# Patient Record
Sex: Male | Born: 1980 | Race: White | Hispanic: No | Marital: Married | Smoking: Never smoker
Health system: Southern US, Community
[De-identification: ages and names within clinical notes are randomized; demographics above are authoritative.]

## PROBLEM LIST (undated history)

## (undated) DIAGNOSIS — H539 Unspecified visual disturbance: Secondary | ICD-10-CM

## (undated) DIAGNOSIS — I498 Other specified cardiac arrhythmias: Secondary | ICD-10-CM

## (undated) DIAGNOSIS — R55 Syncope and collapse: Secondary | ICD-10-CM

## (undated) DIAGNOSIS — C6290 Malignant neoplasm of unspecified testis, unspecified whether descended or undescended: Secondary | ICD-10-CM

## (undated) DIAGNOSIS — S060XAA Concussion with loss of consciousness status unknown, initial encounter: Secondary | ICD-10-CM

## (undated) DIAGNOSIS — R109 Unspecified abdominal pain: Secondary | ICD-10-CM

## (undated) DIAGNOSIS — G90A Postural orthostatic tachycardia syndrome (POTS): Secondary | ICD-10-CM

## (undated) DIAGNOSIS — R569 Unspecified convulsions: Secondary | ICD-10-CM

## (undated) DIAGNOSIS — R110 Nausea: Secondary | ICD-10-CM

## (undated) DIAGNOSIS — G971 Other reaction to spinal and lumbar puncture: Secondary | ICD-10-CM

## (undated) DIAGNOSIS — R200 Anesthesia of skin: Secondary | ICD-10-CM

## (undated) DIAGNOSIS — I959 Hypotension, unspecified: Secondary | ICD-10-CM

## (undated) DIAGNOSIS — A879 Viral meningitis, unspecified: Secondary | ICD-10-CM

## (undated) DIAGNOSIS — S060X9A Concussion with loss of consciousness of unspecified duration, initial encounter: Secondary | ICD-10-CM

## (undated) DIAGNOSIS — G43909 Migraine, unspecified, not intractable, without status migrainosus: Secondary | ICD-10-CM

## (undated) HISTORY — DX: Unspecified abdominal pain: R10.9

## (undated) HISTORY — PX: ORCHIECTOMY: SHX2116

## (undated) HISTORY — DX: Postural orthostatic tachycardia syndrome (POTS): G90.A

## (undated) HISTORY — PX: ROTATOR CUFF REPAIR: SHX139

## (undated) HISTORY — DX: Other specified cardiac arrhythmias: I49.8

---
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
## 2000-12-23 ENCOUNTER — Emergency Department (HOSPITAL_COMMUNITY): Admission: EM | Admit: 2000-12-23 | Discharge: 2000-12-23 | Payer: Self-pay | Admitting: Emergency Medicine

## 2004-01-21 ENCOUNTER — Emergency Department (HOSPITAL_COMMUNITY): Admission: EM | Admit: 2004-01-21 | Discharge: 2004-01-21 | Payer: Self-pay | Admitting: Family Medicine

## 2004-04-05 ENCOUNTER — Encounter: Admission: RE | Admit: 2004-04-05 | Discharge: 2004-04-05 | Payer: Self-pay | Admitting: Endocrinology

## 2004-09-02 DIAGNOSIS — C6290 Malignant neoplasm of unspecified testis, unspecified whether descended or undescended: Secondary | ICD-10-CM

## 2004-09-02 HISTORY — PX: SURGERY SCROTAL / TESTICULAR: SUR1316

## 2004-09-02 HISTORY — DX: Malignant neoplasm of unspecified testis, unspecified whether descended or undescended: C62.90

## 2004-11-12 ENCOUNTER — Ambulatory Visit (HOSPITAL_BASED_OUTPATIENT_CLINIC_OR_DEPARTMENT_OTHER): Admission: RE | Admit: 2004-11-12 | Discharge: 2004-11-12 | Payer: Self-pay | Admitting: Urology

## 2004-11-12 ENCOUNTER — Encounter (INDEPENDENT_AMBULATORY_CARE_PROVIDER_SITE_OTHER): Payer: Self-pay | Admitting: *Deleted

## 2004-11-16 ENCOUNTER — Ambulatory Visit: Admission: RE | Admit: 2004-11-16 | Discharge: 2005-01-17 | Payer: Self-pay | Admitting: Radiation Oncology

## 2004-12-04 ENCOUNTER — Ambulatory Visit: Payer: Self-pay | Admitting: Hematology & Oncology

## 2005-02-07 ENCOUNTER — Ambulatory Visit: Admission: RE | Admit: 2005-02-07 | Discharge: 2005-02-07 | Payer: Self-pay | Admitting: Radiation Oncology

## 2005-02-08 ENCOUNTER — Ambulatory Visit (HOSPITAL_COMMUNITY): Admission: RE | Admit: 2005-02-08 | Discharge: 2005-02-08 | Payer: Self-pay | Admitting: Urology

## 2005-04-08 ENCOUNTER — Ambulatory Visit (HOSPITAL_COMMUNITY): Admission: RE | Admit: 2005-04-08 | Discharge: 2005-04-08 | Payer: Self-pay | Admitting: Urology

## 2005-04-08 ENCOUNTER — Ambulatory Visit (HOSPITAL_BASED_OUTPATIENT_CLINIC_OR_DEPARTMENT_OTHER): Admission: RE | Admit: 2005-04-08 | Discharge: 2005-04-08 | Payer: Self-pay | Admitting: Urology

## 2005-05-18 ENCOUNTER — Emergency Department (HOSPITAL_COMMUNITY): Admission: EM | Admit: 2005-05-18 | Discharge: 2005-05-18 | Payer: Self-pay | Admitting: Emergency Medicine

## 2005-06-10 ENCOUNTER — Ambulatory Visit (HOSPITAL_COMMUNITY): Admission: RE | Admit: 2005-06-10 | Discharge: 2005-06-10 | Payer: Self-pay | Admitting: Urology

## 2005-08-20 ENCOUNTER — Ambulatory Visit (HOSPITAL_COMMUNITY): Admission: RE | Admit: 2005-08-20 | Discharge: 2005-08-20 | Payer: Self-pay | Admitting: Urology

## 2005-09-26 ENCOUNTER — Ambulatory Visit (HOSPITAL_COMMUNITY): Admission: RE | Admit: 2005-09-26 | Discharge: 2005-09-26 | Payer: Self-pay | Admitting: Urology

## 2005-10-30 ENCOUNTER — Ambulatory Visit (HOSPITAL_BASED_OUTPATIENT_CLINIC_OR_DEPARTMENT_OTHER): Admission: RE | Admit: 2005-10-30 | Discharge: 2005-10-30 | Payer: Self-pay | Admitting: Urology

## 2006-01-24 ENCOUNTER — Encounter: Admission: RE | Admit: 2006-01-24 | Discharge: 2006-01-24 | Payer: Self-pay | Admitting: Gastroenterology

## 2006-02-19 ENCOUNTER — Ambulatory Visit (HOSPITAL_COMMUNITY): Admission: RE | Admit: 2006-02-19 | Discharge: 2006-02-19 | Payer: Self-pay | Admitting: Urology

## 2006-02-26 ENCOUNTER — Ambulatory Visit: Payer: Self-pay | Admitting: Oncology

## 2006-03-07 LAB — CBC WITH DIFFERENTIAL/PLATELET
BASO%: 0.8 % (ref 0.0–2.0)
Basophils Absolute: 0 10*3/uL (ref 0.0–0.1)
EOS%: 7.3 % — ABNORMAL HIGH (ref 0.0–7.0)
Eosinophils Absolute: 0.3 10*3/uL (ref 0.0–0.5)
HCT: 42.1 % (ref 38.7–49.9)
HGB: 14.5 g/dL (ref 13.0–17.1)
LYMPH%: 27.6 % (ref 14.0–48.0)
MCH: 30.6 pg (ref 28.0–33.4)
MCHC: 34.4 g/dL (ref 32.0–35.9)
MCV: 88.9 fL (ref 81.6–98.0)
MONO#: 0.4 10*3/uL (ref 0.1–0.9)
MONO%: 8.2 % (ref 0.0–13.0)
NEUT#: 2.5 10*3/uL (ref 1.5–6.5)
NEUT%: 56.1 % (ref 40.0–75.0)
Platelets: 255 10*3/uL (ref 145–400)
RBC: 4.74 10*6/uL (ref 4.20–5.71)
RDW: 12.9 % (ref 11.2–14.6)
WBC: 4.4 10*3/uL (ref 4.0–10.0)
lymph#: 1.2 10*3/uL (ref 0.9–3.3)

## 2006-03-07 LAB — COMPREHENSIVE METABOLIC PANEL
ALT: 22 U/L (ref 0–40)
AST: 17 U/L (ref 0–37)
Albumin: 4.5 g/dL (ref 3.5–5.2)
Alkaline Phosphatase: 68 U/L (ref 39–117)
BUN: 24 mg/dL — ABNORMAL HIGH (ref 6–23)
CO2: 27 mEq/L (ref 19–32)
Calcium: 9.3 mg/dL (ref 8.4–10.5)
Chloride: 103 mEq/L (ref 96–112)
Creatinine, Ser: 0.96 mg/dL (ref 0.40–1.50)
Glucose, Bld: 83 mg/dL (ref 70–99)
Potassium: 4.4 mEq/L (ref 3.5–5.3)
Sodium: 138 mEq/L (ref 135–145)
Total Bilirubin: 0.8 mg/dL (ref 0.3–1.2)
Total Protein: 7.1 g/dL (ref 6.0–8.3)

## 2006-03-07 LAB — LACTATE DEHYDROGENASE: LDH: 111 U/L (ref 94–250)

## 2006-03-10 ENCOUNTER — Ambulatory Visit (HOSPITAL_COMMUNITY): Admission: RE | Admit: 2006-03-10 | Discharge: 2006-03-10 | Payer: Self-pay | Admitting: Oncology

## 2006-06-04 ENCOUNTER — Ambulatory Visit: Payer: Self-pay | Admitting: Oncology

## 2006-06-06 ENCOUNTER — Ambulatory Visit (HOSPITAL_COMMUNITY): Admission: RE | Admit: 2006-06-06 | Discharge: 2006-06-06 | Payer: Self-pay | Admitting: Oncology

## 2006-08-20 ENCOUNTER — Ambulatory Visit (HOSPITAL_COMMUNITY): Admission: RE | Admit: 2006-08-20 | Discharge: 2006-08-20 | Payer: Self-pay | Admitting: Oncology

## 2006-09-02 ENCOUNTER — Ambulatory Visit: Payer: Self-pay | Admitting: Oncology

## 2006-09-30 LAB — CBC WITH DIFFERENTIAL/PLATELET
BASO%: 0.4 % (ref 0.0–2.0)
Basophils Absolute: 0 10*3/uL (ref 0.0–0.1)
EOS%: 5.9 % (ref 0.0–7.0)
Eosinophils Absolute: 0.3 10*3/uL (ref 0.0–0.5)
HCT: 41.7 % (ref 38.7–49.9)
HGB: 14.1 g/dL (ref 13.0–17.1)
LYMPH%: 34.7 % (ref 14.0–48.0)
MCH: 30.4 pg (ref 28.0–33.4)
MCHC: 33.9 g/dL (ref 32.0–35.9)
MCV: 89.8 fL (ref 81.6–98.0)
MONO#: 0.4 10*3/uL (ref 0.1–0.9)
MONO%: 6.8 % (ref 0.0–13.0)
NEUT#: 2.8 10*3/uL (ref 1.5–6.5)
NEUT%: 52.2 % (ref 40.0–75.0)
Platelets: 223 10*3/uL (ref 145–400)
RBC: 4.64 10*6/uL (ref 4.20–5.71)
RDW: 12.7 % (ref 11.2–14.6)
WBC: 5.3 10*3/uL (ref 4.0–10.0)
lymph#: 1.8 10*3/uL (ref 0.9–3.3)

## 2006-10-03 LAB — COMPREHENSIVE METABOLIC PANEL
ALT: 21 U/L (ref 0–53)
AST: 16 U/L (ref 0–37)
Albumin: 4.6 g/dL (ref 3.5–5.2)
Alkaline Phosphatase: 62 U/L (ref 39–117)
BUN: 21 mg/dL (ref 6–23)
CO2: 27 mEq/L (ref 19–32)
Calcium: 9.1 mg/dL (ref 8.4–10.5)
Chloride: 101 mEq/L (ref 96–112)
Creatinine, Ser: 1.24 mg/dL (ref 0.40–1.50)
Glucose, Bld: 78 mg/dL (ref 70–99)
Potassium: 4 mEq/L (ref 3.5–5.3)
Sodium: 135 mEq/L (ref 135–145)
Total Bilirubin: 0.9 mg/dL (ref 0.3–1.2)
Total Protein: 6.8 g/dL (ref 6.0–8.3)

## 2006-10-03 LAB — BETA HCG QUANT (REF LAB): Beta hCG, Tumor Marker: <0.5 m[IU]/mL (ref <5.0)

## 2006-10-03 LAB — LACTATE DEHYDROGENASE: LDH: 109 U/L (ref 94–250)

## 2006-10-07 ENCOUNTER — Ambulatory Visit: Payer: Self-pay | Admitting: Oncology

## 2007-08-13 ENCOUNTER — Ambulatory Visit: Payer: Self-pay | Admitting: Oncology

## 2007-08-17 LAB — CBC WITH DIFFERENTIAL/PLATELET
BASO%: 1 % (ref 0.0–2.0)
Basophils Absolute: 0.1 10*3/uL (ref 0.0–0.1)
EOS%: 4.8 % (ref 0.0–7.0)
Eosinophils Absolute: 0.3 10*3/uL (ref 0.0–0.5)
HCT: 39.1 % (ref 38.7–49.9)
HGB: 13.7 g/dL (ref 13.0–17.1)
LYMPH%: 29.8 % (ref 14.0–48.0)
MCH: 30.6 pg (ref 28.0–33.4)
MCHC: 35 g/dL (ref 32.0–35.9)
MCV: 87.6 fL (ref 81.6–98.0)
MONO#: 0.3 10*3/uL (ref 0.1–0.9)
MONO%: 6.1 % (ref 0.0–13.0)
NEUT#: 3.3 10*3/uL (ref 1.5–6.5)
NEUT%: 58.3 % (ref 40.0–75.0)
Platelets: 239 10*3/uL (ref 145–400)
RBC: 4.46 10*6/uL (ref 4.20–5.71)
RDW: 12.9 % (ref 11.2–14.6)
WBC: 5.6 10*3/uL (ref 4.0–10.0)
lymph#: 1.7 10*3/uL (ref 0.9–3.3)

## 2007-08-19 LAB — COMPREHENSIVE METABOLIC PANEL
ALT: 16 U/L (ref 0–53)
AST: 16 U/L (ref 0–37)
Albumin: 4.5 g/dL (ref 3.5–5.2)
Alkaline Phosphatase: 61 U/L (ref 39–117)
BUN: 23 mg/dL (ref 6–23)
CO2: 27 mEq/L (ref 19–32)
Calcium: 9.3 mg/dL (ref 8.4–10.5)
Chloride: 103 mEq/L (ref 96–112)
Creatinine, Ser: 0.87 mg/dL (ref 0.40–1.50)
Glucose, Bld: 118 mg/dL — ABNORMAL HIGH (ref 70–99)
Potassium: 4.1 mEq/L (ref 3.5–5.3)
Sodium: 140 mEq/L (ref 135–145)
Total Bilirubin: 0.7 mg/dL (ref 0.3–1.2)
Total Protein: 7 g/dL (ref 6.0–8.3)

## 2007-08-19 LAB — LACTATE DEHYDROGENASE: LDH: 108 U/L (ref 94–250)

## 2007-08-19 LAB — BETA HCG QUANT (REF LAB): Beta hCG, Tumor Marker: <0.5 m[IU]/mL (ref <5.0)

## 2007-08-24 ENCOUNTER — Ambulatory Visit (HOSPITAL_COMMUNITY): Admission: RE | Admit: 2007-08-24 | Discharge: 2007-08-24 | Payer: Self-pay | Admitting: Oncology

## 2008-07-21 ENCOUNTER — Ambulatory Visit: Payer: Self-pay | Admitting: Oncology

## 2008-07-21 ENCOUNTER — Ambulatory Visit (HOSPITAL_COMMUNITY): Admission: RE | Admit: 2008-07-21 | Discharge: 2008-07-21 | Payer: Self-pay | Admitting: Oncology

## 2008-07-21 LAB — COMPREHENSIVE METABOLIC PANEL
ALT: 25 U/L (ref 0–53)
AST: 21 U/L (ref 0–37)
Albumin: 4.5 g/dL (ref 3.5–5.2)
Alkaline Phosphatase: 60 U/L (ref 39–117)
BUN: 22 mg/dL (ref 6–23)
CO2: 30 mEq/L (ref 19–32)
Calcium: 9.3 mg/dL (ref 8.4–10.5)
Chloride: 101 mEq/L (ref 96–112)
Creatinine, Ser: 0.89 mg/dL (ref 0.40–1.50)
Glucose, Bld: 100 mg/dL — ABNORMAL HIGH (ref 70–99)
Potassium: 3.9 mEq/L (ref 3.5–5.3)
Sodium: 138 mEq/L (ref 135–145)
Total Bilirubin: 1.1 mg/dL (ref 0.3–1.2)
Total Protein: 7.2 g/dL (ref 6.0–8.3)

## 2008-07-21 LAB — CBC WITH DIFFERENTIAL/PLATELET
BASO%: 0.4 % (ref 0.0–2.0)
Basophils Absolute: 0 10*3/uL (ref 0.0–0.1)
EOS%: 4.3 % (ref 0.0–7.0)
Eosinophils Absolute: 0.2 10*3/uL (ref 0.0–0.5)
HCT: 43 % (ref 38.7–49.9)
HGB: 14.6 g/dL (ref 13.0–17.1)
LYMPH%: 30.6 % (ref 14.0–48.0)
MCH: 30.4 pg (ref 28.0–33.4)
MCHC: 34 g/dL (ref 32.0–35.9)
MCV: 89.3 fL (ref 81.6–98.0)
MONO#: 0.4 10*3/uL (ref 0.1–0.9)
MONO%: 6.4 % (ref 0.0–13.0)
NEUT#: 3.3 10*3/uL (ref 1.5–6.5)
NEUT%: 58.3 % (ref 40.0–75.0)
Platelets: 234 10*3/uL (ref 145–400)
RBC: 4.81 10*6/uL (ref 4.20–5.71)
RDW: 13 % (ref 11.2–14.6)
WBC: 5.7 10*3/uL (ref 4.0–10.0)
lymph#: 1.7 10*3/uL (ref 0.9–3.3)

## 2008-07-21 LAB — LACTATE DEHYDROGENASE: LDH: 105 U/L (ref 94–250)

## 2008-07-25 LAB — BETA HCG QUANT (REF LAB): Beta hCG, Tumor Marker: <0.5 m[IU]/mL (ref <5.0)

## 2010-05-09 ENCOUNTER — Ambulatory Visit (HOSPITAL_COMMUNITY): Admission: RE | Admit: 2010-05-09 | Discharge: 2010-05-09 | Payer: Self-pay | Admitting: Oncology

## 2010-05-09 ENCOUNTER — Ambulatory Visit: Payer: Self-pay | Admitting: Oncology

## 2010-05-09 LAB — CBC WITH DIFFERENTIAL/PLATELET
BASO%: 0.6 % (ref 0.0–2.0)
Basophils Absolute: 0 10*3/uL (ref 0.0–0.1)
EOS%: 3.9 % (ref 0.0–7.0)
Eosinophils Absolute: 0.2 10*3/uL (ref 0.0–0.5)
HCT: 43.8 % (ref 38.4–49.9)
HGB: 14.6 g/dL (ref 13.0–17.1)
LYMPH%: 28.6 % (ref 14.0–49.0)
MCH: 29.9 pg (ref 27.2–33.4)
MCHC: 33.3 g/dL (ref 32.0–36.0)
MCV: 89.6 fL (ref 79.3–98.0)
MONO#: 0.3 10*3/uL (ref 0.1–0.9)
MONO%: 5.9 % (ref 0.0–14.0)
NEUT#: 3.5 10*3/uL (ref 1.5–6.5)
NEUT%: 61 % (ref 39.0–75.0)
Platelets: 257 10*3/uL (ref 140–400)
RBC: 4.89 10*6/uL (ref 4.20–5.82)
RDW: 13.3 % (ref 11.0–14.6)
WBC: 5.7 10*3/uL (ref 4.0–10.3)
lymph#: 1.6 10*3/uL (ref 0.9–3.3)

## 2010-05-11 LAB — COMPREHENSIVE METABOLIC PANEL
ALT: 22 U/L (ref 0–53)
AST: 16 U/L (ref 0–37)
Albumin: 4.5 g/dL (ref 3.5–5.2)
Alkaline Phosphatase: 76 U/L (ref 39–117)
BUN: 21 mg/dL (ref 6–23)
CO2: 30 mEq/L (ref 19–32)
Calcium: 9.5 mg/dL (ref 8.4–10.5)
Chloride: 102 mEq/L (ref 96–112)
Creatinine, Ser: 0.99 mg/dL (ref 0.40–1.50)
Glucose, Bld: 64 mg/dL — ABNORMAL LOW (ref 70–99)
Potassium: 4.5 mEq/L (ref 3.5–5.3)
Sodium: 139 mEq/L (ref 135–145)
Total Bilirubin: 1.1 mg/dL (ref 0.3–1.2)
Total Protein: 6.9 g/dL (ref 6.0–8.3)

## 2010-05-11 LAB — BETA HCG QUANT (REF LAB): Beta hCG, Tumor Marker: <0.5 m[IU]/mL (ref <5.0)

## 2010-05-11 LAB — LACTATE DEHYDROGENASE: LDH: 121 U/L (ref 94–250)

## 2010-09-23 ENCOUNTER — Encounter: Payer: Self-pay | Admitting: Family Medicine

## 2010-09-23 ENCOUNTER — Encounter: Payer: Self-pay | Admitting: Oncology

## 2011-01-18 NOTE — Op Note (Signed)
NAME:  Manuel [name], Manuel [name]              ACCOUNT NO.:  [id number]   MEDICAL RECORD NO.:  [id number]          PATIENT TYPE:  AMB   LOCATION:  [facility]                         FACILITY:  [facility]   PHYSICIAN:  Valetta Fuller, M.D.  DATE OF BIRTH:  04-Oct-1980   DATE OF PROCEDURE:  11/12/2004  DATE OF DISCHARGE:                                 OPERATIVE REPORT   PREOPERATIVE DIAGNOSIS:  Left testicular tumor.   POSTOPERATIVE DIAGNOSIS:  Left testicular tumor.   PROCEDURE PERFORMED:  Left radical orchiectomy.   SURGEON:  Valetta Fuller, M.D.   ASSISTANT:  Thyra Breed, MD   ANESTHESIA:  General.   INDICATIONS:  Mr. Furey is a 30 year old male.  He has been seen in our  office with other issues, but presented recently with approximately a 4-6  week history of some intermittent testicular achiness and soreness.  He had  no other systemic complaints, and no voiding symptoms.  His urinalysis was  clear, and clinical examination revealed a testicle that was fairly normal  size; without a discrete mass.  But, the consistency of the testicle was  quite hard and clearly abnormal.  Scrotal ultrasound was done, which showed  what appeared to be a relatively well circumscribed mass essentially  replacing the great majority of the testicle; with only a thin rim of what  appeared to be normal testicular parenchyma.  Alpha fetoprotein was not  elevated, and beta HCG was very minimally elevated.   The patient was flet to have a high likelihood of a germ cell neoplasm of  the testis.  We recommended left radical orchiectomy, given the severity of  the abnormalities on clinical examination; as well as on ultrasound.   The patient is scheduled for imaging studies, with a CT scan of the abdomen,  pelvis and chest in the next few days.  That advice was given to the patient  and his family, and accepted.  Full informed consent was obtained.  The  patient also was told the pros and cons of testicular prosthesis  insertion,  and elected to have that done as well.   TECHNIQUE AND FINDINGS:  The patient was brought to the operating room,  where he had successful induction of general anesthesia.  He was placed in  the supine position and prepped and draped in the usual manner.  A standard small left anal incision was made.  Electrocautery was used to  dissect down to the external oblique fascia, where the external ring was  easily identified.  The external oblique was then opened in the direction of  its fibers.  The underlying nerve was identified and preserved.  Spermatic  cord appeared otherwise unremarkable.  At the level of the pubic tubercle it  was encircled with a Penrose.  We then dissected back proximally to the  internal ring, where the Penrose drain was doubly wrapped around the  spermatic cord and occluded.  The scrotum was then everted and the testicle  was freed from gubernacular attachments.  Again, the testis itself was  markedly hard without obvious palpable discrete mass per se.  We  did not  feel that additional exploration or biopsy of the testicle was indicated,  and felt that it was best to leave the coverings over the testicle and  simply remove it.  We traced the spermatic cord down to the internal ring.  There it was taken in two bundles, with silk ligature and tie.  The silk  suture was left  long for possible identification at a later date.  The  inguinal wound was copiously irrigated.  The external oblique was closed  with a running Vicryl suture.   We then went ahead and placed a medium size saline prosthesis, which filled  in the standard manner.  It was anchored to the dependent portion of the  scrotum, utilizing the PDS suture.  Again, copious irrigation was performed.  Subcutaneous tissues, including Scarpa's fascia, were then closed with a  running Vicryl.  The skin was closed with a subcuticular and Dermabond.   The patient appeared to tolerate the procedure and  there were no obvious  complications.      DSG/MEDQ  D:  11/13/2004  T:  11/13/2004  Job:  [id number]

## 2011-01-18 NOTE — Op Note (Signed)
NAME:  Manuel [name], WYSS              ACCOUNT NO.:  [id number]   MEDICAL RECORD NO.:  [id number]          PATIENT TYPE:  AMB   LOCATION:  [facility]                         FACILITY:  [facility]   PHYSICIAN:  Valetta Fuller, M.D.  DATE OF BIRTH:  October 10, 1980   DATE OF PROCEDURE:  10/30/2005  DATE OF DISCHARGE:                                 OPERATIVE REPORT   PREOPERATIVE DIAGNOSIS:  Request for removal of testicular prosthesis.   POSTOPERATIVE DIAGNOSIS:  Request for removal of testicular prosthesis.   PROCEDURE PERFORMED:  Removal of testicular prosthesis.   INDICATIONS:  Mr. Phenix is a 30 year old male.  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  At that time, a testicular  prosthesis was inserted.  In August, 2006, this was repositioned because of  the high-riding nature of the prosthesis.  The patient came in recently  asking that the prosthesis be removed.  He has had no major problems with  the prosthesis but finds it to be somewhat uncomfortable when he is active  with hiking and mountain biking.  At this point, he feels like mentally he  does not require a prosthesis and would just as soon have it removed.  He is  having no other clinical difficulties, and there is no evidence of infection  of the prosthesis.   TECHNIQUE/FINDINGS:  Patient was brought to the operating room.  He received  some IV sedation.  He was prepped and draped in the usual manner, and the  median raphe of the scrotum was infiltrated with the mixture of lidocaine  and Marcaine.  A small incision was made, and the left hemiscrotal  compartment was opened.  The prosthesis was noted to be in good position  without evidence of infection, leakage, or any other problems.  It was  removed.  The scrotal skin was closed with several layers of Vicryl suture.  The patient appeared to tolerate the procedure well.  There were no obvious  complications.           ______________________________  Valetta Fuller, M.D.  Electronically Signed     DSG/MEDQ  D:  10/31/2005  T:  10/31/2005  Job:  [id number]

## 2011-01-18 NOTE — Op Note (Signed)
NAME:  Manuel [name], Manuel [name]              ACCOUNT NO.:  [id number]   MEDICAL RECORD NO.:  [id number]          PATIENT TYPE:  AMB   LOCATION:  [facility]                         FACILITY:  [facility]   PHYSICIAN:  Valetta Fuller, M.D.  DATE OF BIRTH:  1980-10-30   DATE OF PROCEDURE:  04/08/2005  DATE OF DISCHARGE:                                 OPERATIVE REPORT   PREOPERATIVE DIAGNOSES:  1.  Malposition testicular prosthesis.  2.  History of testicular cancer.   POSTOPERATIVE DIAGNOSES:  1.  Malposition testicular prosthesis.  2.  History of testicular cancer.   PROCEDURE PERFORMED:  Scrotal exploration with repositioning of testicular  prosthesis.   SURGEON:  Valetta Fuller, M.D.   ANESTHESIA:  General.   INDICATIONS:  [name] is a 30 year old male. In March of this year, he  was diagnosed with seminoma. The patient subsequently underwent a radical  orchiectomy. He also had placement of a testicular prosthesis. This was on  the left side. The patient so far has had negative follow-ups. 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 His recent follow-up included marker studies and chest x-  ray which were unremarkable. He has had no major problems from his  testicular prosthesis on the left but admits that at times with erectile  activity, the prosthesis tends to ride up high into the scrotum and even  goes to the base of his penis which bothers him. On clinical exam, there was  no evidence of infection and the prosthesis appeared to be in the dependent  portion of the scrotum but there did appear to be some scar like attachments  to the top of the prosthesis which seemed to restrict its dependent nature  and caused it to rude up into the penile scrotal junction. We offered him  the possibility of trying to reposition this or at least to cut some of  these adhesive bands to see if we could get this to ride down more in the  dependent portion of the scrotum. He wanted this done. He appeared to  understand the advantages and disadvantages and the increased risk  potentially of causing infection.   TECHNIQUE AND FINDINGS:  The patient was brought to the operating room where  he had successful induction of general anesthesia. He was prepped and draped  in the usual manner using Hibiclens since he does have an IODINE allergy. On  clinical exam, the prosthesis appeared to be in the dependent portion of the  scrotum but there was by exam some scar probably within the spermatic cord  that was retracting the testicle up into the penile scrotal junction to some  degree. I ended up making a small horizontal incision in the upper aspect of  the scrotum just above the prosthesis. We were able to cut through some of  these adhesive bands and that allowed the testicular prosthesis to ride down  in a more dependent portion. We did open up the tunica vaginalis and using a  little pursestring suture  I was able to close  that above the prosthesis to  help hold it down into a more dependent position. We then closed the little  small 1-2 cm scrotal incision utilizing some interrupted Vicryl suture.  Since it was opened horizontally, I  closed it vertically to again try to extend the length a little bit. The  patient appeared to tolerate the procedure well. The scrotal wound was  copiously irrigated with antibiotic solution. A little clear plastic  dressing was placed over the incision. The patient appeared to tolerate the  procedure well and there were no obvious complications.       DSG/MEDQ  D:  04/08/2005  T:  04/08/2005  Job:  [id number]

## 2011-05-13 ENCOUNTER — Encounter (HOSPITAL_BASED_OUTPATIENT_CLINIC_OR_DEPARTMENT_OTHER): Payer: BC Managed Care – PPO | Admitting: Oncology

## 2011-05-13 DIAGNOSIS — Z9079 Acquired absence of other genital organ(s): Secondary | ICD-10-CM

## 2011-05-13 DIAGNOSIS — R109 Unspecified abdominal pain: Secondary | ICD-10-CM

## 2011-05-13 DIAGNOSIS — Z8547 Personal history of malignant neoplasm of testis: Secondary | ICD-10-CM

## 2011-09-18 ENCOUNTER — Ambulatory Visit
Admission: RE | Admit: 2011-09-18 | Discharge: 2011-09-18 | Disposition: A | Discharge status: Home / Self Care | Payer: BC Managed Care – PPO | Source: Ambulatory Visit | Attending: Family Medicine | Admitting: Family Medicine

## 2011-09-18 ENCOUNTER — Other Ambulatory Visit: Payer: Self-pay | Admitting: Family Medicine

## 2011-09-18 DIAGNOSIS — R05 Cough: Secondary | ICD-10-CM

## 2011-09-18 DIAGNOSIS — R059 Cough, unspecified: Secondary | ICD-10-CM

## 2011-09-18 DIAGNOSIS — R509 Fever, unspecified: Secondary | ICD-10-CM

## 2012-02-05 ENCOUNTER — Telehealth: Payer: Self-pay | Admitting: Oncology

## 2012-02-05 NOTE — Telephone Encounter (Signed)
S/w the pt and he is aware of his sept f/u appt along with the cxr.

## 2012-04-23 ENCOUNTER — Observation Stay (HOSPITAL_COMMUNITY)
Admission: EM | Admit: 2012-04-23 | Discharge: 2012-04-24 | Disposition: A | Discharge status: Home / Self Care | Payer: BC Managed Care – PPO | Attending: Internal Medicine | Admitting: Internal Medicine

## 2012-04-23 ENCOUNTER — Emergency Department (HOSPITAL_COMMUNITY): Payer: BC Managed Care – PPO

## 2012-04-23 ENCOUNTER — Encounter (HOSPITAL_COMMUNITY): Payer: Self-pay | Admitting: Emergency Medicine

## 2012-04-23 DIAGNOSIS — Z8547 Personal history of malignant neoplasm of testis: Secondary | ICD-10-CM | POA: Insufficient documentation

## 2012-04-23 DIAGNOSIS — R51 Headache: Secondary | ICD-10-CM

## 2012-04-23 DIAGNOSIS — R55 Syncope and collapse: Principal | ICD-10-CM

## 2012-04-23 DIAGNOSIS — R202 Paresthesia of skin: Secondary | ICD-10-CM

## 2012-04-23 DIAGNOSIS — R200 Anesthesia of skin: Secondary | ICD-10-CM | POA: Diagnosis present

## 2012-04-23 DIAGNOSIS — R209 Unspecified disturbances of skin sensation: Secondary | ICD-10-CM

## 2012-04-23 DIAGNOSIS — G43109 Migraine with aura, not intractable, without status migrainosus: Secondary | ICD-10-CM | POA: Insufficient documentation

## 2012-04-23 DIAGNOSIS — R404 Transient alteration of awareness: Secondary | ICD-10-CM | POA: Insufficient documentation

## 2012-04-23 HISTORY — DX: Concussion with loss of consciousness of unspecified duration, initial encounter: S06.0X9A

## 2012-04-23 HISTORY — DX: Malignant neoplasm of unspecified testis, unspecified whether descended or undescended: C62.90

## 2012-04-23 HISTORY — DX: Concussion with loss of consciousness status unknown, initial encounter: S06.0XAA

## 2012-04-23 LAB — BASIC METABOLIC PANEL
BUN: 19 mg/dL (ref 6–23)
CO2: 27 mEq/L (ref 19–32)
Calcium: 9.8 mg/dL (ref 8.4–10.5)
Chloride: 105 mEq/L (ref 96–112)
Creatinine, Ser: 0.9 mg/dL (ref 0.50–1.35)
GFR calc Af Amer: >90 mL/min (ref >90)
GFR calc non Af Amer: >90 mL/min (ref >90)
Glucose, Bld: 95 mg/dL (ref 70–99)
Potassium: 3.9 mEq/L (ref 3.5–5.1)
Sodium: 141 mEq/L (ref 135–145)

## 2012-04-23 LAB — CBC WITH DIFFERENTIAL/PLATELET
Basophils Absolute: 0.1 10*3/uL (ref 0.0–0.1)
Basophils Relative: 1 % (ref 0–1)
Eosinophils Absolute: 0.2 10*3/uL (ref 0.0–0.7)
Eosinophils Relative: 3 % (ref 0–5)
HCT: 41.8 % (ref 39.0–52.0)
Hemoglobin: 14.2 g/dL (ref 13.0–17.0)
Lymphocytes Relative: 30 % (ref 12–46)
Lymphs Abs: 1.9 10*3/uL (ref 0.7–4.0)
MCH: 29.4 pg (ref 26.0–34.0)
MCHC: 34 g/dL (ref 30.0–36.0)
MCV: 86.5 fL (ref 78.0–100.0)
Monocytes Absolute: 0.4 10*3/uL (ref 0.1–1.0)
Monocytes Relative: 7 % (ref 3–12)
Neutro Abs: 3.8 10*3/uL (ref 1.7–7.7)
Neutrophils Relative %: 59 % (ref 43–77)
Platelets: 234 10*3/uL (ref 150–400)
RBC: 4.83 MIL/uL (ref 4.22–5.81)
RDW: 12.6 % (ref 11.5–15.5)
WBC: 6.5 10*3/uL (ref 4.0–10.5)

## 2012-04-23 LAB — LIPID PANEL
Cholesterol: 211 mg/dL — ABNORMAL HIGH (ref 0–200)
HDL: 74 mg/dL (ref >39)
LDL Cholesterol: 129 mg/dL — ABNORMAL HIGH (ref 0–99)
Total CHOL/HDL Ratio: 2.9 RATIO
Triglycerides: 39 mg/dL (ref <150)
VLDL: 8 mg/dL (ref 0–40)

## 2012-04-23 LAB — APTT: aPTT: 29 seconds (ref 24–37)

## 2012-04-23 LAB — GLUCOSE, CAPILLARY: Glucose-Capillary: 88 mg/dL (ref 70–99)

## 2012-04-23 MED ORDER — SODIUM CHLORIDE 0.9 % IJ SOLN: 3.0000 mL | INTRAMUSCULAR | Status: DC

## 2012-04-23 MED ORDER — ASPIRIN 325 MG PO TABS
325.0000 mg | ORAL_TABLET | Freq: Every day | ORAL | Status: DC
  Administered 2012-04-23 – 2012-04-24 (×2): 325 mg via ORAL
  Filled 2012-04-23 (×2): qty 1

## 2012-04-23 MED ORDER — HEPARIN SODIUM (PORCINE) 5000 UNIT/ML IJ SOLN
5000.0000 [IU] | Freq: Three times a day (TID) | INTRAMUSCULAR | Status: DC
  Filled 2012-04-23: qty 1

## 2012-04-23 MED ORDER — SODIUM CHLORIDE 0.9 % IV SOLN: INTRAVENOUS | Status: DC

## 2012-04-23 NOTE — ED Notes (Signed)
Pt reports last seen normal around 1030 am 04/23/12.

## 2012-04-23 NOTE — ED Provider Notes (Signed)
Obtained H&P also.  Agree with resident's assessment and plan.  Tobin Chad, MD 04/23/12 2257

## 2012-04-23 NOTE — ED Provider Notes (Signed)
History     CSN: [id number]  Arrival date & time 04/23/12  Manuel [name]   First MD Initiated Contact with Patient 04/23/12 1924      Chief Complaint  Patient presents with  . Loss of Consciousness    (Consider location/radiation/quality/duration/timing/severity/associated sxs/prior treatment) HPI This is a 31 year old, previously healthy gentleman, who presents with feeling of weakness in the left upper and lower extremities. He reports that he was not feeling the reading on his left ring finger and had similar loss of sensation with the left little finger. This feeling of numbness extends all the way up to his left shoulder and to the left side of his face. He also reports that his left foot was feeling unusually numb. The symptoms started about 2 and half hours ago.He also reports history of feeling funny and confused. At around 11 AM today he started feeling what he describes as a splitting headache on the right temporal area - 'just in one spot'. He describes having some difficulty with his vision with failure to recognizes images accurately which happened when he was driving from work to home. But that symptom has now resolved. According to his wife, he was acting unusual and that he passed out after he had safely driven home. There is no history of vomiting, but he felt like throwing up. The patient reports that he has a history of reactive hypoglycemia, but he took his blood sugar at home and it was 88. The mother and a wife reported that the patient's speech is reduced because he is usually very expressive. There is no history of a fall since the onset of the symptoms. The patient was feeling normal today in the morning with absolutely no symptoms. There is no history of fever or chills.   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 He was declared free of cancer after treatment that lasted 3months.    Past Medical History  Diagnosis Date  . Testicular cancer 2006  . Concussion     pt reports he has hx of multiple concussions, last one was 2009    Past Surgical History  Procedure Date  . Surgery scrotal / testicular 2006    History reviewed. No pertinent family history.  History  Substance Use Topics  . Smoking status: Never Smoker   . Smokeless tobacco: Not on file  . Alcohol Use: Yes     occainsionally      Review of Systems  Constitutional: Positive for diaphoresis. Negative for fever, chills, activity change, appetite change, fatigue and unexpected weight change.  HENT: Negative for hearing loss, ear pain, nosebleeds, congestion, facial swelling, neck pain, neck stiffness, tinnitus and ear discharge.   Eyes: Positive for photophobia and visual disturbance. Negative for pain, discharge, redness and itching.  Respiratory: Negative for apnea, cough, choking, chest tightness, shortness of breath, wheezing and stridor.   Gastrointestinal: Negative.   Genitourinary: Positive for frequency. Negative for dysuria, urgency, hematuria, flank pain, decreased urine volume, discharge, penile swelling, scrotal swelling, enuresis, difficulty urinating, genital sores, penile pain and testicular pain.  Musculoskeletal: Negative.   Neurological: Positive for dizziness, syncope and speech difficulty. Negative for tremors, seizures, facial asymmetry and weakness.  Hematological: Negative.   Psychiatric/Behavioral: Positive for confusion and decreased concentration.    Allergies  Iodine; Erythromycin; and Shellfish allergy  Home Medications  No current outpatient prescriptions on file.  BP 113/64  Pulse 73  Temp  98.3 F (36.8 C)  Resp 16  SpO2 96%  Physical Exam  Constitutional: He is oriented to person, place, and time. He appears well-developed and well-nourished. No distress.  HENT:  Head: Normocephalic and atraumatic.  Eyes: Conjunctivae and EOM are normal. Pupils are  equal, round, and reactive to light.  Neck: Normal range of motion. Neck supple.  Cardiovascular: Normal rate, regular rhythm, normal heart sounds and intact distal pulses.  Exam reveals no gallop and no friction rub.   No murmur heard. Pulmonary/Chest: Effort normal and breath sounds normal. No respiratory distress. He has no wheezes. He has no rales. He exhibits no tenderness.  Abdominal: Soft. Bowel sounds are normal. He exhibits no distension and no mass. There is no tenderness. There is no rebound and no guarding.  Musculoskeletal: He exhibits no edema and no tenderness.  Neurological: He is alert and oriented to person, place, and time. He has normal reflexes. A sensory deficit is present. No cranial nerve deficit. Coordination normal. GCS eye subscore is 4. GCS verbal subscore is 5. GCS motor subscore is 6. He displays no Babinski's sign on the right side. He displays no Babinski's sign on the left side.       There is reduced level of sensation around the lateral side of his upper arm and forearm. Patient also reports that the sensation on his left side of the face is reduced. He could not hold his left leg straight as long as he did with the right.    Skin: Skin is warm and dry. He is not diaphoretic.  Psychiatric: He has a normal mood and affect.    ED Course  Procedures (including critical care time)   Labs Reviewed  APTT  CBC WITH DIFFERENTIAL  BASIC METABOLIC PANEL  GLUCOSE, CAPILLARY  LIPID PANEL  HEMOGLOBIN A1C   Ct Head Wo Contrast  04/23/2012  *RADIOLOGY REPORT*  Clinical Data:  Loss of consciousness.  Headache  CT HEAD WITHOUT CONTRAST  Technique:  Contiguous axial images were obtained from the base of the skull through the vertex without contrast  Comparison:  05/18/2005  Findings:  The brain has a normal appearance without evidence for hemorrhage, acute infarction, hydrocephalus, or mass lesion.  There is no extra axial fluid collection.  The skull and paranasal  sinuses are normal.  IMPRESSION: Normal CT of the head without contrast.   Original Report Authenticated By: Camelia Phenes, M.D.    Dg Chest Portable 1 View  04/23/2012  *RADIOLOGY REPORT*  Clinical Data: Loss of consciousness  PORTABLE CHEST - 1 VIEW  Comparison: 09/18/2011  Findings: Normal heart size.  Clear lungs.  No pneumothorax.  IMPRESSION: No active cardiopulmonary disease.   Original Report Authenticated By: Donavan Burnet, M.D.      No diagnosis found.    Date: 04/23/2012  Rate: 78  Rhythm: normal sinus rhythm  QRS Axis: normal  Intervals: normal  ST/T Wave abnormalities: nonspecific ST changes  Conduction Disutrbances:none  Narrative Interpretation:   Old EKG Reviewed: none available and changes noted   MDM  This young patient who presents with unilateral weakness and loss of sensation, with a history of headache, and visual changes, there is a some possibility of a stroke. He has a significant past medical history of testicular cancer treated with radiotherapy. Initial head CT scan without contrast has shown no evidence of intracranial bleed or tumor. Will require further evaluation with a neurologist given a high index of suspicion in a young patient. Neurology  has been consulted and they have recommended brain MRI, neck MRA, and MRV and bilateral, carotid duplex.. The patient will be admitted for observation in the CDU.      Dow Adolph, MD 04/23/12 2141

## 2012-04-23 NOTE — ED Provider Notes (Addendum)
I am evaluating [name] along with the resident.  I have also obtained an H&P.  By both hx and exam he does not meet criteria for tPA.  Still concerned secondary to his hx of neoplasm and ongoing unilateral paresthesias.  Discussed with Dr. Amada Jupiter (neurohospitalist).  Plan MRI brain, CTA head and neck.  Pt will likely be admitted for further evaluation.  2045 - Dr. Amada Jupiter is at the bedside.  Manuel Chad, MD 04/23/12 2046   Date: 04/23/2012  Rate: 78 bpm  Rhythm: normal sinus rhythm  QRS Axis: normal  Intervals: normal  ST/T Wave abnormalities: normal  Conduction Disutrbances:none  Narrative Interpretation:   Old EKG Reviewed: none available    Manuel Chad, MD 04/23/12 2047

## 2012-04-23 NOTE — ED Notes (Signed)
States he had some confusion, numb left side, syncopal episode. States symptoms started today. Complaining of continued headache. Located right frontal. Rates pain as 7/10. Constant, sharp, stabbing pain

## 2012-04-23 NOTE — ED Notes (Signed)
Per EMS - around 11am pt started to have right sided HA that progressively got worse. Came home from work was pale, diaphoretic, HA and nauseous, no vomiting. Pt's family reports pt passed out 3 times. Pt has had some uncontrollable upper body tremors. With EMS pt reported his left hand/arm became numb and starting cramping then pt felt like it was starting to move to right arm. 12-lead unremarkable. CBG 88. 18G in Left AC. A&Ox4. HR 100, BP 160/80. Pt reports he feels more comfortable with legs elevated.

## 2012-04-23 NOTE — Consult Note (Addendum)
Reason for Consult: Headache and left paresthesias Referring Physician: Dana Allan  CC: Left-sided numbness  History is obtained from: Patient, wife, mother  HPI: Manuel [name] is an 31 y.o. male with a history of testicular cancer that has been in remission for 5 years as well as reactive hypoglycemia who presents today with new onset right frontal headache as well as left-sided paresthesias. He states that the headache started this morning, and was unusual because he does not typically get headaches. He has had some mild nausea earlier today that has passed.  When he was driving home earlier, he states that he felt confused. He then had an episode (he had stopped driving at this point) where he felt like he "whited out." He also has some shaking movements and lost consciousness for a few seconds. Upon recovery, his wife states that he was not making sense. He was sat up, and again seemed to lose consciousness and had shaking movements. This is what prompted them to bring him to the emergency room tonight.  Since that time when he passed out, he has had persistent left-sided numbness involving his face arm and leg. He also felt like his arm and leg were weak earlier. He does feel like these symptoms are improving.  He does note occasional skipped beats. He denies recent neck trauma.  He does have a history of 2 concussions, both of which were more than 5 years ago.  ROS: An 11 point ROS was performed and is negative except as noted in the HPI.  Past Medical History  Diagnosis Date  . Testicular cancer 2006  . 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  Exam: Current vital signs: BP 113/64  Pulse 73  Temp 98.3 F (36.8 C)  Resp 16  SpO2 96% Vital signs in last 24 hours: Temp:  [98.3 F  (36.8 C)] 98.3 F (36.8 C) (08/22 2018) Pulse Rate:  [73-90] 73  (08/22 2030) Resp:  [13-20] 16  (08/22 2030) BP: (113-125)/(64-73) 113/64 mmHg (08/22 2030) SpO2:  [96 %-99 %] 96 % (08/22 2030)  General: Awake, in bed in ER CV: Regular rate and rhythm Mental Status: Patient is awake, alert, oriented to person, place, time, and situation. Patient is able to give a clear and coherent history. Cranial Nerves: II: Visual Fields are full. Pupils are equal, round, and reactive to light.  Discs are difficult to visualize. [name],IV, VI: EOMI without ptosis, diploplia on far leftward gaze without clear dysconjugation.  V,VII: Facial movement is symmetric. Facial sensation is mildly reduced to light touch on the left compared to right. VIII: hearing is intact to voice X: Uvula elevates symmetrically XI: Shoulder shrug is symmetric. XII: tongue is midline without atrophy or fasciculations.  Motor: Tone is normal.  Strength is listed on 5 point scale as (left, right): Shoulder Abduction (5, 5) Elbow flexion (5, 5) Elbow extension (5, 5) Interossei (5, 4+) No pronator drift  Hip flexion(5, 4) Knee flexion( 5, 4) Knee Extension(5, 4+) Ankle dorsiflexion(5, 5) Ankle plantarflexion(5, 5)  Sensory: Sensation is symmetric to temperature and vibration, but diminished slightly to light touch throughout the left side.  Deep Tendon Reflexes: 2+ and symmetric in the biceps, triceps, ankles and patellae.  Cerebellar: FNF is intact bilaterally Gait: Patient is able to rise and take a step  without assistance(limited only by ER monitors). Romberg is negative.   I have reviewed labs in epic and the results pertinent to this consultation are: Normal BMP, CBC, BG of 95  Problems addressed: Headache, paresthesias  Impression: 31 year old male with new onset right frontal headache and left paresthesias. The differential would include a right carotid dissection with subsequent left CVA, venous sinus  thrombosis, complicated migraine, seizure with subsequent Todd's paralysis.  The shaking described by his wife could be myoclonus associated with syncope or could be seizure activity. At this time, further workup needs to be undertaken. The former is suggested by repetition of the movements on sitting the patient up.   Recommendations: 1) MRI brain, MRA head and neck, MRV head tonight 2) EEG tomorrow in the a.m. if these are normal 3) Orthostatic vital signs.

## 2012-04-23 NOTE — ED Notes (Signed)
Pt reports he had a HA this morning continued throughout the day and got worse, all of a sudden he became nauseous and diaphoretic. Pt passed out X 3 at home, wife witnessed and reports it only lasted a few seconds and after each time he his upper body was shaking. Pt reports he then experienced some numbness and tingling in left hand that radiated up arm then into his right arm. Now pt reports his left arm is not as numb as before but it still feels "funny". Pt also reports he feels confused. Pt is A&Ox4.

## 2012-04-24 ENCOUNTER — Observation Stay (HOSPITAL_COMMUNITY): Payer: BC Managed Care – PPO

## 2012-04-24 ENCOUNTER — Encounter (HOSPITAL_COMMUNITY): Payer: Self-pay | Admitting: Internal Medicine

## 2012-04-24 DIAGNOSIS — R55 Syncope and collapse: Secondary | ICD-10-CM

## 2012-04-24 DIAGNOSIS — R200 Anesthesia of skin: Secondary | ICD-10-CM

## 2012-04-24 DIAGNOSIS — G459 Transient cerebral ischemic attack, unspecified: Secondary | ICD-10-CM

## 2012-04-24 DIAGNOSIS — R51 Headache: Secondary | ICD-10-CM

## 2012-04-24 HISTORY — DX: Syncope and collapse: R55

## 2012-04-24 HISTORY — DX: Anesthesia of skin: R20.0

## 2012-04-24 LAB — CBC WITH DIFFERENTIAL/PLATELET
Basophils Absolute: 0.1 10*3/uL (ref 0.0–0.1)
Basophils Relative: 1 % (ref 0–1)
Eosinophils Absolute: 0.4 10*3/uL (ref 0.0–0.7)
Eosinophils Relative: 5 % (ref 0–5)
HCT: 42 % (ref 39.0–52.0)
Hemoglobin: 14.1 g/dL (ref 13.0–17.0)
Lymphocytes Relative: 35 % (ref 12–46)
Lymphs Abs: 2.7 10*3/uL (ref 0.7–4.0)
MCH: 29.3 pg (ref 26.0–34.0)
MCHC: 33.6 g/dL (ref 30.0–36.0)
MCV: 87.3 fL (ref 78.0–100.0)
Monocytes Absolute: 0.6 10*3/uL (ref 0.1–1.0)
Monocytes Relative: 8 % (ref 3–12)
Neutro Abs: 4 10*3/uL (ref 1.7–7.7)
Neutrophils Relative %: 52 % (ref 43–77)
Platelets: 230 10*3/uL (ref 150–400)
RBC: 4.81 MIL/uL (ref 4.22–5.81)
RDW: 12.7 % (ref 11.5–15.5)
WBC: 7.8 10*3/uL (ref 4.0–10.5)

## 2012-04-24 LAB — COMPREHENSIVE METABOLIC PANEL
ALT: 13 U/L (ref 0–53)
AST: 13 U/L (ref 0–37)
Albumin: 3.7 g/dL (ref 3.5–5.2)
Alkaline Phosphatase: 57 U/L (ref 39–117)
BUN: 18 mg/dL (ref 6–23)
CO2: 28 mEq/L (ref 19–32)
Calcium: 9.4 mg/dL (ref 8.4–10.5)
Chloride: 105 mEq/L (ref 96–112)
Creatinine, Ser: 0.92 mg/dL (ref 0.50–1.35)
GFR calc Af Amer: >90 mL/min (ref >90)
GFR calc non Af Amer: >90 mL/min (ref >90)
Glucose, Bld: 96 mg/dL (ref 70–99)
Potassium: 4 mEq/L (ref 3.5–5.1)
Sodium: 141 mEq/L (ref 135–145)
Total Bilirubin: 0.7 mg/dL (ref 0.3–1.2)
Total Protein: 6.4 g/dL (ref 6.0–8.3)

## 2012-04-24 LAB — RAPID URINE DRUG SCREEN, HOSP PERFORMED
Amphetamines: NOT DETECTED
Barbiturates: NOT DETECTED
Benzodiazepines: NOT DETECTED
Cocaine: NOT DETECTED
Opiates: NOT DETECTED
Tetrahydrocannabinol: NOT DETECTED

## 2012-04-24 LAB — LIPID PANEL
Cholesterol: 196 mg/dL (ref 0–200)
HDL: 64 mg/dL (ref >39)
LDL Cholesterol: 121 mg/dL — ABNORMAL HIGH (ref 0–99)
Total CHOL/HDL Ratio: 3.1 RATIO
Triglycerides: 55 mg/dL (ref <150)
VLDL: 11 mg/dL (ref 0–40)

## 2012-04-24 LAB — HEMOGLOBIN A1C
Hgb A1c MFr Bld: 5.4 % (ref <5.7)
Hgb A1c MFr Bld: 5.5 % (ref <5.7)
Mean Plasma Glucose: 108 mg/dL (ref <117)
Mean Plasma Glucose: 111 mg/dL (ref <117)

## 2012-04-24 LAB — TSH: TSH: 1.435 u[IU]/mL (ref 0.350–4.500)

## 2012-04-24 MED ORDER — SODIUM CHLORIDE 0.9 % IV SOLN
INTRAVENOUS | Status: DC
  Administered 2012-04-24: 03:00:00 via INTRAVENOUS

## 2012-04-24 MED ORDER — GADOBENATE DIMEGLUMINE 529 MG/ML IV SOLN: 14.0000 mL | Freq: Once | INTRAVENOUS | Status: DC

## 2012-04-24 MED ORDER — IBUPROFEN 200 MG PO TABS: ORAL_TABLET | ORAL | Status: DC

## 2012-04-24 MED ORDER — ACETAMINOPHEN 500 MG PO TABS: 500.0000 mg | ORAL_TABLET | Freq: Four times a day (QID) | ORAL | Status: AC | PRN

## 2012-04-24 MED ORDER — ASPIRIN 325 MG PO TABS: 325.0000 mg | ORAL_TABLET | Freq: Every day | ORAL | Status: DC

## 2012-04-24 NOTE — Progress Notes (Signed)
VASCULAR LAB PRELIMINARY  PRELIMINARY  PRELIMINARY  PRELIMINARY  Carotid duplex  completed.    Preliminary report:  Bilateral:  No evidence of hemodynamically significant internal carotid artery stenosis.   Vertebral artery flow is antegrade.      Manuel [name], RVT 04/24/2012, 11:17 AM

## 2012-04-24 NOTE — Progress Notes (Signed)
Patient being discharge after EEG coming normal, discharge and medicattion papers reviewed and dully signed, all questions answered awaiting on transportation.

## 2012-04-24 NOTE — Care Management Note (Signed)
    Page 1 of 1   04/27/2012     10:02:03 AM   CARE MANAGEMENT NOTE 04/27/2012  Patient:  Manuel [name], Manuel [name]   Account Number:  [id number]  Date Initiated:  04/24/2012  Documentation initiated by:  Onnie Boer  Subjective/Objective Assessment:   PT WAS ADMITTED WITH WEAKNESS     Action/Plan:   PROGRESSION OF CARE AND DISCHARGE PLANNING   Anticipated DC Date:  04/26/2012   Anticipated DC Plan:  HOME/SELF CARE      DC Planning Services  CM consult      Choice offered to / List presented to:             Status of service:  Completed, signed off Medicare Important Message given?   (If response is "NO", the following Medicare IM given date fields will be blank) Date Medicare IM given:   Date Additional Medicare IM given:    Discharge Disposition:  HOME/SELF CARE  Per UR Regulation:  Reviewed for med. necessity/level of care/duration of stay  If discussed at Long Length of Stay Meetings, dates discussed:    Comments:  04/24/12 Onnie Boer, RN, BSN 1234 PT WAS ADMITTED WITH WEAKNESS AND WAS AT HOME PTA.  WILL F/U ON HH/ DC NEEDS AND RECOMMENDATIONS.

## 2012-04-24 NOTE — H&P (Signed)
Manuel [name] is an 31 y.o. male.  Patient was seen and examined on April 24, 2012 at 12:15 AM. PCP - Dr. Manus Gunning.  Chief Complaint: Left-sided numbness. 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 Once released home he started having numbness in his left upper and lower extremity. And also started developing numbness in the left side of the face and headache on the right frontal area. Eventually patient started having some convulsive episodes as witnessed by his family which were less than a minute and happened twice. After these episodes patient was confused. EMS was called and patient was brought to the ER. CT head was negative. At this time neurologist has already evaluated the patient and MRI of the brain MRA of the brain and neck and MRV has been ordered and results are pending. Patient's numbness has largely improved now but the headaches persist. In addition patient also has some diplopia when he moves his eyes all the way to the left.  Past Medical History  Diagnosis Date  . Testicular cancer 2006  . Concussion     pt reports he has hx of multiple concussions, last one was 2009    Past Surgical History  Procedure Date  . Surgery scrotal / testicular 2006    Family History  Problem Relation Age of Onset  . Diabetes type II Father    Social History:  reports that he has never smoked. He does not have any smokeless tobacco history on file. He reports that he drinks alcohol. He reports that he does not use illicit drugs.  Allergies:  Allergies  Allergen Reactions  . Iodine Anaphylaxis  . Erythromycin   . Shellfish Allergy      (Not in a hospital admission)  Results for orders placed during the hospital encounter of 04/23/12 (from the past 48 hour(s))  APTT     Status: Normal   Collection Time   04/23/12  8:10 PM      Component Value  Range Comment   aPTT 29  24 - 37 seconds   CBC WITH DIFFERENTIAL     Status: Normal   Collection Time   04/23/12  8:10 PM      Component Value Range Comment   WBC 6.5  4.0 - 10.5 K/uL    RBC 4.83  4.22 - 5.81 MIL/uL    Hemoglobin 14.2  13.0 - 17.0 g/dL    HCT 60.4  54.0 - 98.1 %    MCV 86.5  78.0 - 100.0 fL    MCH 29.4  26.0 - 34.0 pg    MCHC 34.0  30.0 - 36.0 g/dL    RDW 19.1  47.8 - 29.5 %    Platelets 234  150 - 400 K/uL    Neutrophils Relative 59  43 - 77 %    Neutro Abs 3.8  1.7 - 7.7 K/uL    Lymphocytes Relative 30  12 - 46 %    Lymphs Abs 1.9  0.7 - 4.0 K/uL    Monocytes Relative 7  3 - 12 %    Monocytes Absolute 0.4  0.1 - 1.0 K/uL    Eosinophils Relative 3  0 - 5 %    Eosinophils Absolute 0.2  0.0 - 0.7 K/uL    Basophils Relative 1  0 - 1 %    Basophils Absolute 0.1  0.0 - 0.1  K/uL   BASIC METABOLIC PANEL     Status: Normal   Collection Time   04/23/12  8:10 PM      Component Value Range Comment   Sodium 141  135 - 145 mEq/L    Potassium 3.9  3.5 - 5.1 mEq/L    Chloride 105  96 - 112 mEq/L    CO2 27  19 - 32 mEq/L    Glucose, Bld 95  70 - 99 mg/dL    BUN 19  6 - 23 mg/dL    Creatinine, Ser 4.54  0.50 - 1.35 mg/dL    Calcium 9.8  8.4 - 09.8 mg/dL    GFR calc non Af Amer >90  >90 mL/min    GFR calc Af Amer >90  >90 mL/min   GLUCOSE, CAPILLARY     Status: Normal   Collection Time   04/23/12  9:10 PM      Component Value Range Comment   Glucose-Capillary 88  70 - 99 mg/dL   LIPID PANEL     Status: Abnormal   Collection Time   04/23/12  9:30 PM      Component Value Range Comment   Cholesterol 211 (*) 0 - 200 mg/dL    Triglycerides 39  <119 mg/dL    HDL 74  >14 mg/dL    Total CHOL/HDL Ratio 2.9      VLDL 8  0 - 40 mg/dL    LDL Cholesterol 782 (*) 0 - 99 mg/dL    Ct Head Wo Contrast  04/23/2012  *RADIOLOGY REPORT*  Clinical Data:  Loss of consciousness.  Headache  CT HEAD WITHOUT CONTRAST  Technique:  Contiguous axial images were obtained from the base of the  skull through the vertex without contrast  Comparison:  05/18/2005  Findings:  The brain has a normal appearance without evidence for hemorrhage, acute infarction, hydrocephalus, or mass lesion.  There is no extra axial fluid collection.  The skull and paranasal sinuses are normal.  IMPRESSION: Normal CT of the head without contrast.   Original Report Authenticated By: Camelia Phenes, M.D.    Dg Chest Portable 1 View  04/23/2012  *RADIOLOGY REPORT*  Clinical Data: Loss of consciousness  PORTABLE CHEST - 1 VIEW  Comparison: 09/18/2011  Findings: Normal heart size.  Clear lungs.  No pneumothorax.  IMPRESSION: No active cardiopulmonary disease.   Original Report Authenticated By: Donavan Burnet, M.D.     Review of Systems  Constitutional: Negative.   HENT: Negative.   Eyes: Negative.   Respiratory: Negative.   Cardiovascular: Negative.   Gastrointestinal: Negative.   Genitourinary: Negative.   Musculoskeletal: Negative.   Skin: Negative.   Neurological:       Left facial numbness and left upper and lower extremity numbness and shaking spells.  Endo/Heme/Allergies: Negative.   Psychiatric/Behavioral: Negative.     Blood pressure 113/64, pulse 73, temperature 98.3 F (36.8 C), resp. rate 16, SpO2 96.00%. Physical Exam  Constitutional: He is oriented to person, place, and time. He appears well-developed and well-nourished. No distress.  HENT:  Head: Normocephalic and atraumatic.  Right Ear: External ear normal.  Left Ear: External ear normal.  Nose: Nose normal.  Mouth/Throat: Oropharynx is clear and moist. No oropharyngeal exudate.  Eyes: Conjunctivae are normal. Pupils are equal, round, and reactive to light. Right eye exhibits no discharge. Left eye exhibits no discharge. No scleral icterus.  Neck: Normal range of motion. Neck supple.  Cardiovascular: Normal rate and regular rhythm.  Respiratory: Effort normal and breath sounds normal. No respiratory distress. He has no wheezes. He  has no rales.  GI: Soft. Bowel sounds are normal. He exhibits no distension. There is no tenderness. There is no rebound.  Musculoskeletal: Normal range of motion. He exhibits no edema and no tenderness.  Neurological: He is alert and oriented to person, place, and time.       Moves all extremities 5/5. No facial asymmetry. Tongue is midline.  Skin: Skin is warm and dry. He is not diaphoretic.  Psychiatric: His behavior is normal.     Assessment/Plan #1. Left-sided numbness - patient will be placed on neurochecks. MRI results are pending. Monitor patient in telemetry. EEG has been ordered. Further recommendations per neurologist. #2. History of seminoma in remission. #3. History of reactive hypoglycemia.  CODE STATUS - full code.  Nalleli Largent N. 04/24/2012, 12:28 AM

## 2012-04-24 NOTE — Progress Notes (Signed)
EEG completed as ordered.

## 2012-04-24 NOTE — Progress Notes (Signed)
[facility] PROGRESS NOTE    SUBJECTIVE   Pt feels better overall. Didn't have good sleep last night though. Lights off in the room due to light sensitivity with headache- which is also improving. Numbness on Left side has improved significantly, but R sided mild pulling/throbbing headache is still there. He is eating and drinking okay. Denies any Nausea, vomiting, palpitations, CP, SOB.  OBJECTIVE   Vital signs in last 24 hours: Temp:  [97.9 F (36.6 C)-98.3 F (36.8 C)] 97.9 F (36.6 C) (08/23 0910) Pulse Rate:  [62-90] 64  (08/23 0910) Resp:  [13-20] 18  (08/23 0910) BP: (111-125)/(50-73) 112/61 mmHg (08/23 0910) SpO2:  [96 %-99 %] 98 % (08/23 0910) Weight:  [142 lb 6.4 oz (64.592 kg)] 142 lb 6.4 oz (64.592 kg) (08/23 0132)  Intake/Output from previous day: 08/22 0701 - 08/23 0700 In: 8 [P.O.:8] Out: 1550 [Urine:1550] Intake/Output this shift:   Nutritional status: Cardiac  Past Medical History  Diagnosis Date  . Testicular cancer 2006  . Concussion     pt reports he has hx of multiple concussions, last one was 2009    Neurologic ROS negative with exception of above headache and improving numbness.  Neurologic Exam:   Patient is awake, alert, oriented to person, place, time, and situation.  Patient is able to give a clear and coherent history.  Cranial Nerves:  II -XII- grossly intact. Motor:  Tone is normal.  5/5 strength B/L Sensory:  Sensation is symmetric to temperature and vibration, improving diminished sensation to light touch throughout the left side.  Deep Tendon Reflexes:  2+ and symmetric in the biceps, triceps, ankles and patellae.    Lab Results: Lab Results  Component Value Date/Time   CHOL 196 04/24/2012  7:00 AM   Lipid Panel  Basename 04/24/12 0700  CHOL 196  TRIG 55  HDL 64  CHOLHDL 3.1  VLDL 11  LDLCALC 161*    Studies/Results: Ct Head Wo Contrast  04/23/2012  *RADIOLOGY  REPORT*  Clinical Data:  Loss of consciousness.  Headache  CT HEAD WITHOUT CONTRAST  Technique:  Contiguous axial images were obtained from the base of the skull through the vertex without contrast  Comparison:  05/18/2005  Findings:  The brain has a normal appearance without evidence for hemorrhage, acute infarction, hydrocephalus, or mass lesion.  There is no extra axial fluid collection.  The skull and paranasal sinuses are normal.  IMPRESSION: Normal CT of the head without contrast.   Original Report Authenticated By: Camelia Phenes, M.D.    Mr [facility] Head Wo Contrast  04/24/2012  *RADIOLOGY REPORT*  Clinical Data:  Testicular cancer.  Concussion.  Right frontal headache.  Left-sided.  Seizure.  MRI HEAD WITHOUT CONTRAST MRA HEAD WITHOUT CONTRAST  Technique:  Multiplanar, multiecho pulse sequences of the brain and surrounding structures were obtained without intravenous contrast. Angiographic images of the head were obtained using MRA technique without contrast.  Comparison:  CT head 04/23/2012  MRI HEAD  Findings:  Ventricle size is normal.  Negative for Chiari malformation.  Pituitary is normal in size.  Negative for acute or chronic infarct.  Cerebral white matter is normal.  Brainstem is normal.  Negative for demyelinating disease. Negative for hemorrhage or mass.  Intravenous contrast was not administered but  no mass lesion is present.  Chronic sinusitis.  IMPRESSION: Normal MRI brain.  Chronic sinusitis.  MRA HEAD  Findings: Both vertebral arteries are patent to the basilar.  Right AICA is patent. Left PICA is patent.  Superior cerebellar and posterior cerebral arteries are patent.  Posterior communicating artery is patent bilaterally.  Internal carotid artery is patent bilaterally.  Anterior and middle cerebral arteries are patent bilaterally.  No cerebral aneurysm.  IMPRESSION: Negative  *RADIOLOGY REPORT*  Clinical Data:  Testicular cancer.  Headache.  MRV HEAD WITHOUT CONTRAST  Technique:  Angiographic  images of the intracranial venous structures were obtained using MRV technique without intravenous contrast.  Comparison:   None.  Findings:  Superior sagittal sinus is widely patent. Transverse sinus is patent bilaterally.  Left transverse sinus is dominant. Sigmoid sinus is patent bilaterally.  Internal cerebral veins and straight sinus are patent bilaterally.  IMPRESSION: Negative  *RADIOLOGY REPORT*  Clinical Data:  Headache.  Testicular cancer.  Rule out dissection.  MRA NECK WITHOUT AND WITH CONTRAST  Technique:  Angiographic images of the neck were obtained using MRA technique without and with intravenous contrast.  Carotid stenosis measurements (when applicable) are obtained utilizing NASCET criteria, using the distal internal carotid diameter as the denominator.  Contrast:  14 ml Multihance IV  Comparison:   None.  Findings:  Standard branching of the aortic arch.  Carotid bifurcation is widely patent bilaterally.  Nose significant carotid stenosis.  Negative for dissection or aneurysm.  Both vertebral arteries are patent to the basilar without dissection or stenosis.  IMPRESSION: Negative   Original Report Authenticated By: Camelia Phenes, M.D.    Mr Angiogram Neck W Wo Contrast  04/24/2012  *RADIOLOGY REPORT*  Clinical Data:  Testicular cancer.  Concussion.  Right frontal headache.  Left-sided.  Seizure.  MRI HEAD WITHOUT CONTRAST MRA HEAD WITHOUT CONTRAST  Technique:  Multiplanar, multiecho pulse sequences of the brain and surrounding structures were obtained without intravenous contrast. Angiographic images of the head were obtained using MRA technique without contrast.  Comparison:  CT head 04/23/2012  MRI HEAD  Findings:  Ventricle size is normal.  Negative for Chiari malformation.  Pituitary is normal in size.  Negative for acute or chronic infarct.  Cerebral white matter is normal.  Brainstem is normal.  Negative for demyelinating disease. Negative for hemorrhage or mass.  Intravenous contrast  was not administered but no mass lesion is present.  Chronic sinusitis.  IMPRESSION: Normal MRI brain.  Chronic sinusitis.  MRA HEAD  Findings: Both vertebral arteries are patent to the basilar.  Right AICA is patent. Left PICA is patent.  Superior cerebellar and posterior cerebral arteries are patent.  Posterior communicating artery is patent bilaterally.  Internal carotid artery is patent bilaterally.  Anterior and middle cerebral arteries are patent bilaterally.  No cerebral aneurysm.  IMPRESSION: Negative  *RADIOLOGY REPORT*  Clinical Data:  Testicular cancer.  Headache.  MRV HEAD WITHOUT CONTRAST  Technique:  Angiographic images of the intracranial venous structures were obtained using MRV technique without intravenous contrast.  Comparison:   None.  Findings:  Superior sagittal sinus is widely patent. Transverse sinus is patent bilaterally.  Left transverse sinus is dominant. Sigmoid sinus is patent bilaterally.  Internal cerebral veins and straight sinus are patent bilaterally.  IMPRESSION: Negative  *RADIOLOGY REPORT*  Clinical Data:  Headache.  Testicular cancer.  Rule out dissection.  MRA NECK WITHOUT AND WITH CONTRAST  Technique:  Angiographic images of the neck were obtained using MRA technique  without and with intravenous contrast.  Carotid stenosis measurements (when applicable) are obtained utilizing NASCET criteria, using the distal internal carotid diameter as the denominator.  Contrast:  14 ml Multihance IV  Comparison:   None.  Findings:  Standard branching of the aortic arch.  Carotid bifurcation is widely patent bilaterally.  Nose significant carotid stenosis.  Negative for dissection or aneurysm.  Both vertebral arteries are patent to the basilar without dissection or stenosis.  IMPRESSION: Negative   Original Report Authenticated By: Camelia Phenes, M.D.    Mr Brain Wo Contrast  04/24/2012  *RADIOLOGY REPORT*  Clinical Data:  Testicular cancer.  Concussion.  Right frontal headache.   Left-sided.  Seizure.  MRI HEAD WITHOUT CONTRAST MRA HEAD WITHOUT CONTRAST  Technique:  Multiplanar, multiecho pulse sequences of the brain and surrounding structures were obtained without intravenous contrast. Angiographic images of the head were obtained using MRA technique without contrast.  Comparison:  CT head 04/23/2012  MRI HEAD  Findings:  Ventricle size is normal.  Negative for Chiari malformation.  Pituitary is normal in size.  Negative for acute or chronic infarct.  Cerebral white matter is normal.  Brainstem is normal.  Negative for demyelinating disease. Negative for hemorrhage or mass.  Intravenous contrast was not administered but no mass lesion is present.  Chronic sinusitis.  IMPRESSION: Normal MRI brain.  Chronic sinusitis.  MRA HEAD  Findings: Both vertebral arteries are patent to the basilar.  Right AICA is patent. Left PICA is patent.  Superior cerebellar and posterior cerebral arteries are patent.  Posterior communicating artery is patent bilaterally.  Internal carotid artery is patent bilaterally.  Anterior and middle cerebral arteries are patent bilaterally.  No cerebral aneurysm.  IMPRESSION: Negative  *RADIOLOGY REPORT*  Clinical Data:  Testicular cancer.  Headache.  MRV HEAD WITHOUT CONTRAST  Technique:  Angiographic images of the intracranial venous structures were obtained using MRV technique without intravenous contrast.  Comparison:   None.  Findings:  Superior sagittal sinus is widely patent. Transverse sinus is patent bilaterally.  Left transverse sinus is dominant. Sigmoid sinus is patent bilaterally.  Internal cerebral veins and straight sinus are patent bilaterally.  IMPRESSION: Negative  *RADIOLOGY REPORT*  Clinical Data:  Headache.  Testicular cancer.  Rule out dissection.  MRA NECK WITHOUT AND WITH CONTRAST  Technique:  Angiographic images of the neck were obtained using MRA technique without and with intravenous contrast.  Carotid stenosis measurements (when applicable) are  obtained utilizing NASCET criteria, using the distal internal carotid diameter as the denominator.  Contrast:  14 ml Multihance IV  Comparison:   None.  Findings:  Standard branching of the aortic arch.  Carotid bifurcation is widely patent bilaterally.  Nose significant carotid stenosis.  Negative for dissection or aneurysm.  Both vertebral arteries are patent to the basilar without dissection or stenosis.  IMPRESSION: Negative   Original Report Authenticated By: Camelia Phenes, M.D.    Dg Chest Portable 1 View  04/23/2012  *RADIOLOGY REPORT*  Clinical Data: Loss of consciousness  PORTABLE CHEST - 1 VIEW  Comparison: 09/18/2011  Findings: Normal heart size.  Clear lungs.  No pneumothorax.  IMPRESSION: No active cardiopulmonary disease.   Original Report Authenticated By: Donavan Burnet, M.D.    Mr Mrv Head Wo Cm  04/24/2012  *RADIOLOGY REPORT*  Clinical Data:  Testicular cancer.  Concussion.  Right frontal headache.  Left-sided.  Seizure.  MRI HEAD WITHOUT CONTRAST MRA HEAD WITHOUT CONTRAST  Technique:  Multiplanar, multiecho pulse sequences of  the brain and surrounding structures were obtained without intravenous contrast. Angiographic images of the head were obtained using MRA technique without contrast.  Comparison:  CT head 04/23/2012  MRI HEAD  Findings:  Ventricle size is normal.  Negative for Chiari malformation.  Pituitary is normal in size.  Negative for acute or chronic infarct.  Cerebral white matter is normal.  Brainstem is normal.  Negative for demyelinating disease. Negative for hemorrhage or mass.  Intravenous contrast was not administered but no mass lesion is present.  Chronic sinusitis.  IMPRESSION: Normal MRI brain.  Chronic sinusitis.  MRA HEAD  Findings: Both vertebral arteries are patent to the basilar.  Right AICA is patent. Left PICA is patent.  Superior cerebellar and posterior cerebral arteries are patent.  Posterior communicating artery is patent bilaterally.  Internal carotid  artery is patent bilaterally.  Anterior and middle cerebral arteries are patent bilaterally.  No cerebral aneurysm.  IMPRESSION: Negative  *RADIOLOGY REPORT*  Clinical Data:  Testicular cancer.  Headache.  MRV HEAD WITHOUT CONTRAST  Technique:  Angiographic images of the intracranial venous structures were obtained using MRV technique without intravenous contrast.  Comparison:   None.  Findings:  Superior sagittal sinus is widely patent. Transverse sinus is patent bilaterally.  Left transverse sinus is dominant. Sigmoid sinus is patent bilaterally.  Internal cerebral veins and straight sinus are patent bilaterally.  IMPRESSION: Negative  *RADIOLOGY REPORT*  Clinical Data:  Headache.  Testicular cancer.  Rule out dissection.  MRA NECK WITHOUT AND WITH CONTRAST  Technique:  Angiographic images of the neck were obtained using MRA technique without and with intravenous contrast.  Carotid stenosis measurements (when applicable) are obtained utilizing NASCET criteria, using the distal internal carotid diameter as the denominator.  Contrast:  14 ml Multihance IV  Comparison:   None.  Findings:  Standard branching of the aortic arch.  Carotid bifurcation is widely patent bilaterally.  Nose significant carotid stenosis.  Negative for dissection or aneurysm.  Both vertebral arteries are patent to the basilar without dissection or stenosis.  IMPRESSION: Negative   Original Report Authenticated By: Camelia Phenes, M.D.     Medications:    I have reviewed the patient's current medications.  Assessment/Plan:   Patient Active Problem List  Diagnosis  . Left sided numbness   - Most likely from complicated migraine as patient has chronic throbbing intermittent headache - once a month- which lasts for about 2 hours. Also has FH of migraine in mother. Although Seizures cant be ruled out yet- although this is unlikely to be one. - MRI/MRA/MRV brain and neck negative for any acute pathology. Carotid dissection, CVA ruled  out. Also prelim result of carotid dopplers are negative- final pending. 2D ECHO done but results pending. - No electrolyte abnormalities. HbA1c- 5.4. FLP- LDL-121 and total- 196. - Vitals stable and no new neurological changes. - Will obtain EEG today and if normal, will be ready to D/C home with migraine abortive Rx- with PRN tylenol. - Will D/C IVF  PATEL,RAVI MD  04/24/2012, 12:09 PM

## 2012-04-24 NOTE — Progress Notes (Signed)
RN was notified of abnormal BP  

## 2012-04-24 NOTE — Progress Notes (Signed)
RN was notified of lying BP

## 2012-04-24 NOTE — Progress Notes (Signed)
  Echocardiogram 2D Echocardiogram has been performed.  Manuel [name] 04/24/2012, 11:26 AM

## 2012-04-24 NOTE — Progress Notes (Signed)
Subjective: Patient relates resolution of symptoms. No numbness or tingling.  Still relates mild headaches.   Objective: Filed Vitals:   04/24/12 0910 04/24/12 1441 04/24/12 1443 04/24/12 1445  BP: 112/61 100/56 98/61 111/61  Pulse: 64 69    Temp: 97.9 F (36.6 C) 97.9 F (36.6 C)    TempSrc: Oral     Resp: 18 18    Height:      Weight:      SpO2: 98% 99%     Weight change:    General: Alert, awake, oriented x3, in no acute distress.  HEENT: No bruits, no goiter.  Heart: Regular rate and rhythm, without murmurs, rubs, gallops.  Lungs: CTA, bilateral air movement.  Abdomen: Soft, nontender, nondistended, positive bowel sounds.  Neuro: Grossly intact, nonfocal. Extremities; no edema./   Lab Results:  Basename 04/24/12 0700 04/23/12 2010  NA 141 141  K 4.0 3.9  CL 105 105  CO2 28 27  GLUCOSE 96 95  BUN 18 19  CREATININE 0.92 0.90  CALCIUM 9.4 9.8  MG -- --  PHOS -- --    Basename 04/24/12 0700  AST 13  ALT 13  ALKPHOS 57  BILITOT 0.7  PROT 6.4  ALBUMIN 3.7   No results found for this basename: LIPASE:2,AMYLASE:2 in the last 72 hours  Basename 04/24/12 0700 04/23/12 2010  WBC 7.8 6.5  NEUTROABS 4.0 3.8  HGB 14.1 14.2  HCT 42.0 41.8  MCV 87.3 86.5  PLT 230 234    Basename 04/24/12 0700 04/23/12 2129  HGBA1C 5.5 5.4    Basename 04/24/12 0700 04/23/12 2130  CHOL 196 211*  HDL 64 74  LDLCALC 121* 129*  TRIG 55 39  CHOLHDL 3.1 2.9  LDLDIRECT -- --    Basename 04/24/12 0700  TSH 1.435  T4TOTAL --  T3FREE --  THYROIDAB --   No results found for this basename: VITAMINB12:2,FOLATE:2,FERRITIN:2,TIBC:2,IRON:2,RETICCTPCT:2 in the last 72 hours  Micro Results: No results found for this or any previous visit (from the past 240 hour(s)).  Studies/Results: Ct Head Wo Contrast  04/23/2012  *RADIOLOGY REPORT*  Clinical Data:  Loss of consciousness.  Headache  CT HEAD WITHOUT CONTRAST  Technique:  Contiguous axial images were obtained from the base of  the skull through the vertex without contrast  Comparison:  05/18/2005  Findings:  The brain has a normal appearance without evidence for hemorrhage, acute infarction, hydrocephalus, or mass lesion.  There is no extra axial fluid collection.  The skull and paranasal sinuses are normal.  IMPRESSION: Normal CT of the head without contrast.   Original Report Authenticated By: Camelia Phenes, M.D.    Mr [facility] Head Wo Contrast  04/24/2012  *RADIOLOGY REPORT*  Clinical Data:  Testicular cancer.  Concussion.  Right frontal headache.  Left-sided.  Seizure.  MRI HEAD WITHOUT CONTRAST MRA HEAD WITHOUT CONTRAST  Technique:  Multiplanar, multiecho pulse sequences of the brain and surrounding structures were obtained without intravenous contrast. Angiographic images of the head were obtained using MRA technique without contrast.  Comparison:  CT head 04/23/2012  MRI HEAD  Findings:  Ventricle size is normal.  Negative for Chiari malformation.  Pituitary is normal in size.  Negative for acute or chronic infarct.  Cerebral white matter is normal.  Brainstem is normal.  Negative for demyelinating disease. Negative for hemorrhage or mass.  Intravenous contrast was not administered but no mass lesion is present.  Chronic sinusitis.  IMPRESSION: Normal MRI brain.  Chronic sinusitis.  MRA HEAD  Findings: Both vertebral arteries are patent to the basilar.  Right AICA is patent. Left PICA is patent.  Superior cerebellar and posterior cerebral arteries are patent.  Posterior communicating artery is patent bilaterally.  Internal carotid artery is patent bilaterally.  Anterior and middle cerebral arteries are patent bilaterally.  No cerebral aneurysm.  IMPRESSION: Negative  *RADIOLOGY REPORT*  Clinical Data:  Testicular cancer.  Headache.  MRV HEAD WITHOUT CONTRAST  Technique:  Angiographic images of the intracranial venous structures were obtained using MRV technique without intravenous contrast.  Comparison:   None.  Findings:  Superior  sagittal sinus is widely patent. Transverse sinus is patent bilaterally.  Left transverse sinus is dominant. Sigmoid sinus is patent bilaterally.  Internal cerebral veins and straight sinus are patent bilaterally.  IMPRESSION: Negative  *RADIOLOGY REPORT*  Clinical Data:  Headache.  Testicular cancer.  Rule out dissection.  MRA NECK WITHOUT AND WITH CONTRAST  Technique:  Angiographic images of the neck were obtained using MRA technique without and with intravenous contrast.  Carotid stenosis measurements (when applicable) are obtained utilizing NASCET criteria, using the distal internal carotid diameter as the denominator.  Contrast:  14 ml Multihance IV  Comparison:   None.  Findings:  Standard branching of the aortic arch.  Carotid bifurcation is widely patent bilaterally.  Nose significant carotid stenosis.  Negative for dissection or aneurysm.  Both vertebral arteries are patent to the basilar without dissection or stenosis.  IMPRESSION: Negative   Original Report Authenticated By: Camelia Phenes, M.D.    Mr Angiogram Neck W Wo Contrast  04/24/2012  *RADIOLOGY REPORT*  Clinical Data:  Testicular cancer.  Concussion.  Right frontal headache.  Left-sided.  Seizure.  MRI HEAD WITHOUT CONTRAST MRA HEAD WITHOUT CONTRAST  Technique:  Multiplanar, multiecho pulse sequences of the brain and surrounding structures were obtained without intravenous contrast. Angiographic images of the head were obtained using MRA technique without contrast.  Comparison:  CT head 04/23/2012  MRI HEAD  Findings:  Ventricle size is normal.  Negative for Chiari malformation.  Pituitary is normal in size.  Negative for acute or chronic infarct.  Cerebral white matter is normal.  Brainstem is normal.  Negative for demyelinating disease. Negative for hemorrhage or mass.  Intravenous contrast was not administered but no mass lesion is present.  Chronic sinusitis.  IMPRESSION: Normal MRI brain.  Chronic sinusitis.  MRA HEAD  Findings: Both  vertebral arteries are patent to the basilar.  Right AICA is patent. Left PICA is patent.  Superior cerebellar and posterior cerebral arteries are patent.  Posterior communicating artery is patent bilaterally.  Internal carotid artery is patent bilaterally.  Anterior and middle cerebral arteries are patent bilaterally.  No cerebral aneurysm.  IMPRESSION: Negative  *RADIOLOGY REPORT*  Clinical Data:  Testicular cancer.  Headache.  MRV HEAD WITHOUT CONTRAST  Technique:  Angiographic images of the intracranial venous structures were obtained using MRV technique without intravenous contrast.  Comparison:   None.  Findings:  Superior sagittal sinus is widely patent. Transverse sinus is patent bilaterally.  Left transverse sinus is dominant. Sigmoid sinus is patent bilaterally.  Internal cerebral veins and straight sinus are patent bilaterally.  IMPRESSION: Negative  *RADIOLOGY REPORT*  Clinical Data:  Headache.  Testicular cancer.  Rule out dissection.  MRA NECK WITHOUT AND WITH CONTRAST  Technique:  Angiographic images of the neck were obtained using MRA technique without and with intravenous contrast.  Carotid stenosis measurements (when applicable) are obtained utilizing NASCET criteria, using the distal internal  carotid diameter as the denominator.  Contrast:  14 ml Multihance IV  Comparison:   None.  Findings:  Standard branching of the aortic arch.  Carotid bifurcation is widely patent bilaterally.  Nose significant carotid stenosis.  Negative for dissection or aneurysm.  Both vertebral arteries are patent to the basilar without dissection or stenosis.  IMPRESSION: Negative   Original Report Authenticated By: Camelia Phenes, M.D.    Mr Brain Wo Contrast  04/24/2012  *RADIOLOGY REPORT*  Clinical Data:  Testicular cancer.  Concussion.  Right frontal headache.  Left-sided.  Seizure.  MRI HEAD WITHOUT CONTRAST MRA HEAD WITHOUT CONTRAST  Technique:  Multiplanar, multiecho pulse sequences of the brain and surrounding  structures were obtained without intravenous contrast. Angiographic images of the head were obtained using MRA technique without contrast.  Comparison:  CT head 04/23/2012  MRI HEAD  Findings:  Ventricle size is normal.  Negative for Chiari malformation.  Pituitary is normal in size.  Negative for acute or chronic infarct.  Cerebral white matter is normal.  Brainstem is normal.  Negative for demyelinating disease. Negative for hemorrhage or mass.  Intravenous contrast was not administered but no mass lesion is present.  Chronic sinusitis.  IMPRESSION: Normal MRI brain.  Chronic sinusitis.  MRA HEAD  Findings: Both vertebral arteries are patent to the basilar.  Right AICA is patent. Left PICA is patent.  Superior cerebellar and posterior cerebral arteries are patent.  Posterior communicating artery is patent bilaterally.  Internal carotid artery is patent bilaterally.  Anterior and middle cerebral arteries are patent bilaterally.  No cerebral aneurysm.  IMPRESSION: Negative  *RADIOLOGY REPORT*  Clinical Data:  Testicular cancer.  Headache.  MRV HEAD WITHOUT CONTRAST  Technique:  Angiographic images of the intracranial venous structures were obtained using MRV technique without intravenous contrast.  Comparison:   None.  Findings:  Superior sagittal sinus is widely patent. Transverse sinus is patent bilaterally.  Left transverse sinus is dominant. Sigmoid sinus is patent bilaterally.  Internal cerebral veins and straight sinus are patent bilaterally.  IMPRESSION: Negative  *RADIOLOGY REPORT*  Clinical Data:  Headache.  Testicular cancer.  Rule out dissection.  MRA NECK WITHOUT AND WITH CONTRAST  Technique:  Angiographic images of the neck were obtained using MRA technique without and with intravenous contrast.  Carotid stenosis measurements (when applicable) are obtained utilizing NASCET criteria, using the distal internal carotid diameter as the denominator.  Contrast:  14 ml Multihance IV  Comparison:   None.   Findings:  Standard branching of the aortic arch.  Carotid bifurcation is widely patent bilaterally.  Nose significant carotid stenosis.  Negative for dissection or aneurysm.  Both vertebral arteries are patent to the basilar without dissection or stenosis.  IMPRESSION: Negative   Original Report Authenticated By: Camelia Phenes, M.D.    Dg Chest Portable 1 View  04/23/2012  *RADIOLOGY REPORT*  Clinical Data: Loss of consciousness  PORTABLE CHEST - 1 VIEW  Comparison: 09/18/2011  Findings: Normal heart size.  Clear lungs.  No pneumothorax.  IMPRESSION: No active cardiopulmonary disease.   Original Report Authenticated By: Donavan Burnet, M.D.    Mr Mrv Head Wo Cm  04/24/2012  *RADIOLOGY REPORT*  Clinical Data:  Testicular cancer.  Concussion.  Right frontal headache.  Left-sided.  Seizure.  MRI HEAD WITHOUT CONTRAST MRA HEAD WITHOUT CONTRAST  Technique:  Multiplanar, multiecho pulse sequences of the brain and surrounding structures were obtained without intravenous contrast. Angiographic images of the head were obtained using MRA technique  without contrast.  Comparison:  CT head 04/23/2012  MRI HEAD  Findings:  Ventricle size is normal.  Negative for Chiari malformation.  Pituitary is normal in size.  Negative for acute or chronic infarct.  Cerebral white matter is normal.  Brainstem is normal.  Negative for demyelinating disease. Negative for hemorrhage or mass.  Intravenous contrast was not administered but no mass lesion is present.  Chronic sinusitis.  IMPRESSION: Normal MRI brain.  Chronic sinusitis.  MRA HEAD  Findings: Both vertebral arteries are patent to the basilar.  Right AICA is patent. Left PICA is patent.  Superior cerebellar and posterior cerebral arteries are patent.  Posterior communicating artery is patent bilaterally.  Internal carotid artery is patent bilaterally.  Anterior and middle cerebral arteries are patent bilaterally.  No cerebral aneurysm.  IMPRESSION: Negative  *RADIOLOGY REPORT*   Clinical Data:  Testicular cancer.  Headache.  MRV HEAD WITHOUT CONTRAST  Technique:  Angiographic images of the intracranial venous structures were obtained using MRV technique without intravenous contrast.  Comparison:   None.  Findings:  Superior sagittal sinus is widely patent. Transverse sinus is patent bilaterally.  Left transverse sinus is dominant. Sigmoid sinus is patent bilaterally.  Internal cerebral veins and straight sinus are patent bilaterally.  IMPRESSION: Negative  *RADIOLOGY REPORT*  Clinical Data:  Headache.  Testicular cancer.  Rule out dissection.  MRA NECK WITHOUT AND WITH CONTRAST  Technique:  Angiographic images of the neck were obtained using MRA technique without and with intravenous contrast.  Carotid stenosis measurements (when applicable) are obtained utilizing NASCET criteria, using the distal internal carotid diameter as the denominator.  Contrast:  14 ml Multihance IV  Comparison:   None.  Findings:  Standard branching of the aortic arch.  Carotid bifurcation is widely patent bilaterally.  Nose significant carotid stenosis.  Negative for dissection or aneurysm.  Both vertebral arteries are patent to the basilar without dissection or stenosis.  IMPRESSION: Negative   Original Report Authenticated By: Camelia Phenes, M.D.     Medications: I have reviewed the patient's current medications.  1. Left-sided numbness - Headache: MRI, MRA, MRV negative. He relates that at time of episode Blood sugar was at 88. EEG pending, he initially was refusing the test. Differential, complex migraine, decrease perfusion due to syncope, seizure. UDS negative.  2. History of seminoma in remission.  3. History of reactive hypoglycemia. 4-Syncope: could vasovagal responds secondary to pain. No arrhythmia on telemetry. Left ventricle: The cavity size was normal. Wall thickness was normal. Systolic function was vigorous. The estimated ejection fraction was in the range of 65% to 70%. Wall motion was  normal; there were no regional wall motion abnormalities. Left ventricular diastolic function parameters were normal. No orthostatic vitals. MRI negative. No hypoglycemia reported.        LOS: 1 day   Vessie Olmsted M.D.  [facility] 04/24/2012, 4:45 PM

## 2012-04-24 NOTE — Discharge Summary (Signed)
Physician Discharge Summary  Manuel [name] ZOX:[id number] DOB: July 21, 1981 DOA: 04/23/2012  PCP: No primary provider on file.  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   Syncope ? Vasovagal.  Discharge Medication:  Tylenol 500 mg Q 6 hour PRN  Ibuprofen 400 mg BID for 1 or 2 days for headaches as needed.   Discharge Condition: Stable.   Disposition:  Follow-up Information    Please follow up. (follow up with PCP.  )          Diet: Regular Diet.  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 Once released home he started having numbness in his left upper and lower extremity. And also started developing numbness in the left side of the face and headache on the right frontal area. Eventually patient started having some convulsive episodes as witnessed by his family which were less than a minute and happened twice. After these episodes patient was confused. EMS was called and patient was brought to the ER. CT head was negative. At this time neurologist has already evaluated the patient and MRI of the brain MRA of the brain and neck and MRV has been ordered and results are pending.  Patient's numbness has largely improved now but the headaches persist.  In addition patient also has some diplopia when he moves his eyes all the way to the left.   Hospital Course:  1. Left-sided numbness - Headache: MRI, MRA, MRV negative. He relates that at time of episode Blood sugar was at 88. EEG normal,  Differential, complex migraine, decrease perfusion due to syncope, seizure. UDS negative. OK to be discharge by neuro. I advised patient to follow up with [facility] or  [facility].  2. History of seminoma in remission.  3. History of reactive hypoglycemia.  4-Syncope: could be vasovagal responds secondary to pain. No arrhythmia on telemetry. Left ventricle: The cavity size was normal. Wall thickness was normal. Systolic function was vigorous. The estimated ejection fraction was in the range of 65% to 70%. Wall motion was normal; there were no regional wall motion abnormalities. Left ventricular diastolic function parameters were normal. No orthostatic vitals. MRI negative. No hypoglycemia reported. Needs to follow up with PCP. Advised patient not to drive for now.      Discharge Exam: Filed Vitals:   04/24/12 1445  BP: 111/61  Pulse:   Temp:   Resp:    Filed Vitals:   04/24/12 0910 04/24/12 1441 04/24/12 1443 04/24/12 1445  BP: 112/61 100/56 98/61 111/61  Pulse: 64 69    Temp: 97.9 F (36.6 C) 97.9 F (36.6 C)    TempSrc: Oral     Resp: 18 18    Height:      Weight:      SpO2: 98% 99%     General: No distress. Cardiovascular: S1, S2 RRR Respiratory: CTA Neuro: non focal.   Discharge Instructions  Discharge Orders    Future Appointments: Provider: Department: Dept Phone: Center:   05/12/2012 2:00 PM Levert Feinstein, MD [facility] [phone] None     Future Orders Please Complete By Expires   Diet general      Increase activity slowly  Medication List  As of 04/24/2012  6:13 PM   TAKE these medications         acetaminophen 500 MG tablet   Commonly known as: TYLENOL   Take 1 tablet (500 mg total) by mouth every 6 (six) hours as needed for pain.      ibuprofen 200 MG tablet   Commonly known as: ADVIL,MOTRIN   Take 2 tablet twice a day with meals for headache as needed, for 1 or 2 days.              The results of significant diagnostics from this hospitalization (including imaging, microbiology, ancillary and laboratory) are listed below for reference.    Significant Diagnostic Studies: Ct Head  Wo Contrast  04/23/2012  *RADIOLOGY REPORT*  Clinical Data:  Loss of consciousness.  Headache  CT HEAD WITHOUT CONTRAST  Technique:  Contiguous axial images were obtained from the base of the skull through the vertex without contrast  Comparison:  05/18/2005  Findings:  The brain has a normal appearance without evidence for hemorrhage, acute infarction, hydrocephalus, or mass lesion.  There is no extra axial fluid collection.  The skull and paranasal sinuses are normal.  IMPRESSION: Normal CT of the head without contrast.   Original Report Authenticated By: Camelia Phenes, M.D.    Mr [facility] Head Wo Contrast  04/24/2012  *RADIOLOGY REPORT*  Clinical Data:  Testicular cancer.  Concussion.  Right frontal headache.  Left-sided.  Seizure.  MRI HEAD WITHOUT CONTRAST MRA HEAD WITHOUT CONTRAST  Technique:  Multiplanar, multiecho pulse sequences of the brain and surrounding structures were obtained without intravenous contrast. Angiographic images of the head were obtained using MRA technique without contrast.  Comparison:  CT head 04/23/2012  MRI HEAD  Findings:  Ventricle size is normal.  Negative for Chiari malformation.  Pituitary is normal in size.  Negative for acute or chronic infarct.  Cerebral white matter is normal.  Brainstem is normal.  Negative for demyelinating disease. Negative for hemorrhage or mass.  Intravenous contrast was not administered but no mass lesion is present.  Chronic sinusitis.  IMPRESSION: Normal MRI brain.  Chronic sinusitis.  MRA HEAD  Findings: Both vertebral arteries are patent to the basilar.  Right AICA is patent. Left PICA is patent.  Superior cerebellar and posterior cerebral arteries are patent.  Posterior communicating artery is patent bilaterally.  Internal carotid artery is patent bilaterally.  Anterior and middle cerebral arteries are patent bilaterally.  No cerebral aneurysm.  IMPRESSION: Negative  *RADIOLOGY REPORT*  Clinical Data:  Testicular cancer.  Headache.  MRV HEAD  WITHOUT CONTRAST  Technique:  Angiographic images of the intracranial venous structures were obtained using MRV technique without intravenous contrast.  Comparison:   None.  Findings:  Superior sagittal sinus is widely patent. Transverse sinus is patent bilaterally.  Left transverse sinus is dominant. Sigmoid sinus is patent bilaterally.  Internal cerebral veins and straight sinus are patent bilaterally.  IMPRESSION: Negative  *RADIOLOGY REPORT*  Clinical Data:  Headache.  Testicular cancer.  Rule out dissection.  MRA NECK WITHOUT AND WITH CONTRAST  Technique:  Angiographic images of the neck were obtained using MRA technique without and with intravenous contrast.  Carotid stenosis measurements (when applicable) are obtained utilizing NASCET criteria, using the distal internal carotid diameter as the denominator.  Contrast:  14 ml Multihance IV  Comparison:   None.  Findings:  Standard branching of the aortic arch.  Carotid bifurcation is widely patent bilaterally.  Nose significant carotid stenosis.  Negative for dissection or aneurysm.  Both vertebral arteries are patent to the basilar without dissection or stenosis.  IMPRESSION: Negative   Original Report Authenticated By: Camelia Phenes, M.D.    Mr Angiogram Neck W Wo Contrast  04/24/2012  *RADIOLOGY REPORT*  Clinical Data:  Testicular cancer.  Concussion.  Right frontal headache.  Left-sided.  Seizure.  MRI HEAD WITHOUT CONTRAST MRA HEAD WITHOUT CONTRAST  Technique:  Multiplanar, multiecho pulse sequences of the brain and surrounding structures were obtained without intravenous contrast. Angiographic images of the head were obtained using MRA technique without contrast.  Comparison:  CT head 04/23/2012  MRI HEAD  Findings:  Ventricle size is normal.  Negative for Chiari malformation.  Pituitary is normal in size.  Negative for acute or chronic infarct.  Cerebral white matter is normal.  Brainstem is normal.  Negative for demyelinating disease. Negative for  hemorrhage or mass.  Intravenous contrast was not administered but no mass lesion is present.  Chronic sinusitis.  IMPRESSION: Normal MRI brain.  Chronic sinusitis.  MRA HEAD  Findings: Both vertebral arteries are patent to the basilar.  Right AICA is patent. Left PICA is patent.  Superior cerebellar and posterior cerebral arteries are patent.  Posterior communicating artery is patent bilaterally.  Internal carotid artery is patent bilaterally.  Anterior and middle cerebral arteries are patent bilaterally.  No cerebral aneurysm.  IMPRESSION: Negative  *RADIOLOGY REPORT*  Clinical Data:  Testicular cancer.  Headache.  MRV HEAD WITHOUT CONTRAST  Technique:  Angiographic images of the intracranial venous structures were obtained using MRV technique without intravenous contrast.  Comparison:   None.  Findings:  Superior sagittal sinus is widely patent. Transverse sinus is patent bilaterally.  Left transverse sinus is dominant. Sigmoid sinus is patent bilaterally.  Internal cerebral veins and straight sinus are patent bilaterally.  IMPRESSION: Negative  *RADIOLOGY REPORT*  Clinical Data:  Headache.  Testicular cancer.  Rule out dissection.  MRA NECK WITHOUT AND WITH CONTRAST  Technique:  Angiographic images of the neck were obtained using MRA technique without and with intravenous contrast.  Carotid stenosis measurements (when applicable) are obtained utilizing NASCET criteria, using the distal internal carotid diameter as the denominator.  Contrast:  14 ml Multihance IV  Comparison:   None.  Findings:  Standard branching of the aortic arch.  Carotid bifurcation is widely patent bilaterally.  Nose significant carotid stenosis.  Negative for dissection or aneurysm.  Both vertebral arteries are patent to the basilar without dissection or stenosis.  IMPRESSION: Negative   Original Report Authenticated By: Camelia Phenes, M.D.    Mr Brain Wo Contrast  04/24/2012  *RADIOLOGY REPORT*  Clinical Data:  Testicular cancer.   Concussion.  Right frontal headache.  Left-sided.  Seizure.  MRI HEAD WITHOUT CONTRAST MRA HEAD WITHOUT CONTRAST  Technique:  Multiplanar, multiecho pulse sequences of the brain and surrounding structures were obtained without intravenous contrast. Angiographic images of the head were obtained using MRA technique without contrast.  Comparison:  CT head 04/23/2012  MRI HEAD  Findings:  Ventricle size is normal.  Negative for Chiari malformation.  Pituitary is normal in size.  Negative for acute or chronic infarct.  Cerebral white matter is normal.  Brainstem is normal.  Negative for demyelinating disease. Negative for hemorrhage or mass.  Intravenous contrast was not administered but no mass lesion is present.  Chronic sinusitis.  IMPRESSION: Normal MRI brain.  Chronic sinusitis.  MRA HEAD  Findings: Both vertebral arteries are patent to the basilar.  Right AICA is patent. Left PICA is patent.  Superior cerebellar and posterior cerebral arteries are patent.  Posterior communicating artery is patent bilaterally.  Internal carotid artery is patent bilaterally.  Anterior and middle cerebral arteries are patent bilaterally.  No cerebral aneurysm.  IMPRESSION: Negative  *RADIOLOGY REPORT*  Clinical Data:  Testicular cancer.  Headache.  MRV HEAD WITHOUT CONTRAST  Technique:  Angiographic images of the intracranial venous structures were obtained using MRV technique without intravenous contrast.  Comparison:   None.  Findings:  Superior sagittal sinus is widely patent. Transverse sinus is patent bilaterally.  Left transverse sinus is dominant. Sigmoid sinus is patent bilaterally.  Internal cerebral veins and straight sinus are patent bilaterally.  IMPRESSION: Negative  *RADIOLOGY REPORT*  Clinical Data:  Headache.  Testicular cancer.  Rule out dissection.  MRA NECK WITHOUT AND WITH CONTRAST  Technique:  Angiographic images of the neck were obtained using MRA technique without and with intravenous contrast.  Carotid stenosis  measurements (when applicable) are obtained utilizing NASCET criteria, using the distal internal carotid diameter as the denominator.  Contrast:  14 ml Multihance IV  Comparison:   None.  Findings:  Standard branching of the aortic arch.  Carotid bifurcation is widely patent bilaterally.  Nose significant carotid stenosis.  Negative for dissection or aneurysm.  Both vertebral arteries are patent to the basilar without dissection or stenosis.  IMPRESSION: Negative   Original Report Authenticated By: Camelia Phenes, M.D.    Dg Chest Portable 1 View  04/23/2012  *RADIOLOGY REPORT*  Clinical Data: Loss of consciousness  PORTABLE CHEST - 1 VIEW  Comparison: 09/18/2011  Findings: Normal heart size.  Clear lungs.  No pneumothorax.  IMPRESSION: No active cardiopulmonary disease.   Original Report Authenticated By: Donavan Burnet, M.D.    Mr Mrv Head Wo Cm  04/24/2012  *RADIOLOGY REPORT*  Clinical Data:  Testicular cancer.  Concussion.  Right frontal headache.  Left-sided.  Seizure.  MRI HEAD WITHOUT CONTRAST MRA HEAD WITHOUT CONTRAST  Technique:  Multiplanar, multiecho pulse sequences of the brain and surrounding structures were obtained without intravenous contrast. Angiographic images of the head were obtained using MRA technique without contrast.  Comparison:  CT head 04/23/2012  MRI HEAD  Findings:  Ventricle size is normal.  Negative for Chiari malformation.  Pituitary is normal in size.  Negative for acute or chronic infarct.  Cerebral white matter is normal.  Brainstem is normal.  Negative for demyelinating disease. Negative for hemorrhage or mass.  Intravenous contrast was not administered but no mass lesion is present.  Chronic sinusitis.  IMPRESSION: Normal MRI brain.  Chronic sinusitis.  MRA HEAD  Findings: Both vertebral arteries are patent to the basilar.  Right AICA is patent. Left PICA is patent.  Superior cerebellar and posterior cerebral arteries are patent.  Posterior communicating artery is patent  bilaterally.  Internal carotid artery is patent bilaterally.  Anterior and middle cerebral arteries are patent bilaterally.  No cerebral aneurysm.  IMPRESSION: Negative  *RADIOLOGY REPORT*  Clinical Data:  Testicular cancer.  Headache.  MRV HEAD WITHOUT CONTRAST  Technique:  Angiographic images of the intracranial venous structures were obtained using MRV technique without intravenous contrast.  Comparison:   None.  Findings:  Superior sagittal sinus is widely patent. Transverse sinus is patent bilaterally.  Left transverse sinus is dominant. Sigmoid sinus is patent bilaterally.  Internal cerebral veins and straight sinus are patent bilaterally.  IMPRESSION: Negative  *RADIOLOGY REPORT*  Clinical Data:  Headache.  Testicular cancer.  Rule out dissection.  MRA NECK WITHOUT AND WITH CONTRAST  Technique:  Angiographic images of the neck were obtained using MRA technique without and with intravenous contrast.  Carotid stenosis measurements (when applicable) are obtained utilizing NASCET criteria, using the distal internal carotid diameter as the denominator.  Contrast:  14 ml Multihance IV  Comparison:   None.  Findings:  Standard branching of the aortic arch.  Carotid bifurcation is widely patent bilaterally.  Nose significant carotid stenosis.  Negative for dissection or aneurysm.  Both vertebral arteries are patent to the basilar without dissection or stenosis.  IMPRESSION: Negative   Original Report Authenticated By: Camelia Phenes, M.D.     Microbiology: No results found for this or any previous visit (from the past 240 hour(s)).   Labs: Basic Metabolic Panel:  Lab 04/24/12 2130 04/23/12 2010  NA 141 141  K 4.0 3.9  CL 105 105  CO2 28 27  GLUCOSE 96 95  BUN 18 19  CREATININE 0.92 0.90  CALCIUM 9.4 9.8  MG -- --  PHOS -- --   Liver Function Tests:  Lab 04/24/12 0700  AST 13  ALT 13  ALKPHOS 57  BILITOT 0.7  PROT 6.4  ALBUMIN 3.7   CBC:  Lab 04/24/12 0700 04/23/12 2010  WBC 7.8 6.5    NEUTROABS 4.0 3.8  HGB 14.1 14.2  HCT 42.0 41.8  MCV 87.3 86.5  PLT 230 234   CBG:  Lab 04/23/12 2110  GLUCAP 88    Time coordinating discharge: 45 minutes.   SignedHartley Barefoot  [facility] 04/24/2012, 6:13 PM

## 2012-04-25 NOTE — Procedures (Signed)
EEG NUMBER:  INDICATION FOR STUDY:  A 31 year old man who was admitted with a history of onset of headache involving the frontal region as well as 2 episodes of apparent loss of consciousness with questionable seizure activity. The patient had right-sided weakness at the time of admission.  Imaging studies including MRI and MRA were normal.  The patient was thought to likely have complicated migraine headache.  Study is being performed to rule out indications of an epileptic disorder.  DESCRIPTION:  This is a routine EEG recording performed during wakefulness.  Predominant background activity consisted of 10 Hz symmetrical alpha rhythm recorded from the posterior head regions with good attenuation of alpha activity with eye opening.  Photic stimulation produced a symmetrical occipital driving response.  Hyperventilation produced a normal symmetrical transient slowing response.  No epileptiform discharges were recorded.  There was no abnormal slowing of cerebral activity diffusely nor focally.  INTERPRETATION:  This is a normal EEG recording during wakefulness.  No evidence of an epileptic disorder was demonstrated.     Noel Christmas, MD    [id number] D:  04/24/2012 17:43:00  T:  04/25/2012 03:32:32  Job #:  [id number]

## 2012-05-08 ENCOUNTER — Telehealth: Payer: Self-pay | Admitting: Oncology

## 2012-05-08 NOTE — Telephone Encounter (Signed)
Pt called and r/s appt from 9/10 to November 2013 MD visit  Only

## 2012-05-12 ENCOUNTER — Ambulatory Visit: Payer: BC Managed Care – PPO | Admitting: Oncology

## 2012-07-17 ENCOUNTER — Telehealth: Payer: Self-pay | Admitting: Oncology

## 2012-07-17 ENCOUNTER — Encounter: Payer: Self-pay | Admitting: Oncology

## 2012-07-17 ENCOUNTER — Ambulatory Visit (HOSPITAL_BASED_OUTPATIENT_CLINIC_OR_DEPARTMENT_OTHER): Payer: BC Managed Care – PPO | Admitting: Oncology

## 2012-07-17 VITALS — BP 122/66 | HR 82 | Temp 98.0°F | Resp 20 | Ht 70.5 in | Wt 147.8 lb

## 2012-07-17 DIAGNOSIS — C6290 Malignant neoplasm of unspecified testis, unspecified whether descended or undescended: Secondary | ICD-10-CM | POA: Insufficient documentation

## 2012-07-17 DIAGNOSIS — G43809 Other migraine, not intractable, without status migrainosus: Secondary | ICD-10-CM

## 2012-07-17 DIAGNOSIS — R109 Unspecified abdominal pain: Secondary | ICD-10-CM

## 2012-07-17 HISTORY — DX: Unspecified abdominal pain: R10.9

## 2012-07-17 NOTE — Telephone Encounter (Signed)
Gave pt Appt for November 2014 MD only, pt will be called by Radiology scheduling for U/S abdomen for next week

## 2012-07-17 NOTE — Patient Instructions (Signed)
Chest X ray and abdominal ultrasound next week We will call you w results If all good - MD visit in 1 year 07/23/13

## 2012-07-17 NOTE — Progress Notes (Signed)
Hematology and Oncology Follow Up Visit  Manuel [name] [id number] 03-05-1981 31 y.o. 07/17/2012 7:08 PM   Principle Diagnosis: Encounter Diagnoses  Name Primary?  . Testicular cancer Yes  . 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  He was treated with a radical inguinal orchiectomy by Dr. Barron Alvine and subsequently received radiation 25.6 Gy in 1.6 Gy fractions 12/12/2004 through 01/02/2005 by Dr. Margaretmary Bayley.  Initial HCG tumor marker borderline elevated at 5 units, normal less than 3, with a normal alpha fetoprotein.  Staging evaluation otherwise unremarkable including CT scans of the chest, abdomen and pelvis.    Overall he is doing well. He had an overnight hospitalization on 04/23/2012 when he developed sudden onset of severe headache, left-sided numbness, and dysarthria. He had a normal MR I/MRA of the brain and a normal 2-D and transesophageal echocardiogram. Not clear whether this was atypical migraine or related to a hypoglycemic episode. Of note he is not diabetic and not on insulin.Marland Kitchen He has had no subsequent episodes. He was put on aspirin.  On the other complaint today is intermittent bilateral flank pain. No urinary tract symptoms. No hematuria. No trauma. He does exercise but does not use weights.  Medications: reviewed  Allergies:  Allergies  Allergen Reactions  . Iodine Anaphylaxis  . Codeine     GI upset  . Erythromycin   . Shellfish Allergy     Review of Systems: Constitutional:   No constitutional symptoms Respiratory: No cough or dyspnea Cardiovascular:  No chest pain or palpitations Gastrointestinal: No abdominal pain or change in bowel habit Genito-Urinary: See above Musculoskeletal: See above Neurologic: See above Skin: No rash Remaining ROS negative.  Physical Exam: Blood pressure 122/66, pulse 82, temperature 98 F  (36.7 C), temperature source Oral, resp. rate 20, height 5' 10.5" (1.791 m), weight 147 lb 12.8 oz (67.042 kg). Wt Readings from Last 3 Encounters:  07/17/12 147 lb 12.8 oz (67.042 kg)  04/24/12 142 lb 6.4 oz (64.592 kg)     General appearance: Well-nourished Caucasian man HENNT: Pharynx no erythema or exudate Lymph nodes: No adenopathy Breasts: Lungs: Clear to auscultation resonant to percussion Heart: Regular rhythm no murmur, no gallop, no click Abdomen: Soft, nontender, no mass, no organomegaly, and there is bilateral flank tenderness right greater than left Extremities: No edema, no calf tenderness Vascular: No cyanosis Neurologic: Mental status intact, PERRLA, optic disc sharp, vessels normal, motor strength 5 over 5, reflexes absent but symmetric at the knees, 1+ symmetric at the biceps, coordination, gait are normal Skin: No rash, no ecchymosis GU: Single right testicle no mass. No inguinal adenopathy  Lab Results: Lab Results  Component Value Date   WBC 7.8 04/24/2012   HGB 14.1 04/24/2012   HCT 42.0 04/24/2012   MCV 87.3 04/24/2012   PLT 230 04/24/2012     Chemistry      Component Value Date/Time   NA 141 04/24/2012 0700   K 4.0 04/24/2012 0700   CL 105 04/24/2012 0700   CO2 28 04/24/2012 0700   BUN 18 04/24/2012 0700   CREATININE 0.92 04/24/2012 0700      Component Value Date/Time   CALCIUM 9.4 04/24/2012 0700   ALKPHOS 57 04/24/2012 0700   AST 13 04/24/2012 0700   ALT 13 04/24/2012 0700   BILITOT 0.7 04/24/2012 0700  Radiological Studies: See discussion above   Impression and Plan: #1. Stage I seminoma left testicle treated as outlined above. He remains free of any obvious recurrence now out 6-1/2 years from diagnosis. He is likely cured.  #2. Atypical migraine-see discussion above  #3. Atypical bilateral flank pain. I'm going to get a regular chest x-ray and an abdominal ultrasound at this time. My clinical suspicion for any pathology is low.   CC:.  Dr. Blair Heys; Dr. Barron Alvine; Dr. Margaretmary Dys; Dr. Porfirio Mylar Dohmeier   Levert Feinstein, MD 11/15/20137:08 PM

## 2012-07-24 ENCOUNTER — Telehealth: Payer: Self-pay | Admitting: *Deleted

## 2012-07-24 ENCOUNTER — Ambulatory Visit (HOSPITAL_COMMUNITY)
Admission: RE | Admit: 2012-07-24 | Discharge: 2012-07-24 | Disposition: A | Discharge status: Home / Self Care | Payer: BC Managed Care – PPO | Source: Ambulatory Visit | Attending: Oncology | Admitting: Oncology

## 2012-07-24 DIAGNOSIS — R109 Unspecified abdominal pain: Secondary | ICD-10-CM | POA: Insufficient documentation

## 2012-07-24 DIAGNOSIS — C6290 Malignant neoplasm of unspecified testis, unspecified whether descended or undescended: Secondary | ICD-10-CM

## 2012-07-24 NOTE — Telephone Encounter (Signed)
Message copied by Sabino Snipes on Fri Jul 24, 2012 12:17 PM ------      Message from: Levert Feinstein      Created: Fri Jul 24, 2012 10:13 AM       Call pt  US abdomen/kidneys  Normal  No stones or other pathology

## 2012-07-24 NOTE — Telephone Encounter (Signed)
Notified pt of [place] abd/kidneys per Dr Cyndie Chime.  Pt still having pain & encouraged to call MD if worse.

## 2012-10-05 ENCOUNTER — Other Ambulatory Visit: Payer: Self-pay | Admitting: Dermatology

## 2013-06-07 ENCOUNTER — Telehealth: Payer: Self-pay | Admitting: *Deleted

## 2013-06-07 NOTE — Telephone Encounter (Signed)
Patient needs appointment for follow- up.  Let him know that he has an appt. Already on 07/23/13.  Will check with Dr. Cyndie Chime and see if he needs any labs on that day?

## 2013-07-23 ENCOUNTER — Telehealth: Payer: Self-pay | Admitting: Oncology

## 2013-07-23 ENCOUNTER — Encounter (INDEPENDENT_AMBULATORY_CARE_PROVIDER_SITE_OTHER): Payer: Self-pay

## 2013-07-23 ENCOUNTER — Ambulatory Visit (HOSPITAL_COMMUNITY)
Admission: RE | Admit: 2013-07-23 | Discharge: 2013-07-23 | Disposition: A | Discharge status: Home / Self Care | Payer: BC Managed Care – PPO | Source: Ambulatory Visit | Attending: Oncology | Admitting: Oncology

## 2013-07-23 ENCOUNTER — Ambulatory Visit (HOSPITAL_BASED_OUTPATIENT_CLINIC_OR_DEPARTMENT_OTHER): Payer: BC Managed Care – PPO | Admitting: Lab

## 2013-07-23 ENCOUNTER — Ambulatory Visit (HOSPITAL_BASED_OUTPATIENT_CLINIC_OR_DEPARTMENT_OTHER): Payer: BC Managed Care – PPO | Admitting: Oncology

## 2013-07-23 VITALS — BP 111/63 | HR 78 | Temp 97.9°F | Resp 20 | Ht 70.5 in | Wt 146.4 lb

## 2013-07-23 DIAGNOSIS — C6292 Malignant neoplasm of left testis, unspecified whether descended or undescended: Secondary | ICD-10-CM

## 2013-07-23 DIAGNOSIS — R109 Unspecified abdominal pain: Secondary | ICD-10-CM

## 2013-07-23 DIAGNOSIS — C6290 Malignant neoplasm of unspecified testis, unspecified whether descended or undescended: Secondary | ICD-10-CM

## 2013-07-23 DIAGNOSIS — K5900 Constipation, unspecified: Secondary | ICD-10-CM

## 2013-07-23 DIAGNOSIS — Z8547 Personal history of malignant neoplasm of testis: Secondary | ICD-10-CM | POA: Insufficient documentation

## 2013-07-23 LAB — COMPREHENSIVE METABOLIC PANEL (CC13)
ALT: 15 U/L (ref 0–55)
AST: 14 U/L (ref 5–34)
Albumin: 4.2 g/dL (ref 3.5–5.0)
Alkaline Phosphatase: 65 U/L (ref 40–150)
Anion Gap: 10 mEq/L (ref 3–11)
BUN: 21.1 mg/dL (ref 7.0–26.0)
CO2: 24 mEq/L (ref 22–29)
Calcium: 9.7 mg/dL (ref 8.4–10.4)
Chloride: 105 mEq/L (ref 98–109)
Creatinine: 0.9 mg/dL (ref 0.7–1.3)
Glucose: 105 mg/dl (ref 70–140)
Potassium: 4.3 mEq/L (ref 3.5–5.1)
Sodium: 138 mEq/L (ref 136–145)
Total Bilirubin: 1.02 mg/dL (ref 0.20–1.20)
Total Protein: 7.4 g/dL (ref 6.4–8.3)

## 2013-07-23 LAB — CBC WITH DIFFERENTIAL/PLATELET
BASO%: 0.8 % (ref 0.0–2.0)
Basophils Absolute: 0.1 10*3/uL (ref 0.0–0.1)
EOS%: 4.7 % (ref 0.0–7.0)
Eosinophils Absolute: 0.3 10*3/uL (ref 0.0–0.5)
HCT: 45.2 % (ref 38.4–49.9)
HGB: 14.7 g/dL (ref 13.0–17.1)
LYMPH%: 31.1 % (ref 14.0–49.0)
MCH: 29 pg (ref 27.2–33.4)
MCHC: 32.6 g/dL (ref 32.0–36.0)
MCV: 89.2 fL (ref 79.3–98.0)
MONO#: 0.4 10*3/uL (ref 0.1–0.9)
MONO%: 6.1 % (ref 0.0–14.0)
NEUT#: 4.1 10*3/uL (ref 1.5–6.5)
NEUT%: 57.3 % (ref 39.0–75.0)
Platelets: 239 10*3/uL (ref 140–400)
RBC: 5.07 10*6/uL (ref 4.20–5.82)
RDW: 13.1 % (ref 11.0–14.6)
WBC: 7.2 10*3/uL (ref 4.0–10.3)
lymph#: 2.2 10*3/uL (ref 0.9–3.3)

## 2013-07-23 LAB — LACTATE DEHYDROGENASE (CC13): LDH: 134 U/L (ref 125–245)

## 2013-07-23 LAB — TSH CHCC: TSH: 0.841 m(IU)/L (ref 0.320–4.118)

## 2013-07-23 NOTE — Telephone Encounter (Signed)
Called pt and left message regarding CT , left telephone number to radiology scheduling

## 2013-07-25 NOTE — Progress Notes (Signed)
Hematology and Oncology Follow Up Visit  Manuel [name] [id number] 08/08/1981 32 y.o. 07/25/2013 5:08 PM   Principle Diagnosis: Encounter Diagnoses  Name Primary?  . Testicular cancer, left   . Abdominal pain, lower, unspecified laterality   . 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 He was treated with a radical inguinal orchiectomy by Dr. Barron [name] and subsequently received radiation 25.6 Gy in 1.6 Gy fractions 12/12/2004 through 01/02/2005 by Dr. Margaretmary [name]. Initial HCG tumor marker borderline elevated at 5 units, normal less than 3, with a normal alpha fetoprotein. Staging evaluation otherwise unremarkable including CT scans of the chest, abdomen and pelvis.   He and his wife were able to conceive a child.   He had an overnight hospitalization on 04/23/2012 when he developed sudden onset of severe headache, left-sided numbness, and dysarthria. He had a normal MR I/MRA of the brain and a normal 2-D and transesophageal echocardiogram. Not clear whether this was atypical migraine or related to a hypoglycemic episode. Of note he is not diabetic and not on insulin.Marland Kitchen He has had no subsequent episodes. He was put on aspirin.  New complaint today is persistent and progressive aching pain in his lower abdomen. No diarrhea, hematochezia, melena, or change in bowel habit. This has been going on for about 8 months. Not exacerbated by food.   Medications: reviewed  Allergies:  Allergies  Allergen Reactions  . Iodine Anaphylaxis  . Codeine     GI upset  . Erythromycin   . Shellfish Allergy     Review of Systems:  ENT ROS: No sore throat  Respiratory ROS: No cough or dyspnea Cardiovascular ROS:  No chest pain or palpitations Gastrointestinal ROS:   See above Genito-Urinary ROS: No urinary tract symptoms Musculoskeletal ROS: No muscle or bone  pain Neurological ROS: No headache or change in vision Dermatological ROS: No rash or ecchymosis Remaining ROS negative  Physical Exam: Blood pressure 111/63, pulse 78, temperature 97.9 F (36.6 C), temperature source Oral, resp. rate 20, height 5' 10.5" (1.791 m), weight 146 lb 6.4 oz (66.407 kg), SpO2 100.00%. Wt Readings from Last 3 Encounters:  07/23/13 146 lb 6.4 oz (66.407 kg)  07/17/12 147 lb 12.8 oz (67.042 kg)  04/24/12 142 lb 6.4 oz (64.592 kg)     General appearance: Well-nourished Caucasian man Manuel [name]: Pharynx no erythema, exudate, mass, or ulcer. No thyromegaly or thyroid nodules Lymph nodes: No cervical, supraclavicular, or axillary lymphadenopathy Breasts:  Lungs: Clear to auscultation, resonant to percussion throughout Heart: Regular rhythm, no murmur, no gallop, no rub, no click, no edema Abdomen: Soft, nontender, normal bowel sounds, no mass, no organomegaly Extremities: No edema, no calf tenderness Musculoskeletal: no joint deformities GU: Single right testicle. No inguinal adenopathy Vascular: Carotid pulses 2+, no bruits, distal pulses: Dorsalis pedis 1+ symmetric Neurologic: Alert, oriented, PERRLA, cranial nerves grossly normal, motor strength 5 over 5, reflexes 1+ symmetric, upper body coordination normal, gait normal, Skin: No rash or ecchymosis  Lab Results: CBC W/Diff    Component Value Date/Time   WBC 7.2 07/23/2013 1513   WBC 7.8 04/24/2012 0700   RBC 5.07 07/23/2013 1513   RBC 4.81 04/24/2012 0700   HGB 14.7 07/23/2013 1513   HGB 14.1 04/24/2012 0700   HCT 45.2 07/23/2013 1513   HCT 42.0 04/24/2012 0700   PLT 239 07/23/2013 1513   PLT 230 04/24/2012  0700   MCV 89.2 07/23/2013 1513   MCV 87.3 04/24/2012 0700   MCH 29.0 07/23/2013 1513   MCH 29.3 04/24/2012 0700   MCHC 32.6 07/23/2013 1513   MCHC 33.6 04/24/2012 0700   RDW 13.1 07/23/2013 1513   RDW 12.7 04/24/2012 0700   LYMPHSABS 2.2 07/23/2013 1513   LYMPHSABS 2.7 04/24/2012 0700   MONOABS 0.4  07/23/2013 1513   MONOABS 0.6 04/24/2012 0700   EOSABS 0.3 07/23/2013 1513   EOSABS 0.4 04/24/2012 0700   BASOSABS 0.1 07/23/2013 1513   BASOSABS 0.1 04/24/2012 0700     Chemistry      Component Value Date/Time   NA 138 07/23/2013 1513   NA 141 04/24/2012 0700   K 4.3 07/23/2013 1513   K 4.0 04/24/2012 0700   CL 105 04/24/2012 0700   CO2 24 07/23/2013 1513   CO2 28 04/24/2012 0700   BUN 21.1 07/23/2013 1513   BUN 18 04/24/2012 0700   CREATININE 0.9 07/23/2013 1513   CREATININE 0.92 04/24/2012 0700      Component Value Date/Time   CALCIUM 9.7 07/23/2013 1513   CALCIUM 9.4 04/24/2012 0700   ALKPHOS 65 07/23/2013 1513   ALKPHOS 57 04/24/2012 0700   AST 14 07/23/2013 1513   AST 13 04/24/2012 0700   ALT 15 07/23/2013 1513   ALT 13 04/24/2012 0700   BILITOT 1.02 07/23/2013 1513   BILITOT 0.7 04/24/2012 0700       Radiological Studies: Dg Chest 2 View  07/23/2013   CLINICAL DATA:  History of testicular carcinoma  EXAM: CHEST  2 VIEW  COMPARISON:  July 24, 2012  FINDINGS: Lungs are clear. Heart size and pulmonary vascularity are normal. No adenopathy. No bone lesions. There is slight upper thoracic levoscoliosis.  IMPRESSION: Lungs clear.  No neoplastic focus appreciable.   Electronically Signed   By: Bretta Bang M.D.   On: 07/23/2013 15:50    Impression:  #1. Stage I seminoma left testicle treated as outlined above He is out over 8 years from diagnosis and treatment and likely cured at this point.  #2. Atypical abdominal aching pain Since this has been persistent and progressive, and then to go ahead and get a CT scan to reassure both of [place] that there is nothing else serious going on. If the CT is negative, I told him I felt comfortable letting him graduate from this practice to the exclusive followup with his primary care physician. Abdomen during physical examinations and chest radiographs on an annual basis. I am not routinely getting tumor markers.  #3. History of an  atypical neurologic event occurring over one year ago.   CC: Patient Care Team: Blair Heys, MD as PCP - General (Family Medicine)   Levert Feinstein, MD 11/23/20145:08 PM

## 2013-07-28 ENCOUNTER — Telehealth: Payer: Self-pay | Admitting: *Deleted

## 2013-07-28 NOTE — Telephone Encounter (Signed)
Late entry:  This message was also given to pt 07/24/13.

## 2013-07-28 NOTE — Telephone Encounter (Signed)
Message copied by Sabino Snipes on Wed Jul 28, 2013  1:26 PM ------      Message from: Levert Feinstein      Created: Fri Jul 23, 2013  6:04 PM       Call pt - lab all OK ------

## 2013-08-02 ENCOUNTER — Telehealth: Payer: Self-pay | Admitting: *Deleted

## 2013-08-02 NOTE — Telephone Encounter (Signed)
Message copied by Gala Romney on Mon Aug 02, 2013  3:38 PM ------      Message from: Levert Feinstein      Created: Sat Jul 24, 2013  2:30 PM       Call pt CXR normal ------

## 2013-08-02 NOTE — Telephone Encounter (Signed)
Per MD; pt notified CXR normal.  Pt verbalized understanding and expressed appreciation for call.

## 2013-08-05 ENCOUNTER — Other Ambulatory Visit: Payer: Self-pay | Admitting: Oncology

## 2013-08-05 ENCOUNTER — Telehealth: Payer: Self-pay | Admitting: *Deleted

## 2013-08-05 DIAGNOSIS — C6291 Malignant neoplasm of right testis, unspecified whether descended or undescended: Secondary | ICD-10-CM

## 2013-08-05 DIAGNOSIS — R109 Unspecified abdominal pain: Secondary | ICD-10-CM

## 2013-08-05 NOTE — Telephone Encounter (Signed)
Manuel [name] from Radiology called--states that pt requesting to wait to have CT done after the new year due to pt's insurance.  Also, order is WITH contrast and pt is very allergic to contrast and needs order changed to without. Note to Dr. Cyndie Chime.

## 2013-09-08 ENCOUNTER — Telehealth: Payer: Self-pay | Admitting: *Deleted

## 2013-09-08 NOTE — Telephone Encounter (Signed)
Patient calls.  He was scheduled for a CT scan for abdominal pain noted at 07-23-13 visit.  This was postponed for patient preference due to insurance issues.  Since then patient has elimated at artificially flavored and artificially flavored protein drink and his stomach problems have been completely gone for the last 2 weeks.  Therefore he does not want to have the CT scan.  Will verify with Dr. Beryle Beams and call him back @ [phone]. Dr. Beryle Beams is fine with this. Left message on his voice mail and I will cancel CT scan.  Also included that if the pain returns he is to call us back.  Called radiology and cancelled scan with Amber.

## 2013-09-14 ENCOUNTER — Ambulatory Visit (HOSPITAL_COMMUNITY): Payer: BC Managed Care – PPO

## 2014-01-17 ENCOUNTER — Other Ambulatory Visit: Payer: Self-pay | Admitting: Dermatology

## 2014-03-25 ENCOUNTER — Ambulatory Visit
Admission: RE | Admit: 2014-03-25 | Discharge: 2014-03-25 | Disposition: A | Discharge status: Home / Self Care | Payer: BC Managed Care – PPO | Source: Ambulatory Visit | Attending: Physician Assistant | Admitting: Physician Assistant

## 2014-03-25 ENCOUNTER — Other Ambulatory Visit: Payer: Self-pay | Admitting: Physician Assistant

## 2014-03-25 DIAGNOSIS — R0602 Shortness of breath: Secondary | ICD-10-CM

## 2014-03-25 DIAGNOSIS — R05 Cough: Secondary | ICD-10-CM

## 2014-03-25 DIAGNOSIS — R059 Cough, unspecified: Secondary | ICD-10-CM

## 2014-03-25 DIAGNOSIS — R509 Fever, unspecified: Secondary | ICD-10-CM

## 2014-04-08 ENCOUNTER — Observation Stay (HOSPITAL_COMMUNITY)
Admission: EM | Admit: 2014-04-08 | Discharge: 2014-04-08 | Disposition: A | Discharge status: Home / Self Care | Payer: BC Managed Care – PPO | Attending: Emergency Medicine | Admitting: Emergency Medicine

## 2014-04-08 ENCOUNTER — Encounter (HOSPITAL_COMMUNITY): Payer: Self-pay | Admitting: Emergency Medicine

## 2014-04-08 DIAGNOSIS — R509 Fever, unspecified: Secondary | ICD-10-CM | POA: Diagnosis present

## 2014-04-08 DIAGNOSIS — R031 Nonspecific low blood-pressure reading: Secondary | ICD-10-CM | POA: Insufficient documentation

## 2014-04-08 DIAGNOSIS — Z7982 Long term (current) use of aspirin: Secondary | ICD-10-CM | POA: Diagnosis not present

## 2014-04-08 DIAGNOSIS — R519 Headache, unspecified: Secondary | ICD-10-CM

## 2014-04-08 DIAGNOSIS — A879 Viral meningitis, unspecified: Principal | ICD-10-CM | POA: Diagnosis present

## 2014-04-08 DIAGNOSIS — I959 Hypotension, unspecified: Secondary | ICD-10-CM

## 2014-04-08 DIAGNOSIS — Z8547 Personal history of malignant neoplasm of testis: Secondary | ICD-10-CM | POA: Insufficient documentation

## 2014-04-08 DIAGNOSIS — R51 Headache: Secondary | ICD-10-CM

## 2014-04-08 HISTORY — DX: Viral meningitis, unspecified: A87.9

## 2014-04-08 LAB — CBC WITH DIFFERENTIAL/PLATELET
Basophils Absolute: 0 10*3/uL (ref 0.0–0.1)
Basophils Relative: 1 % (ref 0–1)
Eosinophils Absolute: 0 10*3/uL (ref 0.0–0.7)
Eosinophils Relative: 1 % (ref 0–5)
HCT: 40.7 % (ref 39.0–52.0)
Hemoglobin: 13.5 g/dL (ref 13.0–17.0)
Lymphocytes Relative: 21 % (ref 12–46)
Lymphs Abs: 1.2 10*3/uL (ref 0.7–4.0)
MCH: 29.6 pg (ref 26.0–34.0)
MCHC: 33.2 g/dL (ref 30.0–36.0)
MCV: 89.3 fL (ref 78.0–100.0)
Monocytes Absolute: 0.5 10*3/uL (ref 0.1–1.0)
Monocytes Relative: 8 % (ref 3–12)
Neutro Abs: 3.8 10*3/uL (ref 1.7–7.7)
Neutrophils Relative %: 69 % (ref 43–77)
Platelets: 314 10*3/uL (ref 150–400)
RBC: 4.56 MIL/uL (ref 4.22–5.81)
RDW: 13.1 % (ref 11.5–15.5)
WBC: 5.5 10*3/uL (ref 4.0–10.5)

## 2014-04-08 LAB — CSF CELL COUNT WITH DIFFERENTIAL
RBC Count, CSF: 0 /mm3
RBC Count, CSF: 4 /mm3 — ABNORMAL HIGH
Tube #: 1
WBC, CSF: 1 /mm3 (ref 0–5)
WBC, CSF: 3 /mm3 (ref 0–5)

## 2014-04-08 LAB — BASIC METABOLIC PANEL
Anion gap: 14 (ref 5–15)
BUN: 18 mg/dL (ref 6–23)
CO2: 26 mEq/L (ref 19–32)
Calcium: 9 mg/dL (ref 8.4–10.5)
Chloride: 95 mEq/L — ABNORMAL LOW (ref 96–112)
Creatinine, Ser: 0.98 mg/dL (ref 0.50–1.35)
GFR calc Af Amer: >90 mL/min (ref >90)
GFR calc non Af Amer: >90 mL/min (ref >90)
Glucose, Bld: 89 mg/dL (ref 70–99)
Potassium: 4.3 mEq/L (ref 3.7–5.3)
Sodium: 135 mEq/L — ABNORMAL LOW (ref 137–147)

## 2014-04-08 LAB — PROTEIN, CSF: Total  Protein, CSF: 15 mg/dL (ref 15–45)

## 2014-04-08 LAB — I-STAT CG4 LACTIC ACID, ED: Lactic Acid, Venous: 0.67 mmol/L (ref 0.5–2.2)

## 2014-04-08 LAB — GLUCOSE, CSF: Glucose, CSF: 60 mg/dL (ref 43–76)

## 2014-04-08 MED ORDER — IBUPROFEN 800 MG PO TABS
800.0000 mg | ORAL_TABLET | Freq: Once | ORAL | Status: AC
  Administered 2014-04-08: 800 mg via ORAL
  Filled 2014-04-08: qty 1

## 2014-04-08 MED ORDER — ONDANSETRON HCL 4 MG/2ML IJ SOLN: 4.0000 mg | Freq: Three times a day (TID) | INTRAMUSCULAR | Status: DC | PRN

## 2014-04-08 MED ORDER — SODIUM CHLORIDE 0.9 % IV BOLUS (SEPSIS)
2000.0000 mL | Freq: Once | INTRAVENOUS | Status: AC
  Administered 2014-04-08: 2000 mL via INTRAVENOUS

## 2014-04-08 MED ORDER — ONDANSETRON HCL 4 MG/2ML IJ SOLN
4.0000 mg | Freq: Once | INTRAMUSCULAR | Status: DC
  Filled 2014-04-08: qty 2

## 2014-04-08 MED ORDER — DOXYCYCLINE HYCLATE 100 MG IV SOLR
100.0000 mg | Freq: Once | INTRAVENOUS | Status: AC
  Administered 2014-04-08: 100 mg via INTRAVENOUS
  Filled 2014-04-08: qty 100

## 2014-04-08 MED ORDER — DOXYCYCLINE HYCLATE 100 MG PO CAPS: 100.0000 mg | ORAL_CAPSULE | Freq: Two times a day (BID) | ORAL | Status: DC

## 2014-04-08 NOTE — ED Notes (Signed)
LP tray at bedside

## 2014-04-08 NOTE — ED Notes (Signed)
Hospitalist at bedside assessing patient for admission

## 2014-04-08 NOTE — Discharge Instructions (Signed)
1. Medications: doxycycline, usual home medications 2. Treatment: rest, drink plenty of fluids, motrin for headache 3. Follow Up: Please followup with your primary doctor in 3 days for discussion of your diagnoses and further evaluation after today's visit; Return to the ED for new or worsening symptoms   Viral Meningitis Meningitis is an inflammation of the fluid and membranes surrounding the spinal cord and brain. Viral meningitis is caused by a viral infection. It is important to know whether a virus or bacterium is causing your meningitis. Viral meningitis is generally less severe than bacterial meningitis. Aseptic meningitis is any meningitis not caused by a bacterial infection, including viral meningitis. Meningitis can also be caused by fungi, parasites, certain medicines, cancer, and autoimmune disorders. Uncomplicated meningitis usually resolves on its own in 7 to 10 days.However, there can be complicated cases that last much longer. People with lowered immune systems are at greater risk for poor outcomes from meningitis.It is important to get treatment as soon as possible to minimize the impact of a meningitis infection. Long-term complications could include seizures, hearing loss, weakness, paralysis, blindness, or cognitive impairment. CAUSES Most cases of meningitis are due to viruses. Viruses that cause meningitis include enteroviruses (such as polio and coxsackie viruses), herpes simplex virus, varicella zoster virus, mumps, and HIV. SYMPTOMS  Symptoms can develop over many hours. They may even take a few days to develop. Common symptoms of meningitis in people over the age of 2 years include:  High fever.  Headache.  Stiff neck.  Irritability.  Nausea and vomiting.  Fatigue.  Discomfort from exposure to light.  Discomfort from exposure to loud noise.  Trouble walking.  Altered mental status.  Seizures. DIAGNOSIS  Early diagnosis and treatment are very important.  If you have symptoms, you should see your caregiver right away. Your evaluation may include lab tests of your blood and spinal fluid. A spinal fluid sample is taken through a procedure called a lumbar puncture or spinal tap. During the procedure, a needle is inserted into an area in the lower back. You may also have a CT scan of your brain as part of your evaluation.If there is suspicion of an infection or inflammation of the brain (encephalitis), an MRI scan may be done. TREATMENT   Antibiotics are not effective against a virus. Antiviral medicines may be used depending on the cause of your meningitis.  Treatment for viral meningitis focuses on reducing your symptoms. This may include rest, hydration, and medicines to reduce fever and pain.  Steroids may also be used if there is significant swelling of the brain. PREVENTION  Vaccines can help prevent meningitis caused by polio, measles, and mumps.  Strict hand washing is effective in controlling the spread of many causes of meningitis.  Using barrier protection during sexual intercourse can prevent the spread of meningitis caused by viruses such as the herpes virus or HIV.  Protection from mosquitoes, especially for the very young and very old, can prevent some causes of meningitis. HOME CARE INSTRUCTIONS  Rest.  Drink enough water and fluids to keep your urine clear or pale yellow.  Take all medicine as prescribed.  Practice good hygiene to prevent others from getting sick.  Follow up with your caregiver as directed. SEEK IMMEDIATE MEDICAL CARE IF:  You develop dizziness.  You have a very fast heartbeat.  You have difficulty breathing.  You develop confusion.  You have seizures.  You have unstoppable nausea and vomiting.  You develop any worsening symptoms. MAKE SURE YOU:  Understand these instructions.  Will watch your condition.  Will get help right away if you are not doing well or get worse. Document Released:  08/09/2002 Document Revised: 11/11/2011 Document Reviewed: 12/04/2010 [facility] Patient Information 2015 [place], [place]. This information is not intended to replace advice given to you by your health care provider. Make sure you discuss any questions you have with your health care provider.

## 2014-04-08 NOTE — ED Provider Notes (Signed)
CSN: [id number]     Arrival date & time 04/08/14  1658 History   First MD Initiated Contact with Patient 04/08/14 1703     Chief Complaint  Patient presents with  . Fever  . Neck Pain     (Consider location/radiation/quality/duration/timing/severity/associated sxs/prior Treatment) Patient is a 33 y.o. male presenting with fever and neck pain. The history is provided by the patient and medical records. No language interpreter was used.  Fever Associated symptoms: headaches   Associated symptoms: no chest pain, no cough, no diarrhea, no dysuria, no nausea, no rash and no vomiting   Neck Pain Associated symptoms: fever and headaches   Associated symptoms: no chest pain     ROLLINS WRIGHTSON III is a 33 y.o. male  with a hx of testicular cancer, concussion presents to the Emergency Department complaining of gradual, persistent, progressively worsening headache, neck pain and low grade fevers onset 3 days ago.  Pt reports that 3 weeks ago he was very sick with fevers, cough, sore throat, body aches.  Pt reports he improved and his fever lessened.  Then, his fever returned with a TMAX of 102, about 4-5 days ago and has been waxing and waning, raising at night.  Pt reports taking ASA 3 days ago, but switched to motrin yesterday.  Associated symptoms include neck stiffness, lower lumbar pain, nausea and several episodes of vomiting (earlier this week).  He reports the headache is generalized, throbbing, rated at a 9/10 with associated photophobia.  Nothing makes the headache better and leaning down makes it worse.  Pt denies chest pain, SOB, dysuria, hematuria, diarrhea.  Pt reports several tick bites in the last month without associated rash.  Pt also endorses family members with recent viral URI illness.    Past Medical History  Diagnosis Date  . Testicular cancer 2006  . Concussion     pt reports he has hx of multiple concussions, last one was 2009  . Testicular cancer 07/17/2012  . Bilateral  flank pain 07/17/2012   Past Surgical History  Procedure Laterality Date  . Surgery scrotal / testicular  2006   Family History  Problem Relation Age of Onset  . Diabetes type II Father    History  Substance Use Topics  . Smoking status: Never Smoker   . Smokeless tobacco: Not on file  . Alcohol Use: 0.6 oz/week    1 Glasses of wine per week     Comment: occainsionally    Review of Systems  Constitutional: Positive for fever. Negative for diaphoresis, appetite change, fatigue and unexpected weight change.  HENT: Negative for mouth sores.   Eyes: Negative for visual disturbance.  Respiratory: Negative for cough, chest tightness, shortness of breath and wheezing.   Cardiovascular: Negative for chest pain.  Gastrointestinal: Negative for nausea, vomiting, abdominal pain, diarrhea and constipation.  Endocrine: Negative for polydipsia, polyphagia and polyuria.  Genitourinary: Negative for dysuria, urgency, frequency and hematuria.  Musculoskeletal: Positive for neck pain. Negative for back pain and neck stiffness.  Skin: Negative for rash.  Allergic/Immunologic: Negative for immunocompromised state.  Neurological: Positive for headaches. Negative for syncope and light-headedness.  Hematological: Does not bruise/bleed easily.  Psychiatric/Behavioral: Negative for sleep disturbance. The patient is not nervous/anxious.       Allergies  Iodine; Shellfish allergy; Codeine; and Erythromycin  Home Medications   Prior to Admission medications   Medication Sig Start Date End Date Taking? Authorizing Provider  aspirin 325 MG tablet Take 325 mg by mouth every 6 (  six) hours as needed for headache.    Yes Historical Provider, MD  ibuprofen (ADVIL,MOTRIN) 200 MG tablet Take 400 mg by mouth every 6 (six) hours as needed for moderate pain.   Yes Historical Provider, MD  doxycycline (VIBRAMYCIN) 100 MG capsule Take 1 capsule (100 mg total) by mouth 2 (two) times daily. 04/08/14   Klayten Jolliff, PA-C   BP 104/63  Pulse 74  Temp(Src) 100.2 F (37.9 C) (Oral)  Resp 16  Ht 5\' 10"  (1.778 m)  Wt 147 lb (66.679 kg)  BMI 21.09 kg/m2  SpO2 99% Physical Exam  Nursing note and vitals reviewed. Constitutional: He is oriented to person, place, and time. He appears well-developed and well-nourished. No distress.  HENT:  Head: Normocephalic and atraumatic.  Mouth/Throat: Oropharynx is clear and moist.  Eyes: Conjunctivae and EOM are normal. Pupils are equal, round, and reactive to light. No scleral icterus.  No horizontal, vertical or rotational nystagmus  Neck: Neck supple. No spinous process tenderness and no muscular tenderness present. Decreased range of motion present. Brudzinski's sign and Kernig's sign noted.  Significantly decreased ROM with pain No midline or paraspinal tenderness Mild nuchal rigidity with meningeal signs  Cardiovascular: Normal rate, regular rhythm, normal heart sounds and intact distal pulses.   No murmur heard. Pulmonary/Chest: Effort normal and breath sounds normal. No respiratory distress. He has no wheezes. He has no rales.  Abdominal: Soft. Bowel sounds are normal. There is no tenderness. There is no rebound and no guarding.  Lymphadenopathy:    He has no cervical adenopathy.  Neurological: He is alert and oriented to person, place, and time. He has normal reflexes. No cranial nerve deficit. He exhibits normal muscle tone. Coordination normal.  Mental Status:  Alert, oriented, thought content appropriate. Speech fluent without evidence of aphasia. Able to follow 2 step commands without difficulty.  Cranial Nerves:  II:  Peripheral visual fields grossly normal, pupils equal, round, reactive to light III,IV, VI: ptosis not present, extra-ocular motions intact bilaterally  V,VII: smile symmetric, facial light touch sensation equal VIII: hearing grossly normal bilaterally  IX,X: gag reflex present  XI: bilateral shoulder shrug equal and  strong XII: midline tongue extension  Motor:  5/5 in upper and lower extremities bilaterally including strong and equal grip strength and dorsiflexion/plantar flexion Sensory: Pinprick and light touch normal in all extremities.  Deep Tendon Reflexes: 2+ and symmetric  Cerebellar: normal finger-to-nose with bilateral upper extremities Gait: normal gait and balance CV: distal pulses palpable throughout   Skin: Skin is warm and dry. No rash noted. He is not diaphoretic.  Psychiatric: He has a normal mood and affect. His behavior is normal. Judgment and thought content normal.    ED Course  LUMBAR PUNCTURE Date/Time: 04/08/2014 7:03 PM Performed by: Abigail Butts Authorized by: Abigail Butts Consent: Verbal consent obtained. Risks and benefits: risks, benefits and alternatives were discussed Consent given by: patient Patient understanding: patient states understanding of the procedure being performed Patient consent: the patient's understanding of the procedure matches consent given Procedure consent: procedure consent matches procedure scheduled Relevant documents: relevant documents present and verified Site marked: the operative site was marked Required items: required blood products, implants, devices, and special equipment available Patient identity confirmed: verbally with patient and arm band Time out: Immediately prior to procedure a "time out" was called to verify the correct patient, procedure, equipment, support staff and site/side marked as required. Indications: evaluation for infection Anesthesia: local infiltration Local anesthetic: lidocaine 2% without epinephrine Anesthetic  total: 2.5 ml Patient sedated: no Preparation: Patient was prepped and draped in the usual sterile fashion. Lumbar space: L4-L5 interspace Patient's position: sitting Needle gauge: 22 Needle type: spinal needle - Quincke tip Needle length: 2.5 in Number of attempts: 1 Fluid  appearance: clear Tubes of fluid: 4 Total volume: 4.5 ml Post-procedure: site cleaned, pressure dressing applied and adhesive bandage applied Patient tolerance: Patient tolerated the procedure well with no immediate complications.   (including critical care time) Labs Review Labs Reviewed  BASIC METABOLIC PANEL - Abnormal; Notable for the following:    Sodium 135 (*)    Chloride 95 (*)    All other components within normal limits  CSF CELL COUNT WITH DIFFERENTIAL - Abnormal; Notable for the following:    RBC Count, CSF 4 (*)    All other components within normal limits  CSF CULTURE  GRAM STAIN  CBC WITH DIFFERENTIAL  CSF CELL COUNT WITH DIFFERENTIAL  GLUCOSE, CSF  PROTEIN, CSF  ROCKY MTN SPOTTED FVR AB, IGG-BLOOD  ROCKY MTN SPOTTED FVR AB, IGM-BLOOD  LYME DISEASE DNA BY PCR(BORRELIA BURG)  B. BURGDORFI ANTIBODIES, CSF  HERPES SIMPLEX VIRUS(HSV) DNA BY PCR  EHRLICHIA ANTIBODY PANEL  I-STAT CG4 LACTIC ACID, ED    Imaging Review No results found.   EKG Interpretation None      MDM   Final diagnoses:  Viral meningitis  Hypotension, unspecified hypotension type  Fever, unspecified fever cause  Headache, unspecified headache type   Elmo Putt III presents with several days of headache, neck pain and neck stiffness in addition to fevers. Patient with previous viral prodrome. Patient is febrile and not tachycardic in the emergency department.  On exam patient with mild nuchal rigidity and positive meningeal signs. Doubt bacterial meningitis however concern for possible viral meningitis at this time. We'll proceed with lumbar puncture.  7:05 PM Lumbar puncture with clear fluid noted. Patient tolerated well without immediate complication.  Labs reassuring.     9:00PM CSF with 1 and 3 white blood cells in tubes 1 and 4.  Doubt bacterial meningitis. Gram stain pending.  Normal glucose and protein in CSF.  Patient given doxycycline for possible recommend spotted  fever.  11:30PM Patient with several episodes of hypotension dipping into the 48N systolic with associated fatigue and lightheadedness in addition to a general ill feeling.  Concern the patient is not improved while here in the emergency department. I have very low suspicion for bacterial meningitis however I have concerns about recent returning home with hypotensive episodes. We'll proceed with admission.  12:00PM Patient discussed with Dr. Wynelle Cleveland a trial hospitalist. She has evaluated the patient and patient is now refusing admission.  Orthostatic vital signs obtained without further hypertension. Patient reports that he does truly feel better at this time though he did not earlier.  Discussed Gram stain with the lab which is pending for another 6+ hours.  Patient requests discharge home.  This time patient will be discharged home with strict return precautions including return to the emergency department for syncope, seizure, worsening headache or high fevers.  Discussed normal course for viral meningitis and recommended close followup with his labs by calling [facility] tomorrow.  Patient is also to see his primary care provider on Monday.  Patient discharged home with doxycycline.  BP 104/63  Pulse 74  Temp(Src) 100.2 F (37.9 C) (Oral)  Resp 16  Ht 5\' 10"  (1.778 m)  Wt 147 lb (66.679 kg)  BMI 21.09 kg/m2  SpO2 99%  The patient was discussed with and seen by Dr. Darl Householder who agrees with the treatment plan.    Jarrett Soho Lizzete Gough, PA-C 04/09/14 [phone]

## 2014-04-08 NOTE — ED Notes (Signed)
Per Pt and pt wife, pt has been experiencing fever, aches, and neck stiffness x 5 days. Not been vaccinated for meningitis.

## 2014-04-09 LAB — HERPES SIMPLEX VIRUS(HSV) DNA BY PCR
HSV 1 DNA: NOT DETECTED
HSV 2 DNA: NOT DETECTED

## 2014-04-09 NOTE — ED Provider Notes (Signed)
Medical screening examination/treatment/procedure(s) were conducted as a shared visit with non-physician practitioner(s) and myself.  I personally evaluated the patient during the encounter.   EKG Interpretation None      Manuel [name] is a 33 y.o. male here with headache, fever. Tick bite 4 weeks ago. 3 weeks ago with low grade temp and body aches. About 4-5 days ago higher fever and worsening neck stiffness for several days. Denies any rash. In the ED, low grade temp 100.2. Mildly tachy. Appears photophobic, mild neck stiffness. Otherwise neuro exam unremarkable. We counseled him about getting LP to r/o meningitis. We performed LP together. There were 3 WBC in tube 4 and 1 WBC in tube 1 with lymphocytic predominance. Still consider erhlichiosis vs lymph vs RMSF. Given doxy. Briefly hypotensive but given IVF and became normotensive and not orthostatic. Consulted medicine, who feels that patient is stable for d/c. Will d/c home with strict precautions and doxy.    Wandra Arthurs, MD 04/09/14 [phone]

## 2014-04-10 LAB — GRAM STAIN: Gram Stain: NONE SEEN

## 2014-04-11 LAB — ROCKY MTN SPOTTED FVR AB, IGG-BLOOD: RMSF IgG: 0.06 IV

## 2014-04-11 LAB — ROCKY MTN SPOTTED FVR AB, IGM-BLOOD: RMSF IgM: 0.4 IV (ref 0.00–0.89)

## 2014-04-12 ENCOUNTER — Emergency Department (HOSPITAL_COMMUNITY)
Admission: EM | Admit: 2014-04-12 | Discharge: 2014-04-12 | Disposition: A | Discharge status: Home / Self Care | Payer: BC Managed Care – PPO | Attending: Emergency Medicine | Admitting: Emergency Medicine

## 2014-04-12 ENCOUNTER — Encounter (HOSPITAL_COMMUNITY): Payer: Self-pay | Admitting: Emergency Medicine

## 2014-04-12 DIAGNOSIS — G971 Other reaction to spinal and lumbar puncture: Secondary | ICD-10-CM | POA: Insufficient documentation

## 2014-04-12 DIAGNOSIS — Z8547 Personal history of malignant neoplasm of testis: Secondary | ICD-10-CM | POA: Insufficient documentation

## 2014-04-12 DIAGNOSIS — Z8679 Personal history of other diseases of the circulatory system: Secondary | ICD-10-CM | POA: Insufficient documentation

## 2014-04-12 DIAGNOSIS — Z792 Long term (current) use of antibiotics: Secondary | ICD-10-CM | POA: Insufficient documentation

## 2014-04-12 DIAGNOSIS — R51 Headache: Secondary | ICD-10-CM | POA: Diagnosis present

## 2014-04-12 DIAGNOSIS — Z7982 Long term (current) use of aspirin: Secondary | ICD-10-CM | POA: Insufficient documentation

## 2014-04-12 DIAGNOSIS — Z8669 Personal history of other diseases of the nervous system and sense organs: Secondary | ICD-10-CM | POA: Diagnosis not present

## 2014-04-12 HISTORY — DX: Hypotension, unspecified: I95.9

## 2014-04-12 HISTORY — DX: Unspecified convulsions: R56.9

## 2014-04-12 HISTORY — DX: Migraine, unspecified, not intractable, without status migrainosus: G43.909

## 2014-04-12 LAB — CSF CULTURE W GRAM STAIN
Culture: NO GROWTH
Gram Stain: NONE SEEN

## 2014-04-12 LAB — CBC WITH DIFFERENTIAL/PLATELET
Basophils Absolute: 0.1 10*3/uL (ref 0.0–0.1)
Basophils Relative: 1 % (ref 0–1)
Eosinophils Absolute: 0.1 10*3/uL (ref 0.0–0.7)
Eosinophils Relative: 3 % (ref 0–5)
HCT: 39.4 % (ref 39.0–52.0)
Hemoglobin: 13.1 g/dL (ref 13.0–17.0)
Lymphocytes Relative: 42 % (ref 12–46)
Lymphs Abs: 2.2 10*3/uL (ref 0.7–4.0)
MCH: 28.9 pg (ref 26.0–34.0)
MCHC: 33.2 g/dL (ref 30.0–36.0)
MCV: 86.8 fL (ref 78.0–100.0)
Monocytes Absolute: 0.6 10*3/uL (ref 0.1–1.0)
Monocytes Relative: 12 % (ref 3–12)
Neutro Abs: 2.2 10*3/uL (ref 1.7–7.7)
Neutrophils Relative %: 42 % — ABNORMAL LOW (ref 43–77)
Platelets: 349 10*3/uL (ref 150–400)
RBC: 4.54 MIL/uL (ref 4.22–5.81)
RDW: 12.9 % (ref 11.5–15.5)
WBC: 5.2 10*3/uL (ref 4.0–10.5)

## 2014-04-12 LAB — BASIC METABOLIC PANEL
Anion gap: 12 (ref 5–15)
BUN: 16 mg/dL (ref 6–23)
CO2: 27 mEq/L (ref 19–32)
Calcium: 8.8 mg/dL (ref 8.4–10.5)
Chloride: 100 mEq/L (ref 96–112)
Creatinine, Ser: 0.91 mg/dL (ref 0.50–1.35)
GFR calc Af Amer: >90 mL/min (ref >90)
GFR calc non Af Amer: >90 mL/min (ref >90)
Glucose, Bld: 96 mg/dL (ref 70–99)
Potassium: 4.4 mEq/L (ref 3.7–5.3)
Sodium: 139 mEq/L (ref 137–147)

## 2014-04-12 LAB — LYME DISEASE DNA BY PCR(BORRELIA BURG): Lyme Disease(B.burgdorferi)PCR: NOT DETECTED

## 2014-04-12 MED ORDER — ONDANSETRON 4 MG PO TBDP: 4.0000 mg | ORAL_TABLET | Freq: Three times a day (TID) | ORAL | Status: DC | PRN

## 2014-04-12 MED ORDER — OXYCODONE-ACETAMINOPHEN 5-325 MG PO TABS
2.0000 | ORAL_TABLET | Freq: Once | ORAL | Status: AC
  Administered 2014-04-12: 1 via ORAL
  Filled 2014-04-12: qty 2

## 2014-04-12 MED ORDER — OXYCODONE-ACETAMINOPHEN 5-325 MG PO TABS: 1.0000 | ORAL_TABLET | Freq: Four times a day (QID) | ORAL | Status: DC | PRN

## 2014-04-12 NOTE — ED Notes (Signed)
Pt. Reports seen here Friday and diagnosed with viral meningitis, had a lumbar puncture doen. States yesterday site was red with some pink drainage. Today site looks well healed, no redness or warmth noted. Pt. Reports excruciating Lamonte Sakai that has gotten persistently worse.

## 2014-04-12 NOTE — ED Provider Notes (Signed)
CSN: [id number]     Arrival date & time 04/12/14  8 History   First MD Initiated Contact with Patient 04/12/14 1702     Chief Complaint  Patient presents with  . Headache    meningitis   HPI This is a 33 yo man here for assessment of his 10/10 headache. He was cared for in the [facility] ED on 8/7 with headache, neck stiffness, fever and recent tick bite. His LP at that time was negative for Lyme; however, starting Saturday night, his headache returned, much more intensely and has remained since that time. He has noticed that it is least painful when is lying flat on his back; it is worst when he stands, to the point where he has vomited from the pain and has some blurred vision while in pain.   Past Medical History  Diagnosis Date  . Testicular cancer 2006  . Concussion     pt reports he has hx of multiple concussions, last one was 2009  . Testicular cancer 07/17/2012  . Bilateral flank pain 07/17/2012  . Migraine   . Seizure   . Hypotension    Past Surgical History  Procedure Laterality Date  . Surgery scrotal / testicular  2006   Family History  Problem Relation Age of Onset  . Diabetes type II Father    History  Substance Use Topics  . Smoking status: Never Smoker   . Smokeless tobacco: Not on file  . Alcohol Use: 0.6 oz/week    1 Glasses of wine per week     Comment: occainsionally    Review of Systems General: has been well until recent episode of headache, no sick contacts Skin: small rash on left arm after starting doxycycline HEENT: see HPI, no changes in hearing Cardiac: no chest pain or palpitations Respiratory: no shortness of breath GI: no changes in BMs Urinary: no changes in urination Msk: some pain and swelling around area of LP, now resolved Psychiatric: no history of anxiety or depression    Allergies  Iodine; Shellfish allergy; Codeine; and Erythromycin  Home Medications   Prior to Admission medications   Medication Sig Start Date End Date  Taking? Authorizing Provider  aspirin 325 MG tablet Take 325 mg by mouth every 6 (six) hours as needed for headache.     Historical Provider, MD  doxycycline (VIBRAMYCIN) 100 MG capsule Take 1 capsule (100 mg total) by mouth 2 (two) times daily. 04/08/14   Hannah Muthersbaugh, PA-C  ibuprofen (ADVIL,MOTRIN) 200 MG tablet Take 400 mg by mouth every 6 (six) hours as needed for moderate pain.    Historical Provider, MD   BP 110/63  Pulse 77  Temp(Src) 97.8 F (36.6 C) (Oral)  Resp 18  SpO2 100% Physical Exam Appearance: lying FLAT in bed and not moving HEENT: able to move neck without pain, PERRL, EOMi, no injection Heart: RRR, normal S1S2 Lungs: CTAB Abdomen: +BS, nontender Musculoskeletal: nontender, even along spine, but unable to fully assess due to patient's pain upon sitting Extremities: no edema Neurologic: CN II-XII intact, strength and sensation intact throughout Skin: small red macule on left arm    ED Course  Procedures (including critical care time) Labs Review Labs Reviewed  CBC WITH DIFFERENTIAL - Abnormal; Notable for the following:    Neutrophils Relative % 42 (*)    All other components within normal limits  BASIC METABOLIC PANEL    Imaging Review No results found.   EKG Interpretation None  MDM   Final diagnoses:  None    Mr. Pinkerton is a 33 yo man who started to experience 10/10 headache 1 day after his LP in the [facility] ED. His pain is much worse upon sitting or standing and appears to be a post-LP headache. Because the radiology day team is gone at this time, outpatient blood patch was recommended by the on-call radiologist. He will be getting this blood patch tomorrow at the [facility]. We also suggested a head CT, given his history of testicular cancer, but he refused, stating that he is very regularly monitored by his oncologist, Dr. Ponciano Ort, and had a brain MRI in 2013. Also, he would like to go home, rather than try  caffeine to treat his pain. He will be sent home with pain and nausea medication and instructed to schedule his blood patch first thing tomorrow.    Drucilla Schmidt, MD 04/12/14 1911

## 2014-04-12 NOTE — Discharge Instructions (Signed)
Spinal Headache °A spinal headache is a severe headache that can happen after getting a spinal tap, also called lumbar puncture, or an epidural anesthetic. Both of these procedures involve passing a needle through ligaments that run along the back side of your spinal column and into one of the spaces just above your spinal cord. Sometimes spinal fluid leaks through the temporary hole left by the needle. This leak causes a decrease in spinal fluid pressure, which leads to a spinal headache. The headache usually begins within hours or 1-2 days after the procedure, and it lasts until adequate pressure returns as your body creates more spinal fluid. The headache can last a few days and rarely lasts for more than 1 week. °SIGNS AND SYMPTOMS  °· Severe headache pain when sitting or standing. °· Decreased headache pain when lying down. °· Neck pain, especially when flexing the neck in a chin-to-chest position. °· Vomiting. °DIAGNOSIS  °Diagnosis of spinal headache is usually made based on your recent medical history. Your health care provider will consider the timing of a recent spinal tap or epidural anesthetic, along with how soon your headache occurred afterward. On rare occasions, tests may be done to confirm the diagnosis, such as an MRI. °TREATMENT  °Treatment may include: °· Drinking extra fluids to improve your level of hydration. This will help your body replace the spinal fluid that has leaked out through the needle hole. Receiving IV fluids may be necessary. °· Taking pain medicine as prescribed by your health care provider. °· Drinking caffeinated beverages such as soda, coffee, or tea. Caffeine may help to shrink the blood vessels in your brain, which may reduce your headache pain. °· Lying flat for a few days. °· Having a blood patch procedure, which involves injecting a small amount of your blood at the puncture site to seal the leak. °HOME CARE INSTRUCTIONS °· Lie down to relieve pain if your pain gets  worse when you sit or stand. °· Drink enough fluids to keep your urine clear or pale yellow. °· Take pain medicine as directed by your health care provider. °SEEK IMMEDIATE MEDICAL CARE IF:  °· Your pain becomes very severe or cannot be controlled. °· You develop a fever. °· You have a stiff neck. °· You lose bowel or bladder control. °· You have trouble walking. °MAKE SURE YOU: °· Understand these instructions. °· Will watch your condition.   °· Will get help right away if you are not doing well or get worse. °Document Released: 02/08/2002 Document Revised: 08/24/2013 Document Reviewed: 03/11/2013 °ExitCare® Patient Information ©2015 [employer]. This information is not intended to replace advice given to you by your health care provider. Make sure you discuss any questions you have with your health care provider. ° ° °Epidural Blood Patch for Spinal Headache °An epidural blood patch is a procedure that is used to treat a headache that occurs when there is a leak of spinal fluid. This type of headache is called a spinal headache or post-dural puncture headache. Spinal headaches sometimes occur after a person undergoes a type of anesthesia called epidural anesthesia or after a lumbar puncture (also called a spinal tap).  °Generally, an epidural blood patch is done when a spinal headache has not been relieved by 2-3 days of:  °· Bed rest.   °· Drinking lots of fluids.   °· Taking oral medicines for pain, including nonsteroidal anti-inflammatory agents and caffeine. °It may also be done to treat a person who has had epidural anesthesia and is   experiencing:  °· Neck pain and stiffness that are very severe and associated with vomiting.   °· Hearing loss.   °· Double vision.   °An epidural blood patch is not done when:  °· Your headache is due to an infection in the lower back (lumbar) area or the blood.   °· You have bleeding tendencies.   °· You are taking certain blood-thinning medicines. °LET YOUR HEALTH CARE  PROVIDER KNOW ABOUT: °· Any allergies you have.   °· All medicines you are taking, including vitamins, herbs, eye drops, creams, and over-the-counter medicines.   °· Previous problems you or members of your family have had with the use of anesthetics.   °· Any blood disorders you have.   °· Previous surgeries you have had.   °· Medical conditions you have.   °RISKS AND COMPLICATIONS °Generally, an epidural blood patch is a safe procedure. However, as with any procedure, complications can occur. Possible complications include:  °· Backache. °· Nerve pain, tingling, or numbness. °· Bleeding. °· Infection. °Complications are more likely to occur in people who have bleeding disorders or infections. °BEFORE THE PROCEDURE °· Drink a lot of water the day before your procedure. °· Make sure your health care provider knows about all medicines and dietary or herbal supplements that you are taking. Take them as directed and find out if you need to stop any of them prior to the procedure. °· Follow your health care provider's instructions for when to stop eating and drinking. °· Arrange for someone to drive you to and from the procedure. °PROCEDURE  °· You will have two IV lines placed--one to give you fluids and medicines during the procedure and one to withdraw blood for the patch. °· You will lie on your stomach. °· An X-ray machine will take pictures of your back to locate the area of leakage. °· Dye may be injected so that the area can be seen well on an X-ray. °· Blood will be drawn from your arm and injected into the leaking area. When the blood is injected, you may feel tightness in your buttocks, lower back, or thighs. °AFTER THE PROCEDURE  °You will be expected to lie on your back for 2-4 hours with some pillows under your knees. It is important to lie still while on your back so that a good clot can form. You should also avoid any straining, especially right after the procedure.  °Most people obtain almost instant  relief from the spinal headache. In some, the relief comes on gradually over a 24-hour period. Some people experience mild backaches for a few days. In a few cases, people also have a mild, passing sensation of prickly or tingly skin (paresthesia), neck pain, or nerve-root pain. °Document Released: 02/08/2002 Document Revised: 06/09/2013 Document Reviewed: 03/31/2013 °ExitCare® Patient Information ©2015 [employer]. This information is not intended to replace advice given to you by your health care provider. Make sure you discuss any questions you have with your health care provider. ° °

## 2014-04-12 NOTE — ED Notes (Signed)
MD at bedside. 

## 2014-04-12 NOTE — ED Notes (Signed)
Discharge instructions reviewed with pt and his wife. Pt verbalized understanding.

## 2014-04-13 ENCOUNTER — Ambulatory Visit
Admission: RE | Admit: 2014-04-13 | Discharge: 2014-04-13 | Disposition: A | Discharge status: Home / Self Care | Payer: BC Managed Care – PPO | Source: Ambulatory Visit | Attending: Emergency Medicine | Admitting: Emergency Medicine

## 2014-04-13 ENCOUNTER — Other Ambulatory Visit: Payer: Self-pay | Admitting: Internal Medicine

## 2014-04-13 VITALS — BP 113/66 | HR 64

## 2014-04-13 DIAGNOSIS — G971 Other reaction to spinal and lumbar puncture: Secondary | ICD-10-CM

## 2014-04-13 MED ORDER — IOHEXOL 180 MG/ML  SOLN
1.0000 mL | Freq: Once | INTRAMUSCULAR | Status: AC | PRN
  Administered 2014-04-13: 1 mL via EPIDURAL

## 2014-04-13 NOTE — Discharge Instructions (Signed)

## 2014-04-13 NOTE — ED Provider Notes (Signed)
I saw and evaluated the patient, reviewed the resident's note and I agree with the findings and plan.   EKG Interpretation None      Headache - worse with standing up, better with lying down, c/w post-LP headache he had for viral meningitis. Cannot obtain blood patch tonight, will come back tmw. Neurologically intact.   Evelina Bucy, MD 04/13/14 [phone]

## 2014-04-13 NOTE — Progress Notes (Signed)
Dr. Jeralyn Ruths in to consent patient before epidural blood patch.

## 2014-04-13 NOTE — Progress Notes (Signed)
Dr. Jeralyn Ruths in to consent patient before Epidural Blood Patch.  Consents signed.  20cc blood drawn for procedure from left [facility] space without difficulty; site unremarkable.  jkl

## 2014-04-14 ENCOUNTER — Other Ambulatory Visit: Payer: BC Managed Care – PPO

## 2014-04-15 ENCOUNTER — Telehealth (HOSPITAL_BASED_OUTPATIENT_CLINIC_OR_DEPARTMENT_OTHER): Payer: Self-pay | Admitting: Emergency Medicine

## 2014-04-15 ENCOUNTER — Telehealth: Payer: Self-pay | Admitting: Radiology

## 2014-04-15 ENCOUNTER — Telehealth: Payer: Self-pay

## 2014-04-15 LAB — B. BURGDORFI ANTIBODIES, CSF

## 2014-04-15 NOTE — Telephone Encounter (Signed)
04/15/14 1450 Pt called a 2nd time today and states Dr. Marisue Humble was not in today and the office told him to see if he could get Dr. Jeralyn Ruths to order blood patch. Explained Dr.Grady could not order at blood patch for him. The office also said he could go back to the ER. Told pt he needed to be checked out as some of his symptoms have changed or gotten worse and that we could not do the blood patch till Monday as no order was available.

## 2014-04-15 NOTE — Telephone Encounter (Signed)
Patient just called after having epidural blood patch two days ago to report vomiting all day yesterday and developing a constant (not positional) "really bad" headache.  Today he reports headache with nausea only; no vomiting.  He states this headache is different from the post-LP headache, as well as the meningitis headache.  Denies fever.  I suggested he call his primary care physician (Dr. Valli Glance) and tell him what's going on, as his symptoms and recent health history were significant and unusual.  I apologized for the potential run-around, but that this was not typical of spinal headaches (especially after a blood patch).  He agreed with this and stated he would give Dr. Valli Glance a call now.  Ralene Bathe

## 2014-04-17 LAB — EHRLICHIA ANTIBODY PANEL
E chaffeensis (HGE) Ab, IgG: <1:64 {titer}
E chaffeensis (HGE) Ab, IgM: <1:20 {titer}

## 2014-04-20 ENCOUNTER — Other Ambulatory Visit: Payer: Self-pay | Admitting: Family Medicine

## 2014-04-20 ENCOUNTER — Ambulatory Visit
Admission: RE | Admit: 2014-04-20 | Discharge: 2014-04-20 | Disposition: A | Discharge status: Home / Self Care | Payer: BC Managed Care – PPO | Source: Ambulatory Visit | Attending: Family Medicine | Admitting: Family Medicine

## 2014-04-20 DIAGNOSIS — Z8547 Personal history of malignant neoplasm of testis: Secondary | ICD-10-CM

## 2014-04-20 DIAGNOSIS — G4459 Other complicated headache syndrome: Secondary | ICD-10-CM

## 2014-04-20 MED ORDER — GADOBENATE DIMEGLUMINE 529 MG/ML IV SOLN
13.0000 mL | Freq: Once | INTRAVENOUS | Status: AC | PRN
  Administered 2014-04-20: 13 mL via INTRAVENOUS

## 2014-04-21 ENCOUNTER — Other Ambulatory Visit: Payer: Self-pay | Admitting: Family Medicine

## 2014-04-21 ENCOUNTER — Ambulatory Visit
Admission: RE | Admit: 2014-04-21 | Discharge: 2014-04-21 | Disposition: A | Discharge status: Home / Self Care | Payer: BC Managed Care – PPO | Source: Ambulatory Visit | Attending: Family Medicine | Admitting: Family Medicine

## 2014-04-21 DIAGNOSIS — R519 Headache, unspecified: Secondary | ICD-10-CM

## 2014-04-21 DIAGNOSIS — R51 Headache: Principal | ICD-10-CM

## 2014-04-21 NOTE — Discharge Instructions (Signed)

## 2014-04-21 NOTE — Progress Notes (Signed)
Pt uncomfortable post bloo patch. back is hurting.

## 2014-04-21 NOTE — Progress Notes (Signed)
Blood drawn from left [facility] for blood patch. 20 cc's obtained, site unremarkable and pt tolerated well.

## 2014-04-21 NOTE — Progress Notes (Signed)
Dr. Gerilyn Pilgrim in to speak to pt re: blood patch.

## 2014-05-31 ENCOUNTER — Other Ambulatory Visit: Payer: Self-pay | Admitting: Sports Medicine

## 2014-05-31 DIAGNOSIS — M25511 Pain in right shoulder: Secondary | ICD-10-CM

## 2014-06-05 ENCOUNTER — Other Ambulatory Visit: Payer: BC Managed Care – PPO

## 2014-06-11 ENCOUNTER — Ambulatory Visit
Admission: RE | Admit: 2014-06-11 | Discharge: 2014-06-11 | Disposition: A | Discharge status: Home / Self Care | Payer: BC Managed Care – PPO | Source: Ambulatory Visit | Attending: Sports Medicine | Admitting: Sports Medicine

## 2014-06-11 DIAGNOSIS — M25511 Pain in right shoulder: Secondary | ICD-10-CM

## 2014-06-12 ENCOUNTER — Other Ambulatory Visit: Payer: BC Managed Care – PPO

## 2014-06-15 ENCOUNTER — Inpatient Hospital Stay: Admission: RE | Admit: 2014-06-15 | Payer: BC Managed Care – PPO | Source: Ambulatory Visit

## 2014-08-21 ENCOUNTER — Ambulatory Visit (HOSPITAL_COMMUNITY): Payer: BC Managed Care – PPO | Attending: Emergency Medicine

## 2014-08-21 ENCOUNTER — Encounter (HOSPITAL_COMMUNITY): Payer: Self-pay

## 2014-08-21 ENCOUNTER — Emergency Department (INDEPENDENT_AMBULATORY_CARE_PROVIDER_SITE_OTHER)
Admission: EM | Admit: 2014-08-21 | Discharge: 2014-08-21 | Disposition: A | Discharge status: Home / Self Care | Payer: BC Managed Care – PPO | Source: Home / Self Care | Attending: Family Medicine | Admitting: Family Medicine

## 2014-08-21 DIAGNOSIS — R05 Cough: Secondary | ICD-10-CM | POA: Diagnosis present

## 2014-08-21 DIAGNOSIS — J069 Acute upper respiratory infection, unspecified: Secondary | ICD-10-CM

## 2014-08-21 DIAGNOSIS — R509 Fever, unspecified: Secondary | ICD-10-CM | POA: Diagnosis not present

## 2014-08-21 DIAGNOSIS — J018 Other acute sinusitis: Secondary | ICD-10-CM

## 2014-08-21 DIAGNOSIS — R0600 Dyspnea, unspecified: Secondary | ICD-10-CM | POA: Insufficient documentation

## 2014-08-21 LAB — POCT I-STAT, CHEM 8
BUN: 17 mg/dL (ref 6–23)
Calcium, Ion: 1.22 mmol/L (ref 1.12–1.23)
Chloride: 102 mEq/L (ref 96–112)
Creatinine, Ser: 1 mg/dL (ref 0.50–1.35)
Glucose, Bld: 91 mg/dL (ref 70–99)
HCT: 49 % (ref 39.0–52.0)
Hemoglobin: 16.7 g/dL (ref 13.0–17.0)
Potassium: 4.4 mEq/L (ref 3.7–5.3)
Sodium: 138 mEq/L (ref 137–147)
TCO2: 28 mmol/L (ref 0–100)

## 2014-08-21 LAB — CBC WITH DIFFERENTIAL/PLATELET
Basophils Absolute: 0 10*3/uL (ref 0.0–0.1)
Basophils Relative: 0 % (ref 0–1)
Eosinophils Absolute: 0.2 10*3/uL (ref 0.0–0.7)
Eosinophils Relative: 2 % (ref 0–5)
HCT: 45.6 % (ref 39.0–52.0)
Hemoglobin: 15.3 g/dL (ref 13.0–17.0)
Lymphocytes Relative: 13 % (ref 12–46)
Lymphs Abs: 1.1 10*3/uL (ref 0.7–4.0)
MCH: 29.3 pg (ref 26.0–34.0)
MCHC: 33.6 g/dL (ref 30.0–36.0)
MCV: 87.2 fL (ref 78.0–100.0)
Monocytes Absolute: 0.6 10*3/uL (ref 0.1–1.0)
Monocytes Relative: 7 % (ref 3–12)
Neutro Abs: 6.2 10*3/uL (ref 1.7–7.7)
Neutrophils Relative %: 78 % — ABNORMAL HIGH (ref 43–77)
Platelets: 241 10*3/uL (ref 150–400)
RBC: 5.23 MIL/uL (ref 4.22–5.81)
RDW: 13.3 % (ref 11.5–15.5)
WBC: 8 10*3/uL (ref 4.0–10.5)

## 2014-08-21 LAB — POCT URINALYSIS DIP (DEVICE)
Bilirubin Urine: NEGATIVE
Glucose, UA: NEGATIVE mg/dL
Hgb urine dipstick: NEGATIVE
Ketones, ur: NEGATIVE mg/dL
Leukocytes, UA: NEGATIVE
Nitrite: NEGATIVE
Protein, ur: NEGATIVE mg/dL
Specific Gravity, Urine: 1.01 (ref 1.005–1.030)
Urobilinogen, UA: 0.2 mg/dL (ref 0.0–1.0)
pH: 7 (ref 5.0–8.0)

## 2014-08-21 LAB — POCT RAPID STREP A: Streptococcus, Group A Screen (Direct): NEGATIVE

## 2014-08-21 MED ORDER — AMOXICILLIN-POT CLAVULANATE 875-125 MG PO TABS: 1.0000 | ORAL_TABLET | Freq: Two times a day (BID) | ORAL | Status: DC

## 2014-08-21 MED ORDER — PSEUDOEPH-BROMPHEN-DM 30-2-10 MG/5ML PO SYRP: 10.0000 mL | ORAL_SOLUTION | Freq: Four times a day (QID) | ORAL | Status: DC | PRN

## 2014-08-21 MED ORDER — PREDNISONE 10 MG PO TABS: ORAL_TABLET | ORAL | Status: DC

## 2014-08-21 NOTE — ED Provider Notes (Signed)
CSN: [id number]     Arrival date & time 08/21/14  0867 History   First MD Initiated Contact with Patient 08/21/14 1012     Chief Complaint  Patient presents with  . Fever   (Consider location/radiation/quality/duration/timing/severity/associated sxs/prior Treatment) Patient is a 33 y.o. male presenting with fever.  Fever Associated symptoms: chills, cough, headaches, nausea and sore throat   Associated symptoms: no chest pain, no congestion, no diarrhea, no ear pain and no vomiting            33 year old male with history of viral meningitis 4 months ago, rotator cuff surgery one month ago, presents complaining of sore throat, body aches, fever, chills, nausea without vomiting. His symptoms started 2 days ago. His temperature has been up to 101F at home. He has a slight cough without chest pain or shortness of breath. He denies any neck stiffness at this time, although he does have a significant headache. No recent travel or sick contacts. Over-the-counter medications are not helping. No back pain. No rash. No hematuria or dysuria.    Past Medical History  Diagnosis Date  . Testicular cancer 2006  . Concussion     pt reports he has hx of multiple concussions, last one was 2009  . Testicular cancer 07/17/2012  . Bilateral flank pain 07/17/2012  . Migraine   . Seizure   . Hypotension   . Testicular cancer    Past Surgical History  Procedure Laterality Date  . Surgery scrotal / testicular  2006  . Orchiectomy    . Rotator cuff repair     Family History  Problem Relation Age of Onset  . Diabetes type II Father    History  Substance Use Topics  . Smoking status: Never Smoker   . Smokeless tobacco: Not on file  . Alcohol Use: 0.6 oz/week    1 Glasses of wine per week     Comment: occainsionally    Review of Systems  Constitutional: Positive for fever, chills and fatigue.  HENT: Positive for sinus pressure and sore throat. Negative for congestion and ear pain.   Eyes:  Negative for photophobia and visual disturbance.  Respiratory: Positive for cough. Negative for shortness of breath.   Cardiovascular: Negative for chest pain.  Gastrointestinal: Positive for nausea and abdominal pain. Negative for vomiting, diarrhea, constipation and blood in stool.  Neurological: Positive for headaches. Negative for speech difficulty, weakness and numbness.  All other systems reviewed and are negative.   Allergies  Contrast media; Shellfish allergy; Codeine; Doxycycline; and Erythromycin  Home Medications   Prior to Admission medications   Medication Sig Start Date End Date Taking? Authorizing Provider  ibuprofen (ADVIL,MOTRIN) 200 MG tablet Take 400 mg by mouth every 6 (six) hours as needed for moderate pain.   Yes Historical Provider, MD  amoxicillin-clavulanate (AUGMENTIN) 875-125 MG per tablet Take 1 tablet by mouth every 12 (twelve) hours. 08/21/14   Liam Graham, PA-C  aspirin 325 MG tablet Take 325 mg by mouth every 6 (six) hours as needed for headache.     Historical Provider, MD  brompheniramine-pseudoephedrine-DM 30-2-10 MG/5ML syrup Take 10 mLs by mouth 4 (four) times daily as needed. 08/21/14   Liam Graham, PA-C  doxycycline (VIBRAMYCIN) 100 MG capsule Take 100 mg by mouth 2 (two) times daily. Started Friday night 04-08-14 take for 10 days 04/08/14   Jarrett Soho Muthersbaugh, PA-C  ondansetron (ZOFRAN ODT) 4 MG disintegrating tablet Take 1 tablet (4 mg total) by mouth every 8 (eight)  hours as needed for nausea or vomiting. 04/12/14   Drucilla Schmidt, MD  oxyCODONE-acetaminophen (ROXICET) 5-325 MG per tablet Take 1-2 tablets by mouth every 6 (six) hours as needed for severe pain. 04/12/14   Drucilla Schmidt, MD  predniSONE (DELTASONE) 10 MG tablet 4 tabs PO QD for 4 days; 3 tabs PO QD for 3 days; 2 tabs PO QD for 2 days; 1 tab PO QD for 1 day 08/21/14   Liam Graham, PA-C   BP 114/73 mmHg  Pulse 76  Temp(Src) 99.2 F (37.3 C) (Oral)  Resp 16  SpO2 98% Physical  Exam  Constitutional: He is oriented to person, place, and time. He appears well-developed and well-nourished. No distress.  HENT:  Head: Normocephalic and atraumatic.  Right Ear: External ear normal.  Left Ear: External ear normal.  Nose: Right sinus exhibits maxillary sinus tenderness. Right sinus exhibits no frontal sinus tenderness. Left sinus exhibits maxillary sinus tenderness. Left sinus exhibits no frontal sinus tenderness.  Mouth/Throat: Uvula is midline, oropharynx is clear and moist and mucous membranes are normal. No oropharyngeal exudate or posterior oropharyngeal erythema.  Eyes: Conjunctivae and EOM are normal. Pupils are equal, round, and reactive to light. Right eye exhibits no discharge. Left eye exhibits no discharge.  Neck: Normal range of motion. Neck supple. No Brudzinski's sign and no Kernig's sign noted.  Cardiovascular: Normal rate, regular rhythm and normal heart sounds.  Exam reveals no gallop and no friction rub.   No murmur heard. Pulmonary/Chest: Effort normal and breath sounds normal. No respiratory distress. He has no wheezes. He has no rales.  Abdominal: Soft. He exhibits no distension and no mass. There is no hepatosplenomegaly. There is tenderness (very mild) in the right upper quadrant. There is no rigidity, no rebound, no guarding, no CVA tenderness, no tenderness at McBurney's point and negative Murphy's sign.  Musculoskeletal: Normal range of motion. He exhibits no tenderness.  Lymphadenopathy:    He has cervical adenopathy (posterior cervical on the right).  Neurological: He is alert and oriented to person, place, and time. He has normal strength. No sensory deficit. Coordination normal.  Skin: Skin is warm and dry. No rash noted. He is not diaphoretic.  Psychiatric: He has a normal mood and affect. Judgment normal.  Nursing note and vitals reviewed.   ED Course  Procedures (including critical care time) Labs Review Labs Reviewed  CBC WITH  DIFFERENTIAL - Abnormal; Notable for the following:    Neutrophils Relative % 78 (*)    All other components within normal limits  CULTURE, GROUP A STREP  POCT RAPID STREP A ([facility] CARE ONLY)  POCT I-STAT, CHEM 8  POCT URINALYSIS DIP (DEVICE)    Imaging Review Dg Chest 2 View  08/21/2014   CLINICAL DATA:  Two day history of cough and fever; difficulty breathing  EXAM: CHEST  2 VIEW  COMPARISON:  March 25, 2014  FINDINGS: Lungs are clear. Heart size and pulmonary vascularity are normal. No adenopathy. No bone lesions.  IMPRESSION: No edema or consolidation.   Electronically Signed   By: Lowella Grip M.D.   On: 08/21/2014 11:30     MDM   1. URI (upper respiratory infection)   2. Other acute sinusitis    Workup is all normal.  No nuchal rigidity or meningismus.  He has sinus tenderness so most likely sinusitis.  Treat with augmentin, prednisone, bromfed dm.  ED if worsening   Meds ordered this encounter  Medications  . amoxicillin-clavulanate (AUGMENTIN) 875-125  MG per tablet    Sig: Take 1 tablet by mouth every 12 (twelve) hours.    Dispense:  14 tablet    Refill:  0  . predniSONE (DELTASONE) 10 MG tablet    Sig: 4 tabs PO QD for 4 days; 3 tabs PO QD for 3 days; 2 tabs PO QD for 2 days; 1 tab PO QD for 1 day    Dispense:  30 tablet    Refill:  0  . brompheniramine-pseudoephedrine-DM 30-2-10 MG/5ML syrup    Sig: Take 10 mLs by mouth 4 (four) times daily as needed.    Dispense:  120 mL    Refill:  [place] Cassandre Oleksy, PA-C 08/21/14 1152

## 2014-08-21 NOTE — Discharge Instructions (Signed)

## 2014-08-21 NOTE — ED Notes (Signed)
Fever,body aches,chills since 12-19. Some relief w Motrin

## 2014-08-23 LAB — CULTURE, GROUP A STREP

## 2014-08-24 NOTE — ED Notes (Signed)
Final report of strep screening negative for A&B. No further action required

## 2014-09-15 ENCOUNTER — Ambulatory Visit
Admission: RE | Admit: 2014-09-15 | Discharge: 2014-09-15 | Disposition: A | Discharge status: Home / Self Care | Payer: BLUE CROSS/BLUE SHIELD | Source: Ambulatory Visit | Attending: Family Medicine | Admitting: Family Medicine

## 2014-09-15 ENCOUNTER — Other Ambulatory Visit: Payer: Self-pay | Admitting: Family Medicine

## 2014-09-15 DIAGNOSIS — G8929 Other chronic pain: Secondary | ICD-10-CM

## 2014-09-15 DIAGNOSIS — M533 Sacrococcygeal disorders, not elsewhere classified: Principal | ICD-10-CM

## 2014-12-09 ENCOUNTER — Emergency Department (HOSPITAL_COMMUNITY): Payer: BLUE CROSS/BLUE SHIELD

## 2014-12-09 ENCOUNTER — Encounter (HOSPITAL_COMMUNITY): Payer: Self-pay | Admitting: Family Medicine

## 2014-12-09 ENCOUNTER — Emergency Department (HOSPITAL_COMMUNITY)
Admission: EM | Admit: 2014-12-09 | Discharge: 2014-12-09 | Disposition: A | Discharge status: Home / Self Care | Payer: BLUE CROSS/BLUE SHIELD | Attending: Emergency Medicine | Admitting: Emergency Medicine

## 2014-12-09 DIAGNOSIS — Z8547 Personal history of malignant neoplasm of testis: Secondary | ICD-10-CM | POA: Diagnosis not present

## 2014-12-09 DIAGNOSIS — I480 Paroxysmal atrial fibrillation: Secondary | ICD-10-CM | POA: Diagnosis not present

## 2014-12-09 DIAGNOSIS — R002 Palpitations: Secondary | ICD-10-CM

## 2014-12-09 DIAGNOSIS — Z791 Long term (current) use of non-steroidal anti-inflammatories (NSAID): Secondary | ICD-10-CM | POA: Insufficient documentation

## 2014-12-09 DIAGNOSIS — Z87828 Personal history of other (healed) physical injury and trauma: Secondary | ICD-10-CM | POA: Insufficient documentation

## 2014-12-09 DIAGNOSIS — R079 Chest pain, unspecified: Secondary | ICD-10-CM | POA: Diagnosis present

## 2014-12-09 LAB — COMPREHENSIVE METABOLIC PANEL
ALT: 14 U/L (ref 0–53)
AST: 20 U/L (ref 0–37)
Albumin: 3.9 g/dL (ref 3.5–5.2)
Alkaline Phosphatase: 68 U/L (ref 39–117)
Anion gap: 7 (ref 5–15)
BUN: 16 mg/dL (ref 6–23)
CO2: 27 mmol/L (ref 19–32)
Calcium: 9.1 mg/dL (ref 8.4–10.5)
Chloride: 103 mmol/L (ref 96–112)
Creatinine, Ser: 1.05 mg/dL (ref 0.50–1.35)
GFR calc Af Amer: >90 mL/min (ref >90)
GFR calc non Af Amer: >90 mL/min (ref >90)
Glucose, Bld: 151 mg/dL — ABNORMAL HIGH (ref 70–99)
Potassium: 3.8 mmol/L (ref 3.5–5.1)
Sodium: 137 mmol/L (ref 135–145)
Total Bilirubin: 1 mg/dL (ref 0.3–1.2)
Total Protein: 6.2 g/dL (ref 6.0–8.3)

## 2014-12-09 LAB — CBC WITH DIFFERENTIAL/PLATELET
Basophils Absolute: 0 10*3/uL (ref 0.0–0.1)
Basophils Relative: 1 % (ref 0–1)
Eosinophils Absolute: 0.2 10*3/uL (ref 0.0–0.7)
Eosinophils Relative: 3 % (ref 0–5)
HCT: 41.7 % (ref 39.0–52.0)
Hemoglobin: 13.9 g/dL (ref 13.0–17.0)
Lymphocytes Relative: 33 % (ref 12–46)
Lymphs Abs: 2.3 10*3/uL (ref 0.7–4.0)
MCH: 29.6 pg (ref 26.0–34.0)
MCHC: 33.3 g/dL (ref 30.0–36.0)
MCV: 88.7 fL (ref 78.0–100.0)
Monocytes Absolute: 0.4 10*3/uL (ref 0.1–1.0)
Monocytes Relative: 6 % (ref 3–12)
Neutro Abs: 4.1 10*3/uL (ref 1.7–7.7)
Neutrophils Relative %: 57 % (ref 43–77)
Platelets: 229 10*3/uL (ref 150–400)
RBC: 4.7 MIL/uL (ref 4.22–5.81)
RDW: 12.7 % (ref 11.5–15.5)
WBC: 7 10*3/uL (ref 4.0–10.5)

## 2014-12-09 LAB — I-STAT TROPONIN, ED: Troponin i, poc: 0 ng/mL (ref 0.00–0.08)

## 2014-12-09 LAB — D-DIMER, QUANTITATIVE (NOT AT ARMC): D-Dimer, Quant: <0.27 ug/mL-FEU (ref 0.00–0.48)

## 2014-12-09 MED ORDER — METOPROLOL TARTRATE 25 MG PO TABS: 25.0000 mg | ORAL_TABLET | Freq: Two times a day (BID) | ORAL | Status: DC | PRN

## 2014-12-09 MED ORDER — METOPROLOL TARTRATE 1 MG/ML IV SOLN
5.0000 mg | Freq: Once | INTRAVENOUS | Status: AC
  Administered 2014-12-09: 5 mg via INTRAVENOUS
  Filled 2014-12-09: qty 5

## 2014-12-09 MED ORDER — SODIUM CHLORIDE 0.9 % IV BOLUS (SEPSIS)
1000.0000 mL | Freq: Once | INTRAVENOUS | Status: AC
  Administered 2014-12-09: 1000 mL via INTRAVENOUS

## 2014-12-09 NOTE — ED Provider Notes (Signed)
CSN: [id number]     Arrival date & time 12/09/14  1436 History   First MD Initiated Contact with Patient 12/09/14 1518     Chief Complaint  Patient presents with  . Chest Pain  . Palpitations     (Consider location/radiation/quality/duration/timing/severity/associated sxs/prior Treatment) The history is provided by the patient.  Manuel [name] is a 34 y.o. male hx of testicular cancer, who presenting with chest pain, palpitations, shortness of breath. He was recently started on meloxicam a week ago for right shoulder spasm after a rotator cuff surgery. Since yesterday he's been having intermittent palpitations that's associated with some pleuritic chest pain. Denies any rash or throat closing. Denies any history of recent travel or history of PE. Mother has history of A. Fib.    Past Medical History  Diagnosis Date  . Testicular cancer 2006  . Concussion     pt reports he has hx of multiple concussions, last one was 2009  . Testicular cancer 07/17/2012  . Bilateral flank pain 07/17/2012  . Migraine   . Seizure   . Hypotension   . Testicular cancer    Past Surgical History  Procedure Laterality Date  . Surgery scrotal / testicular  2006  . Orchiectomy    . Rotator cuff repair     Family History  Problem Relation Age of Onset  . Diabetes type II Father    History  Substance Use Topics  . Smoking status: Never Smoker   . Smokeless tobacco: Not on file  . Alcohol Use: 0.6 oz/week    1 Glasses of wine per week     Comment: occainsionally    Review of Systems  Cardiovascular: Positive for chest pain and palpitations.  All other systems reviewed and are negative.     Allergies  Contrast media; Shellfish allergy; Codeine; Doxycycline; and Erythromycin  Home Medications   Prior to Admission medications   Medication Sig Start Date End Date Taking? Authorizing Provider  meloxicam (MOBIC) 15 MG tablet Take 15 mg by mouth daily.   Yes Historical Provider, MD   amoxicillin-clavulanate (AUGMENTIN) 875-125 MG per tablet Take 1 tablet by mouth every 12 (twelve) hours. Patient not taking: Reported on 12/09/2014 08/21/14   Liam Graham, PA-C  brompheniramine-pseudoephedrine-DM 30-2-10 MG/5ML syrup Take 10 mLs by mouth 4 (four) times daily as needed. Patient not taking: Reported on 12/09/2014 08/21/14   Liam Graham, PA-C  ondansetron (ZOFRAN ODT) 4 MG disintegrating tablet Take 1 tablet (4 mg total) by mouth every 8 (eight) hours as needed for nausea or vomiting. Patient not taking: Reported on 12/09/2014 04/12/14   Karlene Einstein, MD  oxyCODONE-acetaminophen (ROXICET) 5-325 MG per tablet Take 1-2 tablets by mouth every 6 (six) hours as needed for severe pain. Patient not taking: Reported on 12/09/2014 04/12/14   Karlene Einstein, MD  predniSONE (DELTASONE) 10 MG tablet 4 tabs PO QD for 4 days; 3 tabs PO QD for 3 days; 2 tabs PO QD for 2 days; 1 tab PO QD for 1 day 08/21/14   Liam Graham, PA-C   BP 114/60 mmHg  Pulse 73  Temp(Src) 98.3 F (36.8 C) (Oral)  Resp 16  SpO2 97% Physical Exam  Constitutional: He is oriented to person, place, and time. He appears well-developed and well-nourished.  Anxious   HENT:  Head: Normocephalic.  Mouth/Throat: Oropharynx is clear and moist.  Eyes: Conjunctivae are normal. Pupils are equal, round, and reactive to light.  Neck: Normal range of  motion. Neck supple.  Cardiovascular: Normal rate, regular rhythm and normal heart sounds.   Pulmonary/Chest: Effort normal and breath sounds normal. No respiratory distress. He has no wheezes. He has no rales.  Abdominal: Soft. Bowel sounds are normal. He exhibits no distension. There is no tenderness. There is no rebound and no guarding.  Musculoskeletal: Normal range of motion. He exhibits no edema or tenderness.  Neurological: He is alert and oriented to person, place, and time. No cranial nerve deficit. Coordination normal.  Skin: Skin is warm and dry.  Psychiatric: He  has a normal mood and affect. His behavior is normal. Judgment and thought content normal.  Nursing note and vitals reviewed.   ED Course  Procedures (including critical care time) Labs Review Labs Reviewed  COMPREHENSIVE METABOLIC PANEL - Abnormal; Notable for the following:    Glucose, Bld 151 (*)    All other components within normal limits  CBC WITH DIFFERENTIAL/PLATELET  D-DIMER, QUANTITATIVE  I-STAT TROPOININ, ED    Imaging Review Dg Chest 2 View  12/09/2014   CLINICAL DATA:  Difficulty breathing with intermittent tachycardia since last night. Left upper chest tightness with numbness in right fingers.  EXAM: CHEST  2 VIEW  COMPARISON:  08/21/2014  FINDINGS: The heart size and mediastinal contours are within normal limits. Both lungs are clear. The visualized skeletal structures are unremarkable.  IMPRESSION: No active cardiopulmonary disease.   Electronically Signed   By: Marin Olp M.D.   On: 12/09/2014 17:32     EKG Interpretation   Date/Time:  Friday December 09 2014 14:44:55 EDT Ventricular Rate:  117 PR Interval:  122 QRS Duration: 90 QT Interval:  310 QTC Calculation: 432 R Axis:   84 Text Interpretation:  Sinus tachycardia Right atrial enlargement T wave  abnormality, consider inferior ischemia Abnormal ECG No significant change  since last tracing Confirmed by YAO  MD, DAVID ([id number]) on 12/09/2014 4:58:44  PM      MDM   Final diagnoses:  None   Manuel [name] is a 34 y.o. male here with palpitations, chest pain, shortness of breath. Consider PE, less likely ACS. Also consider paroxysmal afib. Will get labs, d-dimer, CXR. Will reassess.   6:03 PM D-dimer neg. HR was low 100s initially. Had paroxysmal afib on monitor occasionally but goes back to sinus. Given lopressor and HR in 70s on d/c. Low CHADVAS score so will not need anticoagulation. Will dc home with metoprolol prn.   Wandra Arthurs, MD 12/09/14 [phone]

## 2014-12-09 NOTE — ED Notes (Signed)
Pt complaining of chest pain. MD aware. Obtained EKG. MD at bedside

## 2014-12-09 NOTE — ED Notes (Signed)
Pt here for chest pain, SOB, palpitations and shaking with muscle spasm. Sts recently started on meds for shoulder injury. Possible allergic reaction.

## 2014-12-09 NOTE — Discharge Instructions (Signed)
Take metoprolol twice daily as needed for palpitations.   See cardiology for follow up.   Return to ER if you have worse palpitations, chest pain, shortness of breath.

## 2015-01-26 ENCOUNTER — Ambulatory Visit: Payer: BLUE CROSS/BLUE SHIELD | Admitting: Cardiology

## 2015-03-23 ENCOUNTER — Other Ambulatory Visit: Payer: Self-pay | Admitting: Orthopedic Surgery

## 2015-03-23 DIAGNOSIS — G8929 Other chronic pain: Secondary | ICD-10-CM

## 2015-03-23 DIAGNOSIS — M25511 Pain in right shoulder: Principal | ICD-10-CM

## 2015-04-06 ENCOUNTER — Inpatient Hospital Stay: Admission: RE | Admit: 2015-04-06 | Payer: BLUE CROSS/BLUE SHIELD | Source: Ambulatory Visit

## 2015-08-04 ENCOUNTER — Other Ambulatory Visit: Payer: Self-pay | Admitting: Family Medicine

## 2015-08-04 DIAGNOSIS — R110 Nausea: Secondary | ICD-10-CM

## 2015-08-04 DIAGNOSIS — R1084 Generalized abdominal pain: Secondary | ICD-10-CM

## 2015-08-05 ENCOUNTER — Emergency Department (HOSPITAL_COMMUNITY)
Admission: EM | Admit: 2015-08-05 | Discharge: 2015-08-05 | Disposition: A | Discharge status: Home / Self Care | Payer: BLUE CROSS/BLUE SHIELD | Attending: Emergency Medicine | Admitting: Emergency Medicine

## 2015-08-05 ENCOUNTER — Emergency Department (HOSPITAL_COMMUNITY): Payer: BLUE CROSS/BLUE SHIELD

## 2015-08-05 ENCOUNTER — Encounter (HOSPITAL_COMMUNITY): Payer: Self-pay | Admitting: *Deleted

## 2015-08-05 DIAGNOSIS — Z8547 Personal history of malignant neoplasm of testis: Secondary | ICD-10-CM | POA: Diagnosis not present

## 2015-08-05 DIAGNOSIS — Z87828 Personal history of other (healed) physical injury and trauma: Secondary | ICD-10-CM | POA: Insufficient documentation

## 2015-08-05 DIAGNOSIS — R1011 Right upper quadrant pain: Secondary | ICD-10-CM

## 2015-08-05 DIAGNOSIS — R1010 Upper abdominal pain, unspecified: Secondary | ICD-10-CM

## 2015-08-05 DIAGNOSIS — Z8679 Personal history of other diseases of the circulatory system: Secondary | ICD-10-CM | POA: Diagnosis not present

## 2015-08-05 LAB — CBC
HCT: 43.5 % (ref 39.0–52.0)
Hemoglobin: 14.6 g/dL (ref 13.0–17.0)
MCH: 29.4 pg (ref 26.0–34.0)
MCHC: 33.6 g/dL (ref 30.0–36.0)
MCV: 87.5 fL (ref 78.0–100.0)
Platelets: 257 10*3/uL (ref 150–400)
RBC: 4.97 MIL/uL (ref 4.22–5.81)
RDW: 13 % (ref 11.5–15.5)
WBC: 8.6 10*3/uL (ref 4.0–10.5)

## 2015-08-05 LAB — COMPREHENSIVE METABOLIC PANEL
ALT: 21 U/L (ref 17–63)
AST: 22 U/L (ref 15–41)
Albumin: 4.1 g/dL (ref 3.5–5.0)
Alkaline Phosphatase: 80 U/L (ref 38–126)
Anion gap: 7 (ref 5–15)
BUN: 17 mg/dL (ref 6–20)
CO2: 31 mmol/L (ref 22–32)
Calcium: 9.3 mg/dL (ref 8.9–10.3)
Chloride: 99 mmol/L — ABNORMAL LOW (ref 101–111)
Creatinine, Ser: 0.97 mg/dL (ref 0.61–1.24)
GFR calc Af Amer: >60 mL/min (ref >60)
GFR calc non Af Amer: >60 mL/min (ref >60)
Glucose, Bld: 126 mg/dL — ABNORMAL HIGH (ref 65–99)
Potassium: 3.8 mmol/L (ref 3.5–5.1)
Sodium: 137 mmol/L (ref 135–145)
Total Bilirubin: 1.2 mg/dL (ref 0.3–1.2)
Total Protein: 6.8 g/dL (ref 6.5–8.1)

## 2015-08-05 LAB — URINALYSIS, ROUTINE W REFLEX MICROSCOPIC
Bilirubin Urine: NEGATIVE
Glucose, UA: NEGATIVE mg/dL
Hgb urine dipstick: NEGATIVE
Ketones, ur: NEGATIVE mg/dL
Leukocytes, UA: NEGATIVE
Nitrite: NEGATIVE
Protein, ur: NEGATIVE mg/dL
Specific Gravity, Urine: 1.014 (ref 1.005–1.030)
pH: 7.5 (ref 5.0–8.0)

## 2015-08-05 LAB — LIPASE, BLOOD: Lipase: 38 U/L (ref 11–51)

## 2015-08-05 MED ORDER — HYDROCODONE-ACETAMINOPHEN 5-325 MG PO TABS: 2.0000 | ORAL_TABLET | ORAL | Status: DC | PRN

## 2015-08-05 MED ORDER — ONDANSETRON 4 MG PO TBDP: 4.0000 mg | ORAL_TABLET | Freq: Three times a day (TID) | ORAL | Status: DC | PRN

## 2015-08-05 MED ORDER — OMEPRAZOLE 20 MG PO CPDR: 20.0000 mg | DELAYED_RELEASE_CAPSULE | Freq: Every day | ORAL | Status: DC

## 2015-08-05 MED ORDER — FAMOTIDINE 20 MG PO TABS: 20.0000 mg | ORAL_TABLET | Freq: Two times a day (BID) | ORAL | Status: DC

## 2015-08-05 NOTE — ED Notes (Signed)
Patient transported to Ultrasound 

## 2015-08-05 NOTE — ED Notes (Signed)
Pt left with all his belongings and ambulated out of treatment area.  

## 2015-08-05 NOTE — ED Provider Notes (Signed)
CSN: [id number]     Arrival date & time 08/05/15  0216 History   First MD Initiated Contact with Patient 08/05/15 0408     Chief Complaint  Patient presents with  . Abdominal Pain     (Consider location/radiation/quality/duration/timing/severity/associated sxs/prior Treatment) HPI Comments: Patient's a 34 year old male, he has a history of abdominal discomfort for the last 2 months which has been intermittent but has become much worse over the last several hours. He reports that his family doctor has ordered blood work and an ultrasound which will not be completed for another week and a half. This evening at 1:00 he developed acute onset of epigastric and right upper quadrant discomfort with associated nausea and vomiting, he has had diarrhea for the last couple of months and reports that his stools are light colored like tan, they are loose, he does not have normal stools, there is no blood. He denies chest pain or shortness of breath but does report a pain that radiates to his right shoulder blade which is sharp and pleuritic and worse with deep breathing.  Patient is a 34 y.o. male presenting with abdominal pain. The history is provided by the patient.  Abdominal Pain   Past Medical History  Diagnosis Date  . Testicular cancer ([place]) 2006  . Concussion     pt reports he has hx of multiple concussions, last one was 2009  . Testicular cancer ([place]) 07/17/2012  . Bilateral flank pain 07/17/2012  . Migraine   . Seizure ([place])   . Hypotension   . Testicular cancer [facility])    Past Surgical History  Procedure Laterality Date  . Surgery scrotal / testicular  2006  . Orchiectomy    . Rotator cuff repair     Family History  Problem Relation Age of Onset  . Diabetes type II Father    Social History  Substance Use Topics  . Smoking status: Never Smoker   . Smokeless tobacco: None  . Alcohol Use: 0.6 oz/week    1 Glasses of wine per week     Comment: occainsionally    Review of  Systems  Gastrointestinal: Positive for abdominal pain.  All other systems reviewed and are negative.     Allergies  Contrast media; Shellfish allergy; Codeine; Doxycycline; Erythromycin; and Prednisone  Home Medications   Prior to Admission medications   Medication Sig Start Date End Date Taking? Authorizing Provider  famotidine (PEPCID) 20 MG tablet Take 1 tablet (20 mg total) by mouth 2 (two) times daily. 08/05/15   Noemi Chapel, MD  HYDROcodone-acetaminophen (NORCO/VICODIN) 5-325 MG tablet Take 2 tablets by mouth every 4 (four) hours as needed. 08/05/15   Noemi Chapel, MD  omeprazole (PRILOSEC) 20 MG capsule Take 1 capsule (20 mg total) by mouth daily. 08/05/15   Noemi Chapel, MD  ondansetron (ZOFRAN ODT) 4 MG disintegrating tablet Take 1 tablet (4 mg total) by mouth every 8 (eight) hours as needed for nausea. 08/05/15   Noemi Chapel, MD   BP 109/72 mmHg  Pulse 60  Temp(Src) 97.7 F (36.5 C) (Oral)  Resp 22  Ht 5\' 10"  (1.778 m)  Wt 152 lb (68.947 kg)  BMI 21.81 kg/m2  SpO2 96% Physical Exam  Constitutional: He appears well-developed and well-nourished. No distress.  HENT:  Head: Normocephalic and atraumatic.  Mouth/Throat: Oropharynx is clear and moist. No oropharyngeal exudate.  Eyes: Conjunctivae and EOM are normal. Pupils are equal, round, and reactive to light. Right eye exhibits no discharge. Left eye exhibits no  discharge. No scleral icterus.  Neck: Normal range of motion. Neck supple. No JVD present. No thyromegaly present.  Cardiovascular: Normal rate, regular rhythm, normal heart sounds and intact distal pulses.  Exam reveals no gallop and no friction rub.   No murmur heard. Pulmonary/Chest: Effort normal and breath sounds normal. No respiratory distress. He has no wheezes. He has no rales.  Abdominal: Soft. Bowel sounds are normal. He exhibits no distension and no mass. There is tenderness ( RUQ ttp, no guarding, no murphy's).  Musculoskeletal: Normal range of motion.  He exhibits no edema or tenderness.  Lymphadenopathy:    He has no cervical adenopathy.  Neurological: He is alert. Coordination normal.  Skin: Skin is warm and dry. No rash noted. No erythema.  Psychiatric: He has a normal mood and affect. His behavior is normal.  Nursing note and vitals reviewed.   ED Course  Procedures (including critical care time) Labs Review Labs Reviewed  COMPREHENSIVE METABOLIC PANEL - Abnormal; Notable for the following:    Chloride 99 (*)    Glucose, Bld 126 (*)    All other components within normal limits  URINALYSIS, ROUTINE W REFLEX MICROSCOPIC (NOT AT [facility]) - Abnormal; Notable for the following:    APPearance CLOUDY (*)    All other components within normal limits  LIPASE, BLOOD  CBC    Imaging Review US Abdomen Limited Ruq  08/05/2015  CLINICAL DATA:  Right upper quadrant pain EXAM: US ABDOMEN LIMITED - RIGHT UPPER QUADRANT COMPARISON:  None. FINDINGS: Gallbladder: Contracted, decreasing sensitivity for calculi. No calculi are evident. No gallbladder wall thickening or pericholecystic fluid. The patient did not appear to be tender over the gallbladder. Common bile duct: Diameter: Normal, 4.5 mm. Liver: No focal lesion identified. Within normal limits in parenchymal echogenicity. IMPRESSION: Mild gallbladder contraction, which may be physiologic if this was a nonfasting exam. Otherwise unremarkable liver, gallbladder and bile ducts. Electronically Signed   By: Andreas Newport M.D.   On: 08/05/2015 05:14   I have personally reviewed and evaluated these images and lab results as part of my medical decision-making.    MDM   Final diagnoses:  Pain of upper abdomen    Proceed with ultrasound, rule out cholecystitis. Otherwise the patient has a benign abdomen  Labs and [place] unrevealing - pt infromed - has normal VS - stable for d/c.  Meds given in ED:  Medications - No data to display  New Prescriptions   FAMOTIDINE (PEPCID) 20 MG TABLET    Take  1 tablet (20 mg total) by mouth 2 (two) times daily.   HYDROCODONE-ACETAMINOPHEN (NORCO/VICODIN) 5-325 MG TABLET    Take 2 tablets by mouth every 4 (four) hours as needed.   OMEPRAZOLE (PRILOSEC) 20 MG CAPSULE    Take 1 capsule (20 mg total) by mouth daily.   ONDANSETRON (ZOFRAN ODT) 4 MG DISINTEGRATING TABLET    Take 1 tablet (4 mg total) by mouth every 8 (eight) hours as needed for nausea.        Noemi Chapel, MD 08/05/15 [phone]

## 2015-08-05 NOTE — Discharge Instructions (Signed)

## 2015-08-05 NOTE — ED Notes (Signed)
Pt states that he has been experiencing abd pain, saw his PCP today who ordered blood work and [place]. C/o vomiting and diarrhea as well.

## 2015-08-09 ENCOUNTER — Other Ambulatory Visit (HOSPITAL_COMMUNITY): Payer: Self-pay | Admitting: Surgery

## 2015-08-09 ENCOUNTER — Other Ambulatory Visit: Payer: Self-pay | Admitting: Surgery

## 2015-08-09 DIAGNOSIS — R1013 Epigastric pain: Secondary | ICD-10-CM

## 2015-08-14 ENCOUNTER — Other Ambulatory Visit: Payer: BLUE CROSS/BLUE SHIELD

## 2015-08-14 ENCOUNTER — Other Ambulatory Visit (HOSPITAL_COMMUNITY): Payer: BLUE CROSS/BLUE SHIELD

## 2015-08-15 ENCOUNTER — Encounter
Admission: RE | Admit: 2015-08-15 | Discharge: 2015-08-15 | Disposition: A | Discharge status: Home / Self Care | Payer: BLUE CROSS/BLUE SHIELD | Source: Ambulatory Visit | Attending: Surgery | Admitting: Surgery

## 2015-08-15 DIAGNOSIS — R1013 Epigastric pain: Secondary | ICD-10-CM | POA: Diagnosis not present

## 2015-08-15 MED ORDER — SINCALIDE 5 MCG IJ SOLR
1.3800 ug/kg | Freq: Once | INTRAMUSCULAR | Status: AC
  Administered 2015-08-15: 1.38 ug via INTRAVENOUS

## 2015-08-15 MED ORDER — TECHNETIUM TC 99M MEBROFENIN IV KIT
5.0500 | PACK | Freq: Once | INTRAVENOUS | Status: AC | PRN
  Administered 2015-08-15: 5.05 via INTRAVENOUS

## 2015-08-23 ENCOUNTER — Other Ambulatory Visit: Payer: Self-pay | Admitting: Gastroenterology

## 2015-08-23 DIAGNOSIS — R112 Nausea with vomiting, unspecified: Secondary | ICD-10-CM

## 2015-08-23 DIAGNOSIS — R1084 Generalized abdominal pain: Secondary | ICD-10-CM

## 2015-09-01 ENCOUNTER — Ambulatory Visit
Admission: RE | Admit: 2015-09-01 | Discharge: 2015-09-01 | Disposition: A | Discharge status: Home / Self Care | Payer: BLUE CROSS/BLUE SHIELD | Source: Ambulatory Visit | Attending: Gastroenterology | Admitting: Gastroenterology

## 2015-09-01 DIAGNOSIS — R1084 Generalized abdominal pain: Secondary | ICD-10-CM

## 2015-09-01 DIAGNOSIS — R112 Nausea with vomiting, unspecified: Secondary | ICD-10-CM

## 2015-09-01 MED ORDER — GADOBENATE DIMEGLUMINE 529 MG/ML IV SOLN
14.0000 mL | Freq: Once | INTRAVENOUS | Status: AC | PRN
  Administered 2015-09-01: 14 mL via INTRAVENOUS

## 2015-10-05 ENCOUNTER — Ambulatory Visit (INDEPENDENT_AMBULATORY_CARE_PROVIDER_SITE_OTHER): Payer: BLUE CROSS/BLUE SHIELD | Admitting: Neurology

## 2015-10-05 ENCOUNTER — Encounter: Payer: Self-pay | Admitting: Neurology

## 2015-10-05 VITALS — BP 112/58 | HR 67 | Ht 70.0 in | Wt 145.0 lb

## 2015-10-05 DIAGNOSIS — G9600 Cerebrospinal fluid leak, unspecified: Secondary | ICD-10-CM

## 2015-10-05 DIAGNOSIS — G43009 Migraine without aura, not intractable, without status migrainosus: Secondary | ICD-10-CM | POA: Diagnosis not present

## 2015-10-05 DIAGNOSIS — G43909 Migraine, unspecified, not intractable, without status migrainosus: Secondary | ICD-10-CM | POA: Insufficient documentation

## 2015-10-05 DIAGNOSIS — R519 Headache, unspecified: Secondary | ICD-10-CM | POA: Insufficient documentation

## 2015-10-05 DIAGNOSIS — R51 Headache with orthostatic component, not elsewhere classified: Secondary | ICD-10-CM

## 2015-10-05 DIAGNOSIS — G96810 Intracranial hypotension, unspecified: Secondary | ICD-10-CM

## 2015-10-05 DIAGNOSIS — G9389 Other specified disorders of brain: Secondary | ICD-10-CM | POA: Diagnosis not present

## 2015-10-05 DIAGNOSIS — H539 Unspecified visual disturbance: Secondary | ICD-10-CM | POA: Diagnosis not present

## 2015-10-05 DIAGNOSIS — F05 Delirium due to known physiological condition: Secondary | ICD-10-CM

## 2015-10-05 DIAGNOSIS — G971 Other reaction to spinal and lumbar puncture: Secondary | ICD-10-CM

## 2015-10-05 DIAGNOSIS — R410 Disorientation, unspecified: Secondary | ICD-10-CM

## 2015-10-05 DIAGNOSIS — G8929 Other chronic pain: Secondary | ICD-10-CM

## 2015-10-05 DIAGNOSIS — R112 Nausea with vomiting, unspecified: Secondary | ICD-10-CM | POA: Insufficient documentation

## 2015-10-05 DIAGNOSIS — R110 Nausea: Secondary | ICD-10-CM | POA: Diagnosis not present

## 2015-10-05 HISTORY — DX: Disorientation, unspecified: R41.0

## 2015-10-05 HISTORY — DX: Cerebrospinal fluid leak, unspecified: G96.00

## 2015-10-05 HISTORY — DX: Unspecified visual disturbance: H53.9

## 2015-10-05 HISTORY — DX: Nausea: R11.0

## 2015-10-05 HISTORY — DX: Headache, unspecified: R51.9

## 2015-10-05 HISTORY — DX: Other reaction to spinal and lumbar puncture: G97.1

## 2015-10-05 NOTE — Progress Notes (Signed)
[facility]    Provider:  Dr Jaynee Eagles Referring Provider: Gaynelle Arabian, MD Primary Care Physician:  Simona Huh, MD  CC:  Headaches  HPI:  Manuel [name] is a very nice 35 y.o. male here as a referral from Dr. Marisue Humble for migraines. He had complicated migraines in the past. He had a headache after a LP in 2015 and had several blood patches.At that time he was diagnosed with intracranial hypotension with MRI of the brain c/w this diagnosis. Since then he has had more headaches but different. He wonders if this is a lasting effect from the intracranial hypotension. He can feel his headache coming, he gets confused which he describes as "brain fog", a very odd feeling, he can get light sensitivity, pressure pain on the front of the head, nausea and occ vomiting, light sensitivity, needs a dark room, sound sensitivity. His upper neck hurts as well and he hears crepitus in the neck when he turns like a snap to the left. No radicular symptoms or weakness in the arms. He has a lot of cervical muscle pain. He has  the prodrome of the migraine every other day which lasts hours and doesn't feel normal the rest of the day. 15-20 headache prodrome days/month with confusional prodrome and about 4x a month this is followed by actual headache. If he lays down he can feel better and the headache lasts an hour but the confusion lasts all day. His headaches have hanged since being seen less than 3 years ago and now his headaches are more positional. Also with anxiety. He is having vision changes, loss of peripheral vision and eye changes. The headache is positional, better with laying down and worse with standing. He also has back pain where the LP was placed with left radiation into buttocks with weakness in the legs.   Reviewed notes, labs and imaging from outside physicians, which showed:  CMP, CBC WNL 08/2015  MRi of the brain 04/20/2014: personally reviewed images and agree with  the following Normal appearance of the brain itself. Mild dural enhancement, typically seen in persons who have had previous lumbar puncture and/or blood patch. This is not a worrisome finding in and of itself. No other finding of intracranial hypotension.  Review of Systems: Patient complains of symptoms per HPI as well as the following symptoms: weight, fatigue,  Ringing in ears, aching muscles, joint pain, memory loss, headache, insomnia, anxiety, not enough sleep, change in appetite, disinterest in activities. Pertinent negatives per HPI. All others negative.   Social History   Social History  . Marital Status: Married    Spouse Name: Manuel [name]  . Number of Children: 2  . Years of Education: 16   Occupational History  . Not on file.   Social History Main Topics  . Smoking status: Never Smoker   . Smokeless tobacco: Not on file  . Alcohol Use: 0.6 oz/week    1 Glasses of wine per week     Comment: occainsionally  . Drug Use: No  . Sexual Activity: Not on file   Other Topics Concern  . Not on file   Social History Narrative   Lives with wife and kids   Caffeine use: none    Family History  Problem Relation Age of Onset  . Diabetes type II Father   . Migraines Mother     Past Medical History  Diagnosis Date  . Testicular cancer ([place]) 2006  . Concussion     pt reports  he has hx of multiple concussions, last one was 2009  . Testicular cancer ([place]) 07/17/2012  . Bilateral flank pain 07/17/2012  . Migraine   . Seizure ([place])   . Hypotension   . Testicular cancer [facility])     Past Surgical History  Procedure Laterality Date  . Surgery scrotal / testicular  2006  . Orchiectomy    . Rotator cuff repair      Current Outpatient Prescriptions  Medication Sig Dispense Refill  . [DISCONTINUED] metoprolol (LOPRESSOR) 25 MG tablet Take 1 tablet (25 mg total) by mouth 2 (two) times daily as needed. (Patient not taking: Reported on 08/05/2015) 20 tablet 0   No current  facility-administered medications for this visit.    Allergies as of 10/05/2015 - Review Complete 10/05/2015  Allergen Reaction Noted  . Contrast media [iodinated diagnostic agents] Anaphylaxis 04/13/2014  . Shellfish allergy Anaphylaxis 04/23/2012  . Codeine Nausea Only 07/17/2012  . Doxycycline Nausea And Vomiting 04/21/2014  . Dilaudid [hydromorphone hcl] Other (See Comments) 10/05/2015  . Nsaids Other (See Comments) 10/05/2015  . Erythromycin Hives and Itching 04/23/2012  . Prednisone Anxiety 04/04/2015    Vitals: BP 112/58 mmHg  Pulse 67  Ht 5\' 10"  (1.778 m)  Wt 145 lb (65.772 kg)  BMI 20.81 kg/m2 Last Weight:  Wt Readings from Last 1 Encounters:  10/05/15 145 lb (65.772 kg)   Last Height:   Ht Readings from Last 1 Encounters:  10/05/15 5\' 10"  (1.778 m)    Physical exam: Exam: Gen: NAD, conversant,well groomed                     CV: RRR, no MRG. No Carotid Bruits. No peripheral edema, warm, nontender Eyes: Conjunctivae clear without exudates or hemorrhage  Neuro: Detailed Neurologic Exam  Speech:    Speech is normal; fluent and spontaneous with normal comprehension.  Cognition:    The patient is oriented to person, place, and time;     recent and remote memory intact;     language fluent;     normal attention, concentration,     fund of knowledge Cranial Nerves:    The pupils are equal, round, and reactive to light. The fundi are normal and spontaneous venous pulsations are present. Visual fields are full to finger confrontation. Extraocular movements are intact. Trigeminal sensation is intact and the muscles of mastication are normal. The face is symmetric. The palate elevates in the midline. Hearing intact. Voice is normal. Shoulder shrug is normal. The tongue has normal motion without fasciculations.   Coordination:    Normal finger to nose and heel to shin. Normal rapid alternating movements.   Gait:    Heel-toe and tandem gait are normal.   Motor  Observation:    No asymmetry, no atrophy, and no involuntary movements noted. Tone:    Normal muscle tone.    Posture:    Posture is normal. normal erect    Strength:    Strength is V/V in the upper and lower limbs.      Sensation: intact to LT     Reflex Exam:  DTR's:    Deep tendon reflexes in the upper and lower extremities are normal bilaterally.   Toes:    The toes are downgoing bilaterally.   Clonus:    Clonus is absent.      Assessment/Plan:  Very nice 35 year old male with a PMHx of migraines with 3 years of positional headaches after csf leak and intracranial hypotension  confirmed with MRi of the brain. He still has headaches and low back pain where the LP was placed. Need to evaluate for chronic intracranial hypotension with MRi of the brain w/wo. Also will order MRI of the lumbar spine to eval for persistent leak and/or trauma from the LP.   Remember to drink plenty of fluid, eat healthy meals and do not skip any meals. Try to eat protein with a every meal and eat a healthy snack such as fruit or nuts in between meals. Try to keep a regular sleep-wake schedule and try to exercise daily, particularly in the form of walking, 20-30 minutes a day, if you can.   As far as your medications are concerned, I would like to suggest: None at this time  As far as diagnostic testing: MRI of the brain and lumbar spine    Sarina Ill, MD  [facility] [address] [place] [address], [place] [address]  Phone [phone] Fax [phone]

## 2015-10-05 NOTE — Patient Instructions (Addendum)
Remember to drink plenty of fluid, eat healthy meals and do not skip any meals. Try to eat protein with a every meal and eat a healthy snack such as fruit or nuts in between meals. Try to keep a regular sleep-wake schedule and try to exercise daily, particularly in the form of walking, 20-30 minutes a day, if you can.   As far as your medications are concerned, I would like to suggest: None at this time  As far as diagnostic testing: MRI of the brain and lumbar spine   Our phone number is [phone]. We also have an after hours call service for urgent matters and there is a physician on-call for urgent questions. For any emergencies you know to call 911 or go to the nearest emergency room

## 2015-10-08 ENCOUNTER — Ambulatory Visit
Admission: RE | Admit: 2015-10-08 | Discharge: 2015-10-08 | Disposition: A | Discharge status: Home / Self Care | Payer: BLUE CROSS/BLUE SHIELD | Source: Ambulatory Visit | Attending: Neurology | Admitting: Neurology

## 2015-10-08 DIAGNOSIS — R51 Headache with orthostatic component, not elsewhere classified: Secondary | ICD-10-CM

## 2015-10-08 DIAGNOSIS — R410 Disorientation, unspecified: Secondary | ICD-10-CM

## 2015-10-08 DIAGNOSIS — G9600 Cerebrospinal fluid leak, unspecified: Secondary | ICD-10-CM

## 2015-10-08 DIAGNOSIS — G9389 Other specified disorders of brain: Secondary | ICD-10-CM

## 2015-10-08 DIAGNOSIS — R110 Nausea: Secondary | ICD-10-CM

## 2015-10-08 DIAGNOSIS — G971 Other reaction to spinal and lumbar puncture: Secondary | ICD-10-CM | POA: Diagnosis not present

## 2015-10-08 DIAGNOSIS — G96810 Intracranial hypotension, unspecified: Secondary | ICD-10-CM

## 2015-10-08 DIAGNOSIS — G8929 Other chronic pain: Secondary | ICD-10-CM

## 2015-10-08 DIAGNOSIS — R519 Headache, unspecified: Secondary | ICD-10-CM

## 2015-10-08 DIAGNOSIS — H539 Unspecified visual disturbance: Secondary | ICD-10-CM

## 2015-10-08 DIAGNOSIS — G43009 Migraine without aura, not intractable, without status migrainosus: Secondary | ICD-10-CM

## 2015-10-08 DIAGNOSIS — F05 Delirium due to known physiological condition: Secondary | ICD-10-CM

## 2015-10-08 MED ORDER — GADOBENATE DIMEGLUMINE 529 MG/ML IV SOLN
13.0000 mL | Freq: Once | INTRAVENOUS | Status: AC | PRN
  Administered 2015-10-08: 13 mL via INTRAVENOUS

## 2015-10-10 ENCOUNTER — Telehealth: Payer: Self-pay | Admitting: *Deleted

## 2015-10-10 NOTE — Telephone Encounter (Signed)
Called and spoke to pt about MRI results per Dr Jaynee Eagles note. He verbalized understanding. He wanted to make f/u appt to discuss MRI results in detail w/ Dr Jaynee Eagles. Made appt 10/16/15 at 2:30, 2:15pm check in.

## 2015-10-10 NOTE — Telephone Encounter (Signed)
-----   Message from Melvenia Beam, MD sent at 10/10/2015  3:07 PM EST ----- MRi of his lumbar spine did not show any leaks. The MRi of his brain was normal and any changes after the last LP have resolved. thanks

## 2015-10-16 ENCOUNTER — Encounter: Payer: Self-pay | Admitting: Neurology

## 2015-10-16 ENCOUNTER — Ambulatory Visit (INDEPENDENT_AMBULATORY_CARE_PROVIDER_SITE_OTHER): Payer: BLUE CROSS/BLUE SHIELD | Admitting: Neurology

## 2015-10-16 VITALS — BP 116/65 | HR 74 | Ht 70.0 in | Wt 143.0 lb

## 2015-10-16 DIAGNOSIS — R5382 Chronic fatigue, unspecified: Secondary | ICD-10-CM

## 2015-10-16 DIAGNOSIS — G43009 Migraine without aura, not intractable, without status migrainosus: Secondary | ICD-10-CM | POA: Diagnosis not present

## 2015-10-16 NOTE — Patient Instructions (Signed)
Remember to drink plenty of fluid, eat healthy meals and do not skip any meals. Try to eat protein with a every meal and eat a healthy snack such as fruit or nuts in between meals. Try to keep a regular sleep-wake schedule and try to exercise daily, particularly in the form of walking, 20-30 minutes a day, if you can.   As far as your medications are concerned, I would like to suggest: Suggest Effexor or Depakote for migraine prevention  I would like to see you back as needed, sooner if we need to. Please call us with any interim questions, concerns, problems, updates or refill requests.   Our phone number is [phone]. We also have an after hours call service for urgent matters and there is a physician on-call for urgent questions. For any emergencies you know to call 911 or go to the nearest emergency room

## 2015-10-16 NOTE — Progress Notes (Signed)
[facility]    Provider:  Dr Jaynee Eagles Referring Provider: Gaynelle Arabian, MD Primary Care Physician:  Simona Huh, MD   CC: Migraine  Interval history 10/16/2015:  Patient returns to review MRI of the brain and lumbar spine. Reviewed images and findings as below.  Has gained weight in the past, fatigue, malaise. Gained 10 pounds then lost it. No new stress or mood problems. Not sleeping well, has a 37 year old and a 67 month old. Discussed his fatigue likely due to sleep deprivation due to job, family and new baby. But will check thyroid and a hgba1c. Cbc was checked at pcp.   Brain w/wo contrast:: This is a normal MRI of the brain with and without contrast. There are no acute findings. When compared to the MRI dated 04/20/2014, the mild dural enhancement noted on the prior MRI is no longer present.  MRI lumbar spine w/wo: This is MRI of the lumbar spine with and without contrast shows mild disc bulging at L5-S1 that does not lead to any nerve root impingement. The study is otherwise normal.   HPI: Manuel [name] is a very nice 35 y.o. male here as a referral from Dr. Marisue Humble for migraines. He had complicated migraines in the past. He had a headache after a LP in 2015 and had several blood patches.At that time he was diagnosed with intracranial hypotension with MRI of the brain c/w this diagnosis. Since then he has had more headaches but different. He wonders if this is a lasting effect from the intracranial hypotension. He can feel his headache coming, he gets confused which he describes as "brain fog", a very odd feeling, he can get light sensitivity, pressure pain on the front of the head, nausea and occ vomiting, light sensitivity, needs a dark room, sound sensitivity. His upper neck hurts as well and he hears crepitus in the neck when he turns like a snap to the left. No radicular symptoms or weakness in the arms. He has a lot of cervical muscle pain. He has the  prodrome of the migraine every other day which lasts hours and doesn't feel normal the rest of the day. 15-20 headache prodrome days/month with confusional prodrome and about 4x a month this is followed by actual headache. If he lays down he can feel better and the headache lasts an hour but the confusion lasts all day. His headaches have hanged since being seen less than 3 years ago and now his headaches are more positional. Also with anxiety. He is having vision changes, loss of peripheral vision and eye changes. The headache is positional, better with laying down and worse with standing. He also has back pain where the LP was placed with left radiation into buttocks with weakness in the legs.   Reviewed notes, labs and imaging from outside physicians, which showed:  CMP, CBC WNL 08/2015  MRi of the brain 04/20/2014: personally reviewed images and agree with the following Normal appearance of the brain itself. Mild dural enhancement, typically seen in persons who have had previous lumbar puncture and/or blood patch. This is not a worrisome finding in and of itself. No other finding of intracranial hypotension.  Review of Systems: Patient complains of symptoms per HPI as well as the following symptoms: no CP, no SOB. Pertinent negatives per HPI. All others negative.   Social History   Social History  . Marital Status: Married    Spouse Name: [name]  . Number of Children: 2  .  Years of Education: 16   Occupational History  . Not on file.   Social History Main Topics  . Smoking status: Never Smoker   . Smokeless tobacco: Not on file  . Alcohol Use: 0.6 oz/week    1 Glasses of wine per week     Comment: occainsionally  . Drug Use: No  . Sexual Activity: Not on file   Other Topics Concern  . Not on file   Social History Narrative   Lives with wife and kids   Caffeine use: none    Family History  Problem Relation Age of Onset  . Diabetes type II Father   . Migraines Mother       Past Medical History  Diagnosis Date  . Testicular cancer ([place]) 2006  . Concussion     pt reports he has hx of multiple concussions, last one was 2009  . Testicular cancer ([place]) 07/17/2012  . Bilateral flank pain 07/17/2012  . Migraine   . Seizure ([place])   . Hypotension   . Testicular cancer [facility])     Past Surgical History  Procedure Laterality Date  . Surgery scrotal / testicular  2006  . Orchiectomy    . Rotator cuff repair      Current Outpatient Prescriptions  Medication Sig Dispense Refill  . [DISCONTINUED] metoprolol (LOPRESSOR) 25 MG tablet Take 1 tablet (25 mg total) by mouth 2 (two) times daily as needed. (Patient not taking: Reported on 08/05/2015) 20 tablet 0   No current facility-administered medications for this visit.    Allergies as of 10/16/2015 - Review Complete 10/16/2015  Allergen Reaction Noted  . Contrast media [iodinated diagnostic agents] Anaphylaxis 04/13/2014  . Iodine Anaphylaxis 04/23/2012  . Shellfish allergy Anaphylaxis 04/23/2012  . Codeine Nausea Only 07/17/2012  . Doxycycline Nausea And Vomiting 04/21/2014  . Dilaudid [hydromorphone hcl] Other (See Comments) 10/05/2015  . Nsaids Other (See Comments) 10/05/2015  . Erythromycin Hives and Itching 04/23/2012  . Prednisone Anxiety 04/04/2015    Vitals: BP 116/65 mmHg  Pulse 74  Ht 5\' 10"  (1.778 m)  Wt 143 lb (64.864 kg)  BMI 20.52 kg/m2 Last Weight:  Wt Readings from Last 1 Encounters:  10/16/15 143 lb (64.864 kg)   Last Height:   Ht Readings from Last 1 Encounters:  10/16/15 5\' 10"  (1.778 m)    Physical exam: Exam: Gen: NAD, conversant, well nourised, well groomed                     CV: RRR, no MRG. No Carotid Bruits. No peripheral edema, warm, nontender Eyes: Conjunctivae clear without exudates or hemorrhage  Neuro: Detailed Neurologic Exam  Speech:    Speech is normal; fluent and spontaneous with normal comprehension.  Cognition:    The patient is oriented to  person, place, and time;     recent and remote memory intact;     language fluent;     normal attention, concentration,     fund of knowledge Cranial Nerves:    The pupils are equal, round, and reactive to light. The fundi are normal and spontaneous venous pulsations are present. Visual fields are full to finger confrontation. Extraocular movements are intact. Trigeminal sensation is intact and the muscles of mastication are normal. The face is symmetric. The palate elevates in the midline. Hearing intact. Voice is normal. Shoulder shrug is normal. The tongue has normal motion without fasciculations.   Coordination:    Normal finger to nose and heel to shin.  Normal rapid alternating movements.   Gait:    Heel-toe and tandem gait are normal.   Motor Observation:    No asymmetry, no atrophy, and no involuntary movements noted. Tone:    Normal muscle tone.    Posture:    Posture is normal. normal erect    Strength:    Strength is V/V in the upper and lower limbs.      Sensation: intact to LT     Reflex Exam:  DTR's:    Deep tendon reflexes in the upper and lower extremities are normal bilaterally.   Toes:    The toes are downgoing bilaterally.   Clonus:    Clonus is absent.    Assessment/Plan: Very nice 35 year old male with a PMHx of migraines with 3 years of positional headaches after csf leak and intracranial hypotension confirmed with MRi of the brain. He still has headaches and low back pain where the LP was placed. Need to evaluate for chronic intracranial hypotension with MRi of the brain w/wo. Also will order MRI of the lumbar spine to eval for persistent leak and/or trauma from the LP.  Both were unremarkable and no evidence of ongoing pathology.  Remember to drink plenty of fluid, eat healthy meals and do not skip any meals. Try to eat protein with a every meal and eat a healthy snack such as fruit or nuts in between meals. Try to keep a regular sleep-wake schedule and  try to exercise daily, particularly in the form of walking, 20-30 minutes a day, if you can.   Migraine: As far as your medications are concerned, I would like to suggest: None at this time. In the future Venlafaxine or Depakote may help with migraine and not exacerbate anxiety however may cause additional fatigue  Fatigue: iscussed his fatigue likely due to sleep deprivation due to job, family and new baby. But will check thyroid and a hgba1c. Cbc was checked at pcp. Encouraged f/u with pcp.  To prevent or relieve headaches, try the following: Cool Compress. Lie down and place a cool compress on your head.  Avoid headache triggers. If certain foods or odors seem to have triggered your migraines in the past, avoid them. A headache diary might help you identify triggers.  Include physical activity in your daily routine. Try a daily walk or other moderate aerobic exercise.  Manage stress. Find healthy ways to cope with the stressors, such as delegating tasks on your to-do list.  Practice relaxation techniques. Try deep breathing, yoga, massage and visualization.  Eat regularly. Eating regularly scheduled meals and maintaining a healthy diet might help prevent headaches. Also, drink plenty of fluids.  Follow a regular sleep schedule. Sleep deprivation might contribute to headaches Consider biofeedback. With this mind-body technique, you learn to control certain bodily functions - such as muscle tension, heart rate and blood pressure - to prevent headaches or reduce headache pain.    Proceed to emergency room if you experience new or worsening symptoms or symptoms do not resolve, if you have new neurologic symptoms or if headache is severe, or for any concerning symptom.   Sarina Ill, MD  [facility] [address] [place], [place] [address]  Phone [phone] Fax [phone]  A total of 30 minutes was spent face-to-face with this patient. Over half this  time was spent on counseling patient on the migraine, fatigue diagnosis and different diagnostic and therapeutic options available.

## 2015-10-17 ENCOUNTER — Telehealth: Payer: Self-pay | Admitting: *Deleted

## 2015-10-17 LAB — BASIC METABOLIC PANEL
BUN/Creatinine Ratio: 19 (ref 8–19)
BUN: 20 mg/dL (ref 6–20)
CO2: 26 mmol/L (ref 18–29)
Calcium: 9.7 mg/dL (ref 8.7–10.2)
Chloride: 99 mmol/L (ref 96–106)
Creatinine, Ser: 1.06 mg/dL (ref 0.76–1.27)
GFR calc Af Amer: 104 mL/min/{1.73_m2} (ref >59)
GFR calc non Af Amer: 90 mL/min/{1.73_m2} (ref >59)
Glucose: 91 mg/dL (ref 65–99)
Potassium: 4.6 mmol/L (ref 3.5–5.2)
Sodium: 140 mmol/L (ref 134–144)

## 2015-10-17 LAB — THYROID PANEL WITH TSH
Free Thyroxine Index: 2.1 (ref 1.2–4.9)
T3 Uptake Ratio: 27 % (ref 24–39)
T4, Total: 7.8 ug/dL (ref 4.5–12.0)
TSH: 1.24 u[IU]/mL (ref 0.450–4.500)

## 2015-10-17 LAB — HEMOGLOBIN A1C
Est. average glucose Bld gHb Est-mCnc: 114 mg/dL
Hgb A1c MFr Bld: 5.6 % (ref 4.8–5.6)

## 2015-10-17 NOTE — Telephone Encounter (Signed)
Left detailed message that labs normal per Dr Jaynee Eagles. Gave [facility] phone number if he has further questions.

## 2015-10-17 NOTE — Telephone Encounter (Signed)
-----   Message from Melvenia Beam, MD sent at 10/17/2015 12:53 PM EST ----- Labs normal thanks

## 2015-10-25 ENCOUNTER — Encounter: Payer: Self-pay | Admitting: Neurology

## 2015-12-08 DIAGNOSIS — Z713 Dietary counseling and surveillance: Secondary | ICD-10-CM | POA: Diagnosis not present

## 2015-12-12 ENCOUNTER — Encounter: Payer: Self-pay | Admitting: Internal Medicine

## 2015-12-12 ENCOUNTER — Ambulatory Visit (INDEPENDENT_AMBULATORY_CARE_PROVIDER_SITE_OTHER): Payer: BLUE CROSS/BLUE SHIELD | Admitting: Internal Medicine

## 2015-12-12 VITALS — BP 112/68 | HR 70 | Ht 70.0 in | Wt 138.2 lb

## 2015-12-12 DIAGNOSIS — R000 Tachycardia, unspecified: Secondary | ICD-10-CM | POA: Diagnosis not present

## 2015-12-12 NOTE — Progress Notes (Signed)
ELECTROPHYSIOLOGY CONSULT NOTE  Patient ID: Manuel [name], MRN: [id number], DOB/AGE: 11-28-1980 35 y.o. Admit date: (Not on file) Date of Consult: 12/12/2015  Primary Physician: Simona Huh, MD Primary Cardiologist: [facility]  Chief Complaint:  spells   HPI Manuel [name] is a 35 y.o. male referred with 3 month hx of debilitating lightheadedness.  He has been extremely fit but has noted problems with post exercise LH as well as orthostatic intolerance, shower and heat intolerance. He has had some tachypalpitations; no violacious discoloration of his feet.  He is volume and salt replete  His has hx of texticular cancer for which he underwent radiation of his sternum. This was about 10 yrs ago     Past Medical History  Diagnosis Date  . Testicular cancer ([place]) 2006  . Concussion     pt reports he has hx of multiple concussions, last one was 2009  . Testicular cancer ([place]) 07/17/2012  . Bilateral flank pain 07/17/2012  . Migraine   . Seizure ([place])   . Hypotension   . Testicular cancer [facility])       Surgical History:  Past Surgical History  Procedure Laterality Date  . Surgery scrotal / testicular  2006  . Orchiectomy    . Rotator cuff repair       Home Meds: Prior to Admission medications   Not on File    Allergies:  Allergies  Allergen Reactions  . Contrast Media [Iodinated Diagnostic Agents] Anaphylaxis  . Iodine Anaphylaxis    Other reaction(s): anaphylaxis  . Shellfish Allergy Anaphylaxis  . Codeine Nausea Only    GI upset  . Doxycycline Nausea And Vomiting  . Dilaudid [Hydromorphone Hcl] Other (See Comments)    Nausea  . Nsaids Other (See Comments)  . Erythromycin Hives and Itching  . Prednisone Anxiety    Stomach upset    Social History   Social History  . Marital Status: Married    Spouse Name: [name]  . Number of Children: 2  . Years of Education: 16   Occupational History  . Not on file.    Social History Main Topics  . Smoking status: Never Smoker   . Smokeless tobacco: Not on file  . Alcohol Use: 0.6 oz/week    1 Glasses of wine per week     Comment: occainsionally  . Drug Use: No  . Sexual Activity: Not on file   Other Topics Concern  . Not on file   Social History Narrative   Lives with wife and kids   Caffeine use: none     Family History  Problem Relation Age of Onset  . Diabetes type II Father   . Migraines Mother      ROS:  Please see the history of present illness.     All other systems reviewed and negative.    Physical Exam:*   Blood pressure 112/68, pulse 70, height 5\' 10"  (1.778 m), weight 138 lb 3.2 oz (62.687 kg), SpO2 98 %. General: Well developed, well nourished male in no acute distress. Head: Normocephalic, atraumatic, sclera non-icteric, no xanthomas, nares are without discharge. EENT: normal  Lymph Nodes:  none Neck: Negative for carotid bruits. JVD not elevated. Back:without scoliosis kyphosis*  Lungs: Clear bilaterally to auscultation without wheezes, rales, or rhonchi. Breathing is unlabored. Heart: RRR with S1 S2. No  murmur . No rubs, or gallops appreciated. Abdomen: Soft, non-tender, non-distended with normoactive bowel sounds. No hepatomegaly. No rebound/guarding.  No obvious abdominal masses. Msk:  Strength and tone appear normal for age. Extremities: No clubbing or cyanosis. No*  edema.  Distal pedal pulses are 2+ and equal bilaterally. Skin: Warm and Dry Neuro: Alert and oriented X 3. CN [name]-XII intact Grossly normal sensory and motor function . Psych:  Responds to questions appropriately with a normal affect.      Labs: Cardiac Enzymes No results for input(s): CKTOTAL, CKMB, TROPONINI in the last 72 hours. CBC Lab Results  Component Value Date   WBC 8.6 08/05/2015   HGB 14.6 08/05/2015   HCT 43.5 08/05/2015   MCV 87.5 08/05/2015   PLT 257 08/05/2015   PROTIME: No results for input(s): LABPROT, INR in the last  72 hours. Chemistry No results for input(s): NA, K, CL, CO2, BUN, CREATININE, CALCIUM, PROT, BILITOT, ALKPHOS, ALT, AST, GLUCOSE in the last 168 hours.  Invalid input(s): LABALBU Lipids Lab Results  Component Value Date   CHOL 196 04/24/2012   HDL 64 04/24/2012   LDLCALC 121* 04/24/2012   TRIG 55 04/24/2012   BNP No results found for: PROBNP Thyroid Function Tests: No results for input(s): TSH, T4TOTAL, T3FREE, THYROIDAB in the last 72 hours.  Invalid input(s): FREET3 Miscellaneous Lab Results  Component Value Date   DDIMER <0.27 12/09/2014    Radiology/Studies:  No results found.  EKG: * sinus normal intervals  Assessment and Plan:  POTS  Testicular Cancer hx XRT    the patient has symptoms consistent with a dysautonomic process with orthostatic intolerance heat intoleranceShower intolerance and post exercise intolerance. This is true not withstanding the orthostatic vital signs today which notably were undertaken by a new CMA after only 2 minutes of recumbency.  We reviewed the physiology of autonomic dysfunction. I've encouraged salt water repletion. I've encouraged him to resume exercise albeit with care and possibly recumbent exercise.  I wonder whether his exposure to radiation therapy of his sternum had any impact on autonomic function. 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

## 2015-12-12 NOTE — Patient Instructions (Signed)
Medication Instructions: - no changes  Labwork: - none  Procedures/Testing: - none  Follow-Up: - Your physician recommends that you schedule a follow-up appointment in: 8-10 weeks with Dr. Caryl Comes.  Any Additional Special Instructions Will Be Listed Below (If Applicable).     If you need a refill on your cardiac medications before your next appointment, please call your pharmacy.

## 2015-12-22 ENCOUNTER — Telehealth: Payer: Self-pay | Admitting: Internal Medicine

## 2015-12-22 NOTE — Telephone Encounter (Signed)
New message     Calling to follow up with Dr Caryl Comes about his condition and to see if Dr Caryl Comes did any research.  Please call and give an update

## 2015-12-22 NOTE — Telephone Encounter (Signed)
PT AWARE  WILL FORWARD  TO  DR  Caryl Comes  FOR REMINDER ON RESEARCHING  DX. ALSO PT  WANTS  TO K NOW  IF  MAY TAKE  INDERAL AS  NEEDED PT  ALREADY HAS SCRIPT .Adonis Housekeeper

## 2015-12-25 NOTE — Telephone Encounter (Signed)
PT  IS WANTING  TO KNOW  IF  HE NEEDS TO  TAKE  INDERAL  DAILY AS  PREVIOUSLY INSTRUCTED .Adonis Housekeeper

## 2015-12-25 NOTE — Telephone Encounter (Signed)
Have found a few reports of radiation assoc with subsequent development of autonomic insuficiency after some years.  The infrequency of the reports makes the causal relationship as opposed to a coincidental relationship less clear

## 2015-12-26 NOTE — Telephone Encounter (Signed)
If it was working take it

## 2015-12-29 NOTE — Telephone Encounter (Signed)
I spoke with the patient. He is aware of what Dr. 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 He is also aware if the inderal is helping him, he should take this. He was prescribed this by another cardiologist- Inderal 20 mg BID. I have advised him if he is having symptoms now, then he can take this if needed.  I explained he could start out taking Inderal 20 mg 1/2 tablet BID, then increase if needed and as tolerated. I also advised if his symptoms settle down, then he can take the inderal on an as needed basis. I have encouraged him to call back with any questions or concerns. He is agreeable and voices understanding.

## 2016-01-01 ENCOUNTER — Encounter: Payer: Self-pay | Admitting: Internal Medicine

## 2016-02-12 ENCOUNTER — Encounter: Payer: Self-pay | Admitting: *Deleted

## 2016-02-12 ENCOUNTER — Encounter: Payer: Self-pay | Admitting: Internal Medicine

## 2016-02-12 ENCOUNTER — Ambulatory Visit (INDEPENDENT_AMBULATORY_CARE_PROVIDER_SITE_OTHER): Payer: BLUE CROSS/BLUE SHIELD | Admitting: Internal Medicine

## 2016-02-12 VITALS — BP 124/76 | HR 80 | Ht 70.5 in | Wt 146.0 lb

## 2016-02-12 DIAGNOSIS — I951 Orthostatic hypotension: Secondary | ICD-10-CM | POA: Diagnosis not present

## 2016-02-12 DIAGNOSIS — G90A Postural orthostatic tachycardia syndrome (POTS): Secondary | ICD-10-CM

## 2016-02-12 DIAGNOSIS — R000 Tachycardia, unspecified: Secondary | ICD-10-CM

## 2016-02-12 DIAGNOSIS — I498 Other specified cardiac arrhythmias: Secondary | ICD-10-CM

## 2016-02-12 NOTE — Progress Notes (Signed)
A year     Patient Care Team: Gaynelle Arabian, MD as PCP - General (Family Medicine)   HPI  Manuel [name] is a 35 y.o. 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 He is much improved with increased sodium intake increased fluid intake although his urine is still yellow. He struggles with exercise especially pulse exercise like playing tennis. He has noted that he is a problem and is doing much better with cool showers  Records and Results Reviewed XX  Past Medical History  Diagnosis Date  . Testicular cancer ([place]) 2006  . Concussion     pt reports he has hx of multiple concussions, last one was 2009  . Testicular cancer ([place]) 07/17/2012  . Bilateral flank pain 07/17/2012  . Migraine   . Seizure ([place])   . Hypotension   . Testicular cancer [facility])     Past Surgical History  Procedure Laterality Date  . Surgery scrotal / testicular  2006  . Orchiectomy    . Rotator cuff repair      Current Outpatient Prescriptions  Medication Sig Dispense Refill  . propranolol (INDERAL) 20 MG tablet Take 20 mg by mouth 2 (two) times daily as needed (tachycardia).     . [DISCONTINUED] metoprolol (LOPRESSOR) 25 MG tablet Take 1 tablet (25 mg total) by mouth 2 (two) times daily as needed. (Patient not taking: Reported on 08/05/2015) 20 tablet 0   No current facility-administered medications for this visit.    Allergies  Allergen Reactions  . Contrast Media [Iodinated Diagnostic Agents] Anaphylaxis  . Iodine Anaphylaxis    Other reaction(s): anaphylaxis  . Shellfish Allergy Anaphylaxis  . Codeine Nausea Only    GI upset  . Doxycycline Nausea And Vomiting  . Dilaudid [Hydromorphone Hcl] Other (See Comments)    Nausea  . Nsaids Other (See Comments)  . Erythromycin Hives and Itching  . Prednisone Anxiety    Stomach upset      Review of Systems negative except from HPI and PMH  Physical  Exam BP 124/76 mmHg  Pulse 80  Ht 5' 10.5" (1.791 m)  Wt 146 lb (66.225 kg)  BMI 20.65 kg/m2 Well developed and well nourished in no acute distress HENT normal E scleral and icterus clear Neck Supple JVP flat; carotids brisk and full Clear to ausculation  Regular rate and rhythm, no murmurs gallops or rub Soft with active bowel sounds No clubbing cyanosis  Edema Alert and oriented, grossly normal motor and sensory function Skin Warm and Dry    Assessment and  Plan POTS  Testicular Cancer hx XRT  He is much improved. I've encouraged him to increase his fluid intake. I suggested recumbent exercise as opposed to vertical exercise and sustained exercise as opposed to pulse exercise, i.e. tennis.   We spent more than 50% of our >25 min visit in face to face counseling regarding the above

## 2016-02-12 NOTE — Patient Instructions (Signed)
Medication Instructions: - Your physician recommends that you continue on your current medications as directed. Please refer to the Current Medication list given to you today.  Labwork: - none  Procedures/Testing: - none  Follow-Up: - Your physician wants you to follow-up in: 6 months with Dr. Caryl Comes. You will receive a reminder letter in the mail two months in advance. If you don't receive a letter, please call our office to schedule the follow-up appointment.  Any Additional Special Instructions Will Be Listed Below (If Applicable). - Increase your fluid intake.    If you need a refill on your cardiac medications before your next appointment, please call your pharmacy.

## 2016-04-28 IMAGING — MR MR [PERSON_NAME] PELVIS W/CM
10 of 16 series · 31 of 48 positions shown · IV contrast (multihance)
Comparison: 07/21/2008

CLINICAL DATA: Nausea, vomiting, diarrhea, and belching over the
past 3 months. Remote history of testicular cancer.

EXAM:
MR ABDOMEN AND PELVIS WITHOUT AND WITH CONTRAST (MR ENTEROGRAPHY)
TECHNIQUE: Multiplanar, multisequence MRI of the abdomen and pelvis was
performed both before and during bolus administration of intravenous
contrast. Negative oral contrast VoLumen was given.
CONTRAST:  14mL MULTIHANCE GADOBENATE DIMEGLUMINE 529 MG/ML IV SOLN

[Series 5: cor haste · coronal · 5.0mm · 0.70mm/px · 2 of 29 slices shown]
[im 1/29]
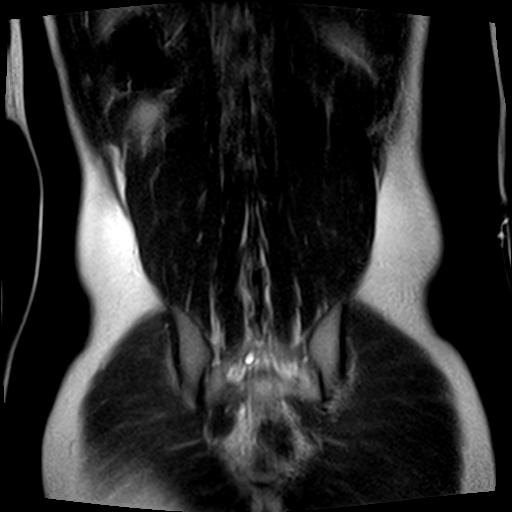
[im 29/29]
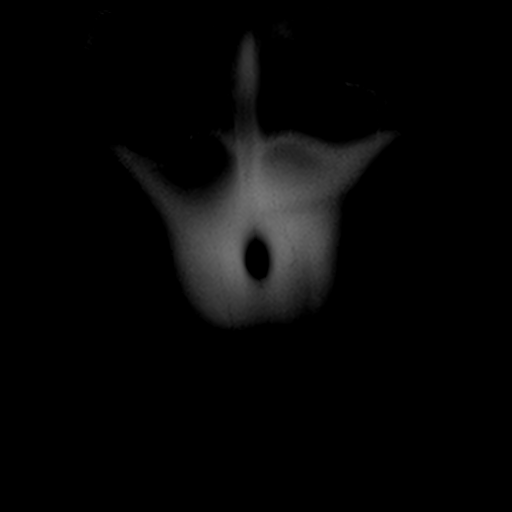

[Series 6: axial haste · axial · 6.0mm · 0.66mm/px · z∈[-215,+101]mm · 3 of 45 slices shown]
[im 1/45]
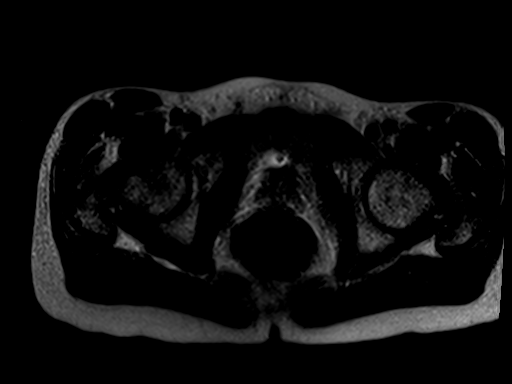
[im 23/45]
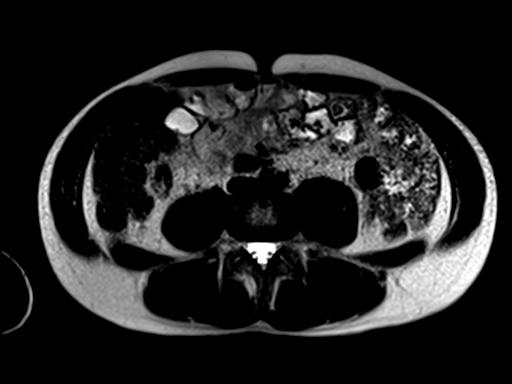
[im 45/45]
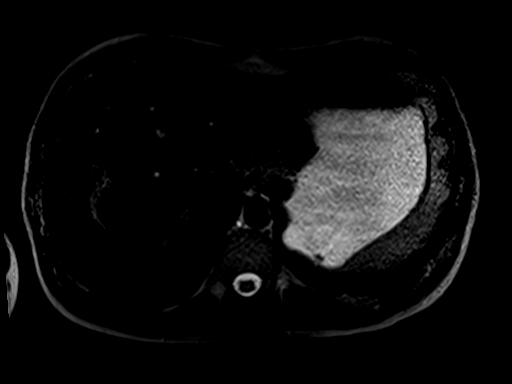

[Series 7: ep2d_diff_b50_500_[id number]_bh · axial · 6.0mm · 1.77mm/px · z∈[-215,+101]mm · 7 of 135 slices shown]
[im 1/135]
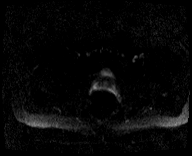
[im 23/135]
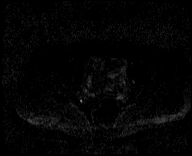
[im 45/135]
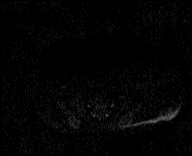
[im 68/135]
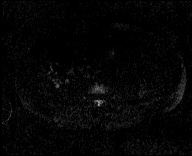
[im 90/135]
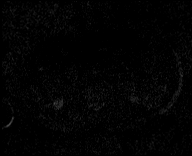
[im 112/135]
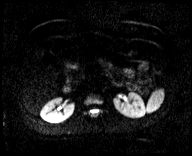
[im 135/135]
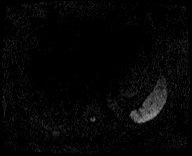

[Series 8: [id number]_bh_adc · axial · 6.0mm · 1.77mm/px · z∈[-215,+101]mm · 3 of 45 slices shown]
[im 1/45]
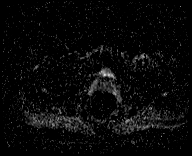
[im 23/45]
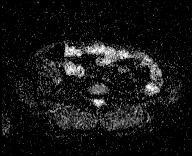
[im 45/45]
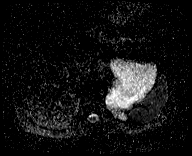

[Series 9: bSSFP · coronal · 5.0mm · 0.70mm/px · 1 of 29 slices shown]
[im 1/29]
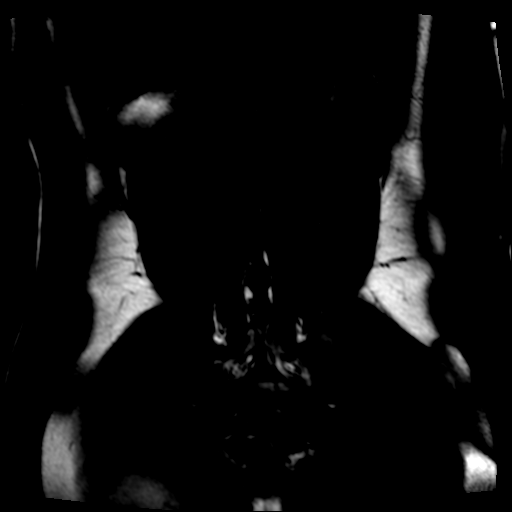

[Series 10: T1 dynamic · axial · non-contrast · 4.0mm · 0.66mm/px · z∈[-228,+87]mm · 3 of 80 slices shown]
[im 1/80]
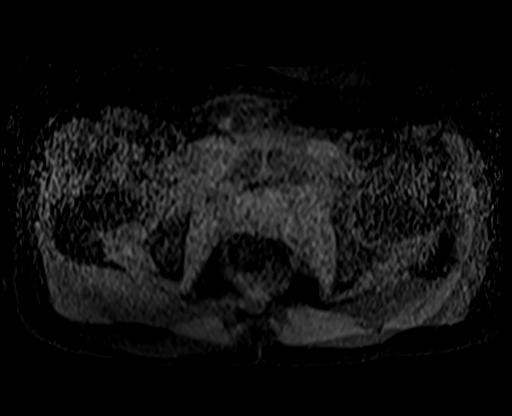
[im 40/80]
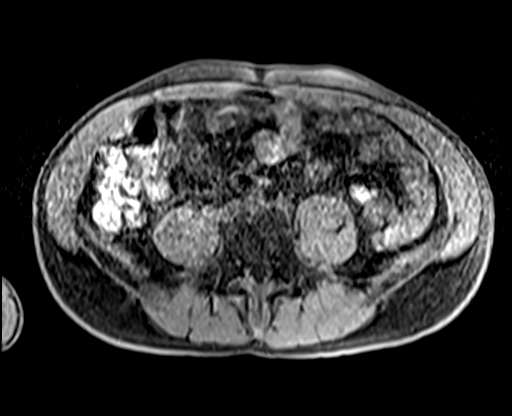
[im 80/80]
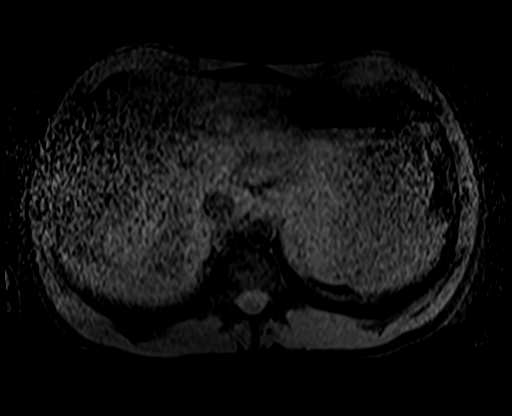

[Series 11: T1 dynamic post-contrast · axial · 4.0mm · 0.66mm/px · z∈[-228,+87]mm · 3 of 80 slices shown (1 of 4)]
[im 1/80]
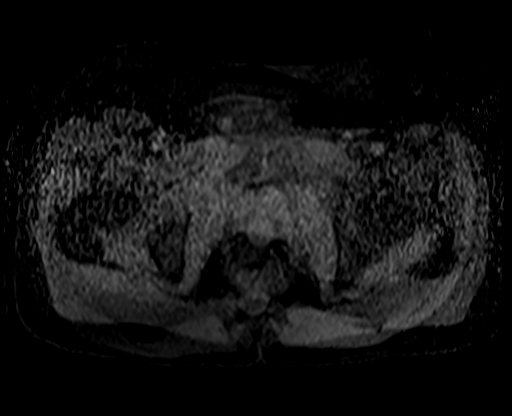
[im 40/80]
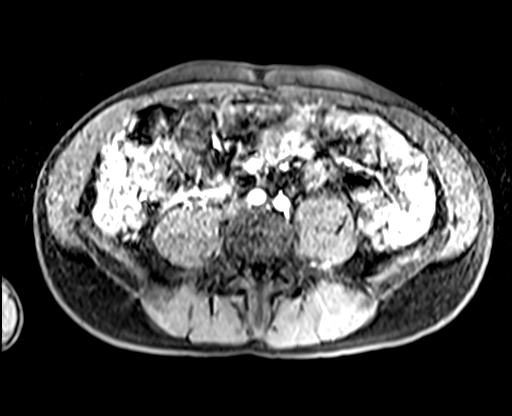
[im 80/80]
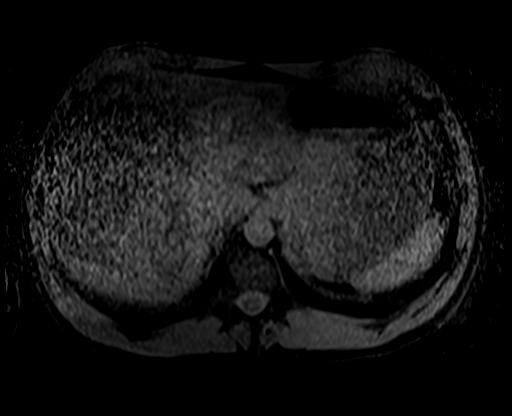

[Series 12: T1 dynamic post-contrast · axial · 4.0mm · 0.66mm/px · z∈[-228,+87]mm · 3 of 80 slices shown (2 of 4)]
[im 1/80]
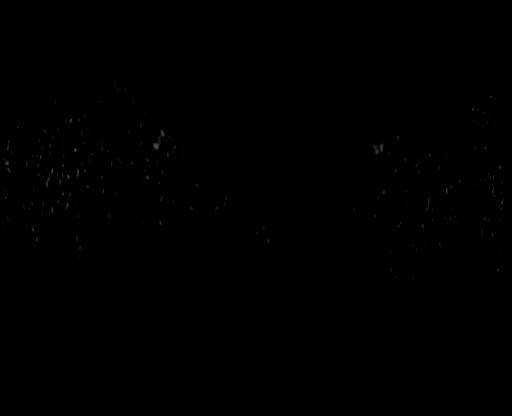
[im 40/80]
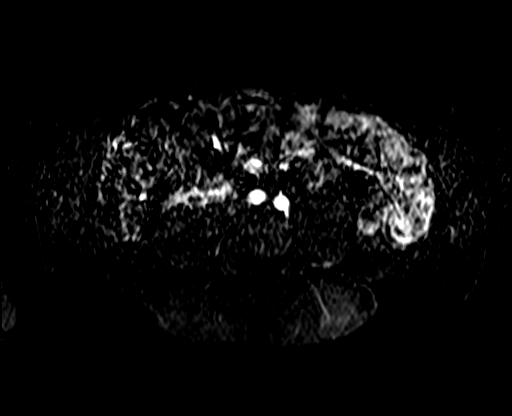
[im 80/80]
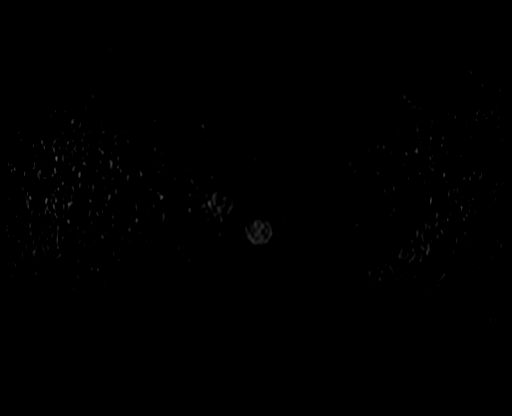

[Series 13: T1 dynamic post-contrast · axial · 4.0mm · 0.66mm/px · z∈[-228,+87]mm · 3 of 80 slices shown (3 of 4)]
[im 1/80]
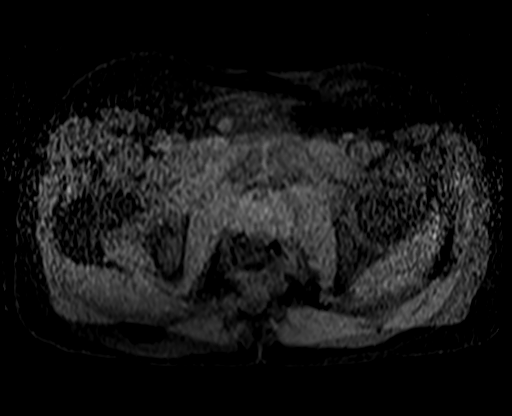
[im 40/80]
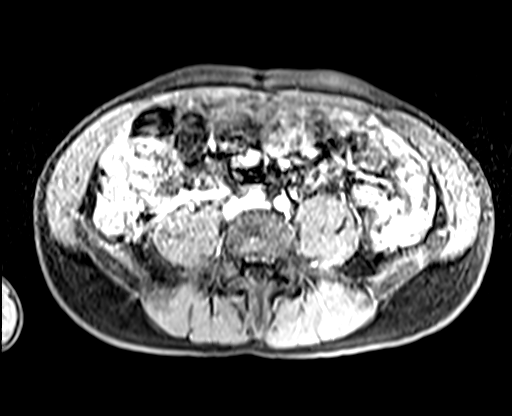
[im 80/80]
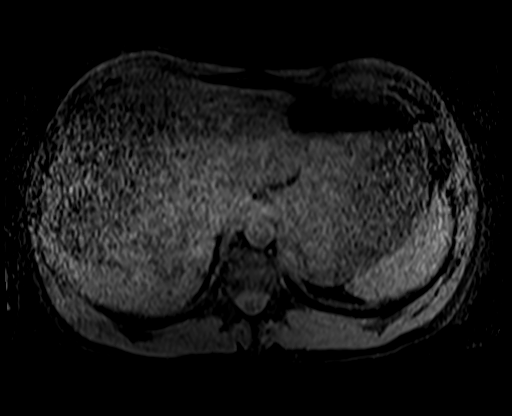

[Series 14: T1 dynamic post-contrast · axial · 4.0mm · 0.66mm/px · z∈[-228,+87]mm · 3 of 80 slices shown (4 of 4)]
[im 1/80]
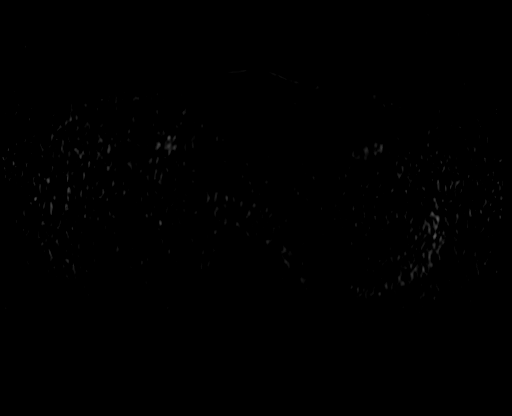
[im 40/80]
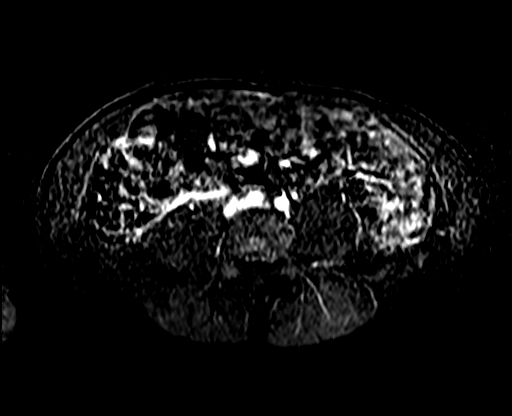
[im 80/80]
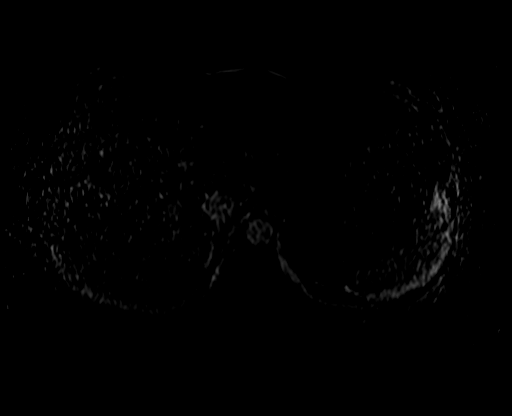

[31 of 48 positions shown; findings below may reference images not displayed]

FINDINGS: MR ABDOMEN FINDINGS

Lower chest:  Unremarkable where visualized

Hepatobiliary: Contracted gallbladder.  No biliary dilatation.

Pancreas: Unremarkable

Spleen: Unremarkable

Adrenals/Urinary Tract: 0.7 by 1.1 cm simple cyst of the left kidney
upper pole. Smaller cyst of the left mid kidney.

Stomach/Bowel: Mild prominence of stool in the ascending and
transverse colon, and in the rectal vault. Stomach and duodenum
unremarkable. Expected small bowel caliber and fold pattern. No
accentuated mucosal enhancement is identified to suggest an active
inflammatory process. I do not see a discrete small bowel mass.

Vascular/Lymphatic: Unremarkable

Other: No supplemental non-categorized findings.

Musculoskeletal: Unremarkable

MR PELVIS FINDINGS

Lower urinary Tract: Urinary bladder normal. No distal hydroureter.
No filling defect along the urothelium.

Stomach/Bowel: Prominence of stool in the rectal vault. No abnormal
mucosal enhancement or visualize mass involving pelvic small or
large bowel.

Vascular/Lymphatic: Unremarkable

Reproductive: No prostate abnormality identified.

Other: No supplemental non-categorized findings.

Musculoskeletal: Unremarkable
IMPRESSION: 1. Mild prominence of stool in the proximal colon and rectal vault,
query mild constipation.
2. No small bowel abnormality is identified.
3. No adenopathy or other specific findings of recurrent malignancy.

## 2016-08-07 ENCOUNTER — Encounter: Payer: Self-pay | Admitting: Internal Medicine

## 2016-08-13 ENCOUNTER — Ambulatory Visit: Payer: BLUE CROSS/BLUE SHIELD | Admitting: Internal Medicine

## 2016-10-17 DIAGNOSIS — M545 Low back pain: Secondary | ICD-10-CM | POA: Diagnosis not present

## 2016-10-23 DIAGNOSIS — M25561 Pain in right knee: Secondary | ICD-10-CM | POA: Diagnosis not present

## 2016-10-23 DIAGNOSIS — M7501 Adhesive capsulitis of right shoulder: Secondary | ICD-10-CM | POA: Diagnosis not present

## 2016-10-28 ENCOUNTER — Encounter: Payer: Self-pay | Admitting: Sports Medicine

## 2016-10-28 ENCOUNTER — Ambulatory Visit (INDEPENDENT_AMBULATORY_CARE_PROVIDER_SITE_OTHER): Payer: BLUE CROSS/BLUE SHIELD | Admitting: Sports Medicine

## 2016-10-28 ENCOUNTER — Ambulatory Visit
Admission: RE | Admit: 2016-10-28 | Discharge: 2016-10-28 | Disposition: A | Discharge status: Home / Self Care | Payer: BLUE CROSS/BLUE SHIELD | Source: Ambulatory Visit | Attending: Sports Medicine | Admitting: Sports Medicine

## 2016-10-28 ENCOUNTER — Other Ambulatory Visit: Payer: Self-pay | Admitting: Sports Medicine

## 2016-10-28 VITALS — BP 108/61 | Ht 70.5 in | Wt 150.0 lb

## 2016-10-28 DIAGNOSIS — M545 Low back pain, unspecified: Secondary | ICD-10-CM

## 2016-10-28 DIAGNOSIS — M546 Pain in thoracic spine: Secondary | ICD-10-CM | POA: Diagnosis not present

## 2016-10-28 NOTE — Progress Notes (Addendum)
   Subjective:    Patient ID: Manuel [name], male    DOB: 03-Mar-1981, 36 y.o.   MRN: [id number]  HPI chief complaint: Low back pain  Very pleasant 36 year old male comes in today complaining of long-standing low back pain which has become acutely worse over the past 3-4 months. His pain all began after a lumbar puncture in 2015 for meningitis. He underwent subsequent blood patches for a spinal leak and ever since then he has had chronic low back pain. An MRI scan of his lumbar spine done a year ago showed a mild disc bulge at L5-S1 but was otherwise unremarkable. Over the years he has tried several different treatments including medications, ice, exercise, and rest. About 3-4 months ago his pain worsened. He describes his pain as a "pressure" which is constantly present in his lumbar spine. Recently his pain has started to radiate into his lower to mid thoracic spine as well as into the left buttock. He also has constant numbness into the left buttock. He denies any radiating pain or numbness past the hip. No groin pain. His pain does awaken him at night. No fevers or chills. No prior low back surgery. He is referred to me today by his primary care physician.  Past medical history reviewed. It is significant for testicular cancer treated with left orchiectomy in March 2006. He also has a history of a right shoulder rotator cuff repair in 2015 and was recently diagnosed with POTS. Medications reviewed Allergies reviewed    Review of Systems    as above Objective:   Physical Exam  Well-developed, fit appearing. No acute distress. Awake alert and oriented 3. Vital signs reviewed  Lumbar spine: Full lumbar range of motion. He is diffusely tender to palpation along the lumbothoracic midline as well as along the paraspinal musculature. No appreciable spasm. Neurological exam shows no focal neurological deficits of either lower extremity      Assessment & Plan:   Worsening mid to low  back pain History of testicular cancer History of lumbar puncture with subsequent blood patching  Although the patient had a rather unremarkable MRI of his lumbar spine in early 2017 his recent worsening of symptoms coupled with his history of testicular cancer have me concerned. I think we should repeat imaging of both the thoracic and lumbar spine. I will start with getting x-rays and if unremarkable we will proceed with MRIs. I will call him with those studies once available. If there is no evidence of metastatic cancer then we will discuss formal physical therapy with Barbaraann Barthel.  Addendum: Other than some mild scoliosis, his x-rays are unremarkable. Proceed with MRI scan.

## 2016-10-31 DIAGNOSIS — M545 Low back pain: Secondary | ICD-10-CM | POA: Diagnosis not present

## 2016-10-31 DIAGNOSIS — M5127 Other intervertebral disc displacement, lumbosacral region: Secondary | ICD-10-CM | POA: Diagnosis not present

## 2016-11-01 ENCOUNTER — Other Ambulatory Visit: Payer: BLUE CROSS/BLUE SHIELD

## 2016-11-05 ENCOUNTER — Encounter: Payer: Self-pay | Admitting: Sports Medicine

## 2016-11-05 ENCOUNTER — Telehealth: Payer: Self-pay | Admitting: Sports Medicine

## 2016-11-05 NOTE — Telephone Encounter (Signed)
  Patient was notified via telephone today that MRIs of his thoracic spine and lumbar spine are essentially normal. I will refer him to physical therapy where he can wean to a home exercise program per the therapist's discretion. Follow-up with me as needed.

## 2016-11-25 DIAGNOSIS — M545 Low back pain: Secondary | ICD-10-CM | POA: Diagnosis not present

## 2016-12-04 DIAGNOSIS — M545 Low back pain: Secondary | ICD-10-CM | POA: Diagnosis not present

## 2016-12-07 ENCOUNTER — Encounter: Payer: Self-pay | Admitting: Sports Medicine

## 2016-12-10 DIAGNOSIS — M545 Low back pain: Secondary | ICD-10-CM | POA: Diagnosis not present

## 2016-12-18 DIAGNOSIS — R0981 Nasal congestion: Secondary | ICD-10-CM | POA: Diagnosis not present

## 2016-12-19 DIAGNOSIS — M545 Low back pain: Secondary | ICD-10-CM | POA: Diagnosis not present

## 2017-01-13 DIAGNOSIS — Z713 Dietary counseling and surveillance: Secondary | ICD-10-CM | POA: Diagnosis not present

## 2017-02-27 DIAGNOSIS — S39013A Strain of muscle, fascia and tendon of pelvis, initial encounter: Secondary | ICD-10-CM | POA: Diagnosis not present

## 2017-03-07 ENCOUNTER — Other Ambulatory Visit: Payer: Self-pay | Admitting: Family Medicine

## 2017-03-07 DIAGNOSIS — R1032 Left lower quadrant pain: Secondary | ICD-10-CM

## 2017-03-10 ENCOUNTER — Ambulatory Visit
Admission: RE | Admit: 2017-03-10 | Discharge: 2017-03-10 | Disposition: A | Discharge status: Home / Self Care | Payer: BLUE CROSS/BLUE SHIELD | Source: Ambulatory Visit | Attending: Family Medicine | Admitting: Family Medicine

## 2017-03-10 DIAGNOSIS — R1030 Lower abdominal pain, unspecified: Secondary | ICD-10-CM | POA: Diagnosis not present

## 2017-03-10 DIAGNOSIS — R1032 Left lower quadrant pain: Secondary | ICD-10-CM

## 2017-03-12 ENCOUNTER — Other Ambulatory Visit: Payer: BLUE CROSS/BLUE SHIELD

## 2017-03-19 DIAGNOSIS — M79605 Pain in left leg: Secondary | ICD-10-CM | POA: Diagnosis not present

## 2017-03-19 DIAGNOSIS — M545 Low back pain: Secondary | ICD-10-CM | POA: Diagnosis not present

## 2017-03-19 DIAGNOSIS — S39013D Strain of muscle, fascia and tendon of pelvis, subsequent encounter: Secondary | ICD-10-CM | POA: Diagnosis not present

## 2017-03-19 DIAGNOSIS — M6281 Muscle weakness (generalized): Secondary | ICD-10-CM | POA: Diagnosis not present

## 2017-03-24 DIAGNOSIS — M6281 Muscle weakness (generalized): Secondary | ICD-10-CM | POA: Diagnosis not present

## 2017-03-24 DIAGNOSIS — M545 Low back pain: Secondary | ICD-10-CM | POA: Diagnosis not present

## 2017-03-24 DIAGNOSIS — M79605 Pain in left leg: Secondary | ICD-10-CM | POA: Diagnosis not present

## 2017-03-24 DIAGNOSIS — S39013D Strain of muscle, fascia and tendon of pelvis, subsequent encounter: Secondary | ICD-10-CM | POA: Diagnosis not present

## 2017-03-31 DIAGNOSIS — M545 Low back pain: Secondary | ICD-10-CM | POA: Diagnosis not present

## 2017-03-31 DIAGNOSIS — M6281 Muscle weakness (generalized): Secondary | ICD-10-CM | POA: Diagnosis not present

## 2017-03-31 DIAGNOSIS — M79605 Pain in left leg: Secondary | ICD-10-CM | POA: Diagnosis not present

## 2017-03-31 DIAGNOSIS — S39013D Strain of muscle, fascia and tendon of pelvis, subsequent encounter: Secondary | ICD-10-CM | POA: Diagnosis not present

## 2017-04-04 DIAGNOSIS — M6281 Muscle weakness (generalized): Secondary | ICD-10-CM | POA: Diagnosis not present

## 2017-04-04 DIAGNOSIS — M79605 Pain in left leg: Secondary | ICD-10-CM | POA: Diagnosis not present

## 2017-04-04 DIAGNOSIS — S39013D Strain of muscle, fascia and tendon of pelvis, subsequent encounter: Secondary | ICD-10-CM | POA: Diagnosis not present

## 2017-04-04 DIAGNOSIS — M545 Low back pain: Secondary | ICD-10-CM | POA: Diagnosis not present

## 2017-04-07 DIAGNOSIS — S39013D Strain of muscle, fascia and tendon of pelvis, subsequent encounter: Secondary | ICD-10-CM | POA: Diagnosis not present

## 2017-04-07 DIAGNOSIS — M6281 Muscle weakness (generalized): Secondary | ICD-10-CM | POA: Diagnosis not present

## 2017-04-07 DIAGNOSIS — M79605 Pain in left leg: Secondary | ICD-10-CM | POA: Diagnosis not present

## 2017-04-07 DIAGNOSIS — M545 Low back pain: Secondary | ICD-10-CM | POA: Diagnosis not present

## 2017-04-12 ENCOUNTER — Emergency Department (HOSPITAL_COMMUNITY)
Admission: EM | Admit: 2017-04-12 | Discharge: 2017-04-12 | Disposition: A | Discharge status: Home / Self Care | Payer: BLUE CROSS/BLUE SHIELD | Attending: Emergency Medicine | Admitting: Emergency Medicine

## 2017-04-12 ENCOUNTER — Encounter (HOSPITAL_COMMUNITY): Payer: Self-pay | Admitting: Emergency Medicine

## 2017-04-12 DIAGNOSIS — H53149 Visual discomfort, unspecified: Secondary | ICD-10-CM | POA: Insufficient documentation

## 2017-04-12 DIAGNOSIS — R112 Nausea with vomiting, unspecified: Secondary | ICD-10-CM | POA: Diagnosis not present

## 2017-04-12 DIAGNOSIS — E876 Hypokalemia: Secondary | ICD-10-CM

## 2017-04-12 DIAGNOSIS — Z8547 Personal history of malignant neoplasm of testis: Secondary | ICD-10-CM | POA: Diagnosis not present

## 2017-04-12 DIAGNOSIS — G43109 Migraine with aura, not intractable, without status migrainosus: Secondary | ICD-10-CM | POA: Diagnosis not present

## 2017-04-12 DIAGNOSIS — J039 Acute tonsillitis, unspecified: Secondary | ICD-10-CM | POA: Diagnosis not present

## 2017-04-12 DIAGNOSIS — R509 Fever, unspecified: Secondary | ICD-10-CM | POA: Insufficient documentation

## 2017-04-12 DIAGNOSIS — M791 Myalgia, unspecified site: Secondary | ICD-10-CM

## 2017-04-12 DIAGNOSIS — G43009 Migraine without aura, not intractable, without status migrainosus: Secondary | ICD-10-CM | POA: Diagnosis not present

## 2017-04-12 DIAGNOSIS — R109 Unspecified abdominal pain: Secondary | ICD-10-CM | POA: Insufficient documentation

## 2017-04-12 LAB — I-STAT CG4 LACTIC ACID, ED: Lactic Acid, Venous: 1.19 mmol/L (ref 0.5–1.9)

## 2017-04-12 LAB — CBC WITH DIFFERENTIAL/PLATELET
Basophils Absolute: 0 10*3/uL (ref 0.0–0.1)
Basophils Relative: 0 %
Eosinophils Absolute: 0 10*3/uL (ref 0.0–0.7)
Eosinophils Relative: 0 %
HCT: 41.4 % (ref 39.0–52.0)
Hemoglobin: 13.9 g/dL (ref 13.0–17.0)
Lymphocytes Relative: 5 %
Lymphs Abs: 0.4 10*3/uL — ABNORMAL LOW (ref 0.7–4.0)
MCH: 28.6 pg (ref 26.0–34.0)
MCHC: 33.6 g/dL (ref 30.0–36.0)
MCV: 85.2 fL (ref 78.0–100.0)
Monocytes Absolute: 0.5 10*3/uL (ref 0.1–1.0)
Monocytes Relative: 6 %
Neutro Abs: 8.1 10*3/uL — ABNORMAL HIGH (ref 1.7–7.7)
Neutrophils Relative %: 89 %
Platelets: 199 10*3/uL (ref 150–400)
RBC: 4.86 MIL/uL (ref 4.22–5.81)
RDW: 12.9 % (ref 11.5–15.5)
WBC: 9 10*3/uL (ref 4.0–10.5)

## 2017-04-12 LAB — URINALYSIS, ROUTINE W REFLEX MICROSCOPIC
Bilirubin Urine: NEGATIVE
Glucose, UA: NEGATIVE mg/dL
Hgb urine dipstick: NEGATIVE
Ketones, ur: 5 mg/dL — AB
Leukocytes, UA: NEGATIVE
Nitrite: NEGATIVE
Protein, ur: NEGATIVE mg/dL
Specific Gravity, Urine: 1.002 — ABNORMAL LOW (ref 1.005–1.030)
pH: 6 (ref 5.0–8.0)

## 2017-04-12 LAB — COMPREHENSIVE METABOLIC PANEL
ALT: 32 U/L (ref 17–63)
AST: 24 U/L (ref 15–41)
Albumin: 4.2 g/dL (ref 3.5–5.0)
Alkaline Phosphatase: 72 U/L (ref 38–126)
Anion gap: 8 (ref 5–15)
BUN: 16 mg/dL (ref 6–20)
CO2: 27 mmol/L (ref 22–32)
Calcium: 9 mg/dL (ref 8.9–10.3)
Chloride: 98 mmol/L — ABNORMAL LOW (ref 101–111)
Creatinine, Ser: 1.22 mg/dL (ref 0.61–1.24)
GFR calc Af Amer: >60 mL/min (ref >60)
GFR calc non Af Amer: >60 mL/min (ref >60)
Glucose, Bld: 170 mg/dL — ABNORMAL HIGH (ref 65–99)
Potassium: 3.4 mmol/L — ABNORMAL LOW (ref 3.5–5.1)
Sodium: 133 mmol/L — ABNORMAL LOW (ref 135–145)
Total Bilirubin: 1.7 mg/dL — ABNORMAL HIGH (ref 0.3–1.2)
Total Protein: 6.7 g/dL (ref 6.5–8.1)

## 2017-04-12 LAB — RAPID STREP SCREEN (MED CTR MEBANE ONLY): Streptococcus, Group A Screen (Direct): NEGATIVE

## 2017-04-12 MED ORDER — SODIUM CHLORIDE 0.9 % IV BOLUS (SEPSIS)
1000.0000 mL | Freq: Once | INTRAVENOUS | Status: AC
  Administered 2017-04-12: 1000 mL via INTRAVENOUS

## 2017-04-12 MED ORDER — METOCLOPRAMIDE HCL 5 MG/ML IJ SOLN
10.0000 mg | Freq: Once | INTRAMUSCULAR | Status: DC
  Filled 2017-04-12: qty 2

## 2017-04-12 MED ORDER — DEXAMETHASONE SODIUM PHOSPHATE 10 MG/ML IJ SOLN
10.0000 mg | Freq: Once | INTRAMUSCULAR | Status: DC
  Filled 2017-04-12: qty 1

## 2017-04-12 MED ORDER — PENICILLIN G BENZATHINE 1200000 UNIT/2ML IM SUSP
1.2000 10*6.[IU] | Freq: Once | INTRAMUSCULAR | Status: AC
  Administered 2017-04-12: 1.2 10*6.[IU] via INTRAMUSCULAR
  Filled 2017-04-12: qty 2

## 2017-04-12 MED ORDER — POTASSIUM CHLORIDE CRYS ER 20 MEQ PO TBCR
40.0000 meq | EXTENDED_RELEASE_TABLET | Freq: Once | ORAL | Status: AC
  Administered 2017-04-12: 40 meq via ORAL
  Filled 2017-04-12: qty 2

## 2017-04-12 MED ORDER — ACETAMINOPHEN 500 MG PO TABS
1000.0000 mg | ORAL_TABLET | Freq: Once | ORAL | Status: AC
  Administered 2017-04-12: 1000 mg via ORAL
  Filled 2017-04-12: qty 2

## 2017-04-12 NOTE — ED Triage Notes (Signed)
Pt to ER from home for evaluation of concern for "bacterial meningitis." states hx of viral meningitis. States was in normal health last night when he went to bed but woke up this morning with "severe neck pain and a migraine." +photophobia, body aches, temp 102.5 at triage. Denies any other symptoms.

## 2017-04-12 NOTE — ED Notes (Signed)
Pt felt like he was going to pass out after receiving the penicillin injection.  Had patient lay back down on stretcher and restarted the hanging fluid bolus to help him feel better.

## 2017-04-12 NOTE — ED Notes (Signed)
Pt has been running a fever of 103.5 subjectively since this am without medication-- states does not normally take any medication-- hx of POTS-- does not take any ibuprofen or NSAIDS-- does have a sore throat, woke up with pain in arms/legs/ back/neck/ severe headache--  Pt has hx of meningitis, mother is a physician -- called to speak to EDP requesting pt have a lumbar puncture in IR--

## 2017-04-12 NOTE — ED Notes (Signed)
Throat slightly red/swollen, nauseated. Photophobic-- wearing sunglasses.

## 2017-04-12 NOTE — ED Notes (Signed)
Pt requesting to not have decadron and reglan until after tylenol--" to see if fever goes down"

## 2017-04-12 NOTE — ED Provider Notes (Signed)
[facility] DEPT Provider Note   CSN: [id number] Arrival date & time: 04/12/17  1512     History   Chief Complaint Chief Complaint  Patient presents with  . Migraine  . Neck Pain  . Generalized Body Aches    HPI Manuel [name] is a 36 y.o. male.  HPI Patient states he woke up not feeling well this morning. Complains of generalized malaise, diffuse myalgias including neck and back, generalized headache, photophobia, nausea and sore throat. Has had fever up to 102 at home. Said mild lower abdominal pain but denies dysuria, frequency or hematuria. No chest pain, shortness of breath or cough. No new rashes. No recent foreign travel. No sick contacts. No known tick or bug bites. States he's had some mild confusion but denies any focal weakness or numbness. Past Medical History:  Diagnosis Date  . Bilateral flank pain 07/17/2012  . Concussion    pt reports he has hx of multiple concussions, last one was 2009  . Hypotension   . Migraine   . Seizure ([place])   . Testicular cancer ([place]) 2006  . Testicular cancer ([place]) 07/17/2012  . Testicular cancer [facility])     Patient Active Problem List   Diagnosis Date Noted  . Migraine 10/05/2015  . Subacute confusional state 10/05/2015  . Headache 10/05/2015  . Vision changes 10/05/2015  . Nausea without vomiting 10/05/2015  . Positional headache 10/05/2015  . Complication of lumbar puncture 10/05/2015  . 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 Viral meningitis 04/08/2014  . Testicular cancer ([place]) 07/17/2012  . Bilateral flank pain 07/17/2012  . Left sided numbness 04/24/2012  . Syncope 04/24/2012    Past Surgical History:  Procedure Laterality Date  . ORCHIECTOMY    . ROTATOR CUFF REPAIR    . SURGERY SCROTAL / TESTICULAR  2006       Home Medications    Prior to Admission medications   Not on File    Family History Family History  Problem Relation Age of Onset  . Migraines Mother   . Diabetes type II Father      Social History Social History  Substance Use Topics  . Smoking status: Never Smoker  . Smokeless tobacco: Never Used  . Alcohol use 0.6 oz/week    1 Glasses of wine per week     Comment: occainsionally     Allergies   Contrast media [iodinated diagnostic agents]; Iodine; Shellfish allergy; Codeine; Doxycycline; Dilaudid [hydromorphone hcl]; Nsaids; Tetracyclines & related; Erythromycin; and Prednisone   Review of Systems Review of Systems  Constitutional: Positive for activity change, chills, fatigue and fever.  HENT: Positive for sore throat. Negative for congestion, ear pain, rhinorrhea, sinus pain, sinus pressure and trouble swallowing.   Eyes: Positive for photophobia. Negative for visual disturbance.  Respiratory: Negative for cough, shortness of breath and wheezing.   Gastrointestinal: Positive for abdominal pain, nausea and vomiting. Negative for blood in stool, constipation and diarrhea.  Genitourinary: Negative for dysuria, flank pain, frequency and hematuria.  Musculoskeletal: Positive for back pain, myalgias and neck pain. Negative for arthralgias and neck stiffness.  Neurological: Positive for headaches. Negative for dizziness, weakness, light-headedness and numbness.  Psychiatric/Behavioral: Positive for confusion.  All other systems reviewed and are negative.    Physical Exam Updated Vital Signs BP (!) 99/56   Pulse 88   Temp (!) 101.8 F (38.8 C) (Oral)   Resp 14   SpO2 99%   Physical Exam  Constitutional: He is oriented to person, place,  and time. He appears well-developed and well-nourished. No distress.  HENT:  Head: Normocephalic and atraumatic.  Mouth/Throat: Oropharynx is clear and moist.  No nasal mucosal edema. No sinus tenderness to percussion. Patient has bilateral tonsillar hypertrophy with erythema. Uvula is midline.  Eyes: Pupils are equal, round, and reactive to light. EOM are normal.  Neck: Normal range of motion. Neck supple.  No  meningismus. No midline cervical tenderness to palpation. Patient does have anterior cervical lymphadenopathy  Cardiovascular: Normal rate and regular rhythm.  Exam reveals no gallop and no friction rub.   No murmur heard. Pulmonary/Chest: Effort normal and breath sounds normal. No respiratory distress. He has no wheezes. He has no rales. He exhibits no tenderness.  Abdominal: Soft. Bowel sounds are normal. There is no tenderness. There is no rebound and no guarding.  No abdominal tenderness to palpation especially over McBurney's point. No rebound or guarding.  Musculoskeletal: Normal range of motion. He exhibits no edema or tenderness.  No midline thoracic or lumbar tenderness. No CVA tenderness. No lower extremity swelling or asymmetry. Distal pulses are 2+.  Neurological: He is alert and oriented to person, place, and time.  Patient is alert and oriented x3 with clear, goal oriented speech. Patient has 5/5 motor in all extremities. Sensation is intact to light touch.  Skin: Skin is warm and dry. Capillary refill takes less than 2 seconds. No rash noted. He is not diaphoretic. No erythema.  No obvious rashes  Psychiatric: He has a normal mood and affect. His behavior is normal.  Nursing note and vitals reviewed.    ED Treatments / Results  Labs (all labs ordered are listed, but only abnormal results are displayed) Labs Reviewed  COMPREHENSIVE METABOLIC PANEL - Abnormal; Notable for the following:       Result Value   Sodium 133 (*)    Potassium 3.4 (*)    Chloride 98 (*)    Glucose, Bld 170 (*)    Total Bilirubin 1.7 (*)    All other components within normal limits  CBC WITH DIFFERENTIAL/PLATELET - Abnormal; Notable for the following:    Neutro Abs 8.1 (*)    Lymphs Abs 0.4 (*)    All other components within normal limits  URINALYSIS, ROUTINE W REFLEX MICROSCOPIC - Abnormal; Notable for the following:    Color, Urine COLORLESS (*)    Specific Gravity, Urine 1.002 (*)     Ketones, ur 5 (*)    All other components within normal limits  RAPID STREP SCREEN (NOT AT [facility])  CULTURE, GROUP A STREP ([place])  I-STAT CG4 LACTIC ACID, ED    EKG  EKG Interpretation None       Radiology No results found.  Procedures Procedures (including critical care time)  Medications Ordered in ED Medications  sodium chloride 0.9 % bolus 1,000 mL (0 mLs Intravenous Stopped 04/12/17 1933)  acetaminophen (TYLENOL) tablet 1,000 mg (1,000 mg Oral Given 04/12/17 1650)  potassium chloride SA (K-DUR,KLOR-CON) CR tablet 40 mEq (40 mEq Oral Given 04/12/17 2125)  penicillin g benzathine (BICILLIN LA) 1200000 UNIT/2ML injection 1.2 Million Units (1.2 Million Units Intramuscular Given 04/12/17 2126)     Initial Impression / Assessment and Plan / ED Course  I have reviewed the triage vital signs and the nursing notes.  Pertinent labs & imaging results that were available during my care of the patient were reviewed by me and considered in my medical decision making (see chart for details).     Symptoms suspicious for tonsillitis.  PatientWith no evidence of encephalitis/meningitis. No meningismus and normal neurologic exam. Patient with normal white blood cell count. Mildly low sodium and potassium. Given oral replacement. Patient refused Decadron and Reglan for headache. States he is feeling some better after Tylenol and IV fluids. He continues to have no focal neurologic symptoms and no evidence of meningitis. Discussed option of lumbar puncture. Think the risk of meningitis, especially bacterial is very low. Patient and patient's significant other agree with not pursuing lumbar puncture at this time Rapid strep was negative but sent for culture. Have offered to treat for possible strep tonsillitis. Patient was given penicillin IM. Patient observed extent. Time in the emergency department and given IV fluids. He is requesting to be discharged home. Understands the need to return immediately  for any worsening of his symptoms or if he develops neck stiffness, weakness, lethargy, rash or any concerns. Final Clinical Impressions(s) / ED Diagnoses   Final diagnoses:  Acute tonsillitis, unspecified etiology  Migraine without aura and without status migrainosus, not intractable  Myalgia  Hypokalemia    New Prescriptions There are no discharge medications for this patient.    Julianne Rice, MD 04/13/17 1427

## 2017-04-14 LAB — CULTURE, GROUP A STREP (THRC)

## 2017-07-14 DIAGNOSIS — Z8547 Personal history of malignant neoplasm of testis: Secondary | ICD-10-CM | POA: Diagnosis not present

## 2017-07-14 DIAGNOSIS — R3913 Splitting of urinary stream: Secondary | ICD-10-CM | POA: Diagnosis not present

## 2017-07-14 DIAGNOSIS — R300 Dysuria: Secondary | ICD-10-CM | POA: Diagnosis not present

## 2017-07-14 DIAGNOSIS — R3915 Urgency of urination: Secondary | ICD-10-CM | POA: Diagnosis not present

## 2017-07-28 DIAGNOSIS — Z8547 Personal history of malignant neoplasm of testis: Secondary | ICD-10-CM | POA: Diagnosis not present

## 2017-07-28 DIAGNOSIS — R310 Gross hematuria: Secondary | ICD-10-CM | POA: Diagnosis not present

## 2017-08-06 DIAGNOSIS — Z8547 Personal history of malignant neoplasm of testis: Secondary | ICD-10-CM | POA: Diagnosis not present

## 2017-08-06 DIAGNOSIS — R300 Dysuria: Secondary | ICD-10-CM | POA: Diagnosis not present

## 2017-08-06 DIAGNOSIS — A630 Anogenital (venereal) warts: Secondary | ICD-10-CM | POA: Diagnosis not present

## 2017-11-26 DIAGNOSIS — J101 Influenza due to other identified influenza virus with other respiratory manifestations: Secondary | ICD-10-CM | POA: Diagnosis not present

## 2017-11-26 DIAGNOSIS — R6883 Chills (without fever): Secondary | ICD-10-CM | POA: Diagnosis not present

## 2017-11-28 ENCOUNTER — Other Ambulatory Visit: Payer: Self-pay | Admitting: Physician Assistant

## 2017-11-28 ENCOUNTER — Ambulatory Visit
Admission: RE | Admit: 2017-11-28 | Discharge: 2017-11-28 | Disposition: A | Discharge status: Home / Self Care | Payer: BLUE CROSS/BLUE SHIELD | Source: Ambulatory Visit | Attending: Physician Assistant | Admitting: Physician Assistant

## 2017-11-28 DIAGNOSIS — R0602 Shortness of breath: Secondary | ICD-10-CM

## 2017-11-28 DIAGNOSIS — J101 Influenza due to other identified influenza virus with other respiratory manifestations: Secondary | ICD-10-CM | POA: Diagnosis not present

## 2017-11-28 DIAGNOSIS — J111 Influenza due to unidentified influenza virus with other respiratory manifestations: Secondary | ICD-10-CM

## 2017-12-12 DIAGNOSIS — H6120 Impacted cerumen, unspecified ear: Secondary | ICD-10-CM | POA: Diagnosis not present

## 2018-05-14 DIAGNOSIS — R350 Frequency of micturition: Secondary | ICD-10-CM | POA: Diagnosis not present

## 2018-05-14 DIAGNOSIS — N5201 Erectile dysfunction due to arterial insufficiency: Secondary | ICD-10-CM | POA: Diagnosis not present

## 2018-05-19 DIAGNOSIS — N5201 Erectile dysfunction due to arterial insufficiency: Secondary | ICD-10-CM | POA: Diagnosis not present

## 2018-05-19 DIAGNOSIS — N410 Acute prostatitis: Secondary | ICD-10-CM | POA: Diagnosis not present

## 2018-05-19 DIAGNOSIS — R350 Frequency of micturition: Secondary | ICD-10-CM | POA: Diagnosis not present

## 2018-09-01 DIAGNOSIS — R3915 Urgency of urination: Secondary | ICD-10-CM | POA: Diagnosis not present

## 2018-09-01 DIAGNOSIS — R350 Frequency of micturition: Secondary | ICD-10-CM | POA: Diagnosis not present

## 2018-09-11 DIAGNOSIS — R102 Pelvic and perineal pain: Secondary | ICD-10-CM | POA: Diagnosis not present

## 2018-09-11 DIAGNOSIS — R300 Dysuria: Secondary | ICD-10-CM | POA: Diagnosis not present

## 2018-09-11 DIAGNOSIS — M6281 Muscle weakness (generalized): Secondary | ICD-10-CM | POA: Diagnosis not present

## 2018-09-11 DIAGNOSIS — M62838 Other muscle spasm: Secondary | ICD-10-CM | POA: Diagnosis not present

## 2018-09-15 DIAGNOSIS — B349 Viral infection, unspecified: Secondary | ICD-10-CM | POA: Diagnosis not present

## 2018-09-15 DIAGNOSIS — R509 Fever, unspecified: Secondary | ICD-10-CM | POA: Diagnosis not present

## 2018-09-23 DIAGNOSIS — M6281 Muscle weakness (generalized): Secondary | ICD-10-CM | POA: Diagnosis not present

## 2018-09-23 DIAGNOSIS — R102 Pelvic and perineal pain: Secondary | ICD-10-CM | POA: Diagnosis not present

## 2018-09-23 DIAGNOSIS — R300 Dysuria: Secondary | ICD-10-CM | POA: Diagnosis not present

## 2018-09-23 DIAGNOSIS — M62838 Other muscle spasm: Secondary | ICD-10-CM | POA: Diagnosis not present

## 2018-10-07 DIAGNOSIS — R300 Dysuria: Secondary | ICD-10-CM | POA: Diagnosis not present

## 2018-10-07 DIAGNOSIS — R102 Pelvic and perineal pain: Secondary | ICD-10-CM | POA: Diagnosis not present

## 2018-10-07 DIAGNOSIS — M6281 Muscle weakness (generalized): Secondary | ICD-10-CM | POA: Diagnosis not present

## 2018-10-07 DIAGNOSIS — M62838 Other muscle spasm: Secondary | ICD-10-CM | POA: Diagnosis not present

## 2019-01-13 DIAGNOSIS — M25562 Pain in left knee: Secondary | ICD-10-CM | POA: Diagnosis not present

## 2019-01-19 ENCOUNTER — Telehealth: Payer: Self-pay

## 2019-01-19 NOTE — Telephone Encounter (Signed)
I called and left patient a message about setting up telehealth visit. Patient is on Dr. Olin Pia recall list. We have a few spots open on Friday 01/22/19.

## 2019-02-05 NOTE — Telephone Encounter (Signed)
Patient did not return call to set up appointment.

## 2019-04-19 ENCOUNTER — Ambulatory Visit: Payer: BLUE CROSS/BLUE SHIELD | Admitting: Internal Medicine

## 2019-05-03 NOTE — Progress Notes (Signed)
CARDIOLOGY OFFICE  NOTE  Patient ID: Manuel [name], MRN: [id number], DOB/AGE: 1980/12/12 38 y.o. Admit date: (Not on file) Date of Consult: 05/04/2019  Primary Physician: Gaynelle Arabian, MD Primary Cardiologist: SK  Manuel [name] is a 38 y.o. male who is being seen today for  re evaluation of POTS .   I last saw the patient > 3 yrs ago   Chief Complaint:     HPI Manuel [name] is a 38 y.o. male  With hx of testicular cancer which predated by about 10 yrs, symptoms of postural and exercise intolerance which appeared relatively abruptly 2017.  They improved with salt and water repletion   He remains much improved  Is able to play tennis but avoids hot showers and hot tubs and alcohol  Uses compressive wear   Past Medical History:  Diagnosis Date  . Bilateral flank pain 07/17/2012  . Complication of lumbar puncture 10/05/2015  . Concussion    pt reports he has hx of multiple concussions, last one was 2009  . Headache 10/05/2015  . Hypotension   . Left sided numbness 04/24/2012  . Migraine   . Nausea without vomiting 10/05/2015  . Seizure ([place])   . Subacute confusional state 10/05/2015  . Syncope 04/24/2012  . Testicular cancer ([place]) 2006  . Testicular cancer ([place]) 07/17/2012  . Testicular cancer ([place])   . 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 Viral meningitis 04/08/2014  . Vision changes 10/05/2015      Surgical History:  Past Surgical History:  Procedure Laterality Date  . ORCHIECTOMY    . ROTATOR CUFF REPAIR    . SURGERY SCROTAL / TESTICULAR  2006     Home Meds: Prior to Admission medications   Not on File     Allergies:  Allergies  Allergen Reactions  . Contrast Media [Iodinated Diagnostic Agents] Anaphylaxis  . Iodine Anaphylaxis    Other reaction(s): anaphylaxis  . Shellfish Allergy Anaphylaxis  . Codeine Nausea Only    GI upset  . Doxycycline Nausea And Vomiting  . Dilaudid [Hydromorphone Hcl] Other (See Comments)    Nausea  . Nsaids  Other (See Comments)  . Tetracyclines & Related   . Erythromycin Hives and Itching  . Prednisone Anxiety    Stomach upset    Social History   Socioeconomic History  . Marital status: Married    Spouse name: [name]  . Number of children: 2  . Years of education: 74  . Highest education level: Not on file  Occupational History  . Not on file  Social Needs  . Financial resource strain: Not on file  . Food insecurity    Worry: Not on file    Inability: Not on file  . Transportation needs    Medical: Not on file    Non-medical: Not on file  Tobacco Use  . Smoking status: Never Smoker  . Smokeless tobacco: Never Used  Substance and Sexual Activity  . Alcohol use: Yes    Alcohol/week: 1.0 standard drinks    Types: 1 Glasses of wine per week    Comment: occainsionally  . Drug use: No  . Sexual activity: Not on file  Lifestyle  . Physical activity    Days per week: Not on file    Minutes per session: Not on file  . Stress: Not on file  Relationships  . Social Herbalist on phone: Not on file    Gets  together: Not on file    Attends religious service: Not on file    Active member of club or organization: Not on file    Attends meetings of clubs or organizations: Not on file    Relationship status: Not on file  . Intimate partner violence    Fear of current or ex partner: Not on file    Emotionally abused: Not on file    Physically abused: Not on file    Forced sexual activity: Not on file  Other Topics Concern  . Not on file  Social History Narrative   Lives with wife and kids   Caffeine use: none     Family History  Problem Relation Age of Onset  . Migraines Mother   . Diabetes type II Father      ROS:  Please see the history of present illness.     All other systems reviewed and negative.    Physical Exam: Blood pressure 116/70, pulse 68, height 5' 10.5" (1.791 m), weight 166 lb 12.8 oz (75.7 kg), SpO2 97 %. General: Well developed, well  nourished male in no acute distress. Head: Normocephalic, atraumatic, sclera non-icteric, no xanthomas, nares are without discharge. EENT: normal  Lymph Nodes:  none Neck: Negative for carotid bruits. JVD not elevated. Back:without scoliosis kyphosis  Lungs: Clear bilaterally to auscultation without wheezes, rales, or rhonchi. Breathing is unlabored. Heart: RRR with S1 S2. No   murmur . No rubs, or gallops appreciated. Abdomen: Soft, non-tender, non-distended with normoactive bowel sounds. No hepatomegaly. No rebound/guarding. No obvious abdominal masses. Msk:  Strength and tone appear normal for age. Extremities: No clubbing or cyanosis. No edema.  Distal pedal pulses are 2+ and equal bilaterally. Skin: Warm and Dry Neuro: Alert and oriented X 3. CN [name]-XII intact Grossly normal sensory and motor function . Psych:  Responds to questions appropriately with a normal affect.      Labs: ChemistryNo results for input(s): NA, K, CL, CO2, GLUCOSE, BUN, CREATININE, CALCIUM, PROT, ALBUMIN, AST, ALT, ALKPHOS, BILITOT, GFRNONAA, GFRAA, ANIONGAP in the last 168 hours.   HematologyNo results for input(s): WBC, RBC, HGB, HCT, MCV, MCH, MCHC, RDW, PLT in the last 168 hours.  Cardiac EnzymesNo results for input(s): TROPONINI in the last 168 hours. No results for input(s): TROPIPOC in the last 168 hours.   BNPNo results for input(s): BNP, PROBNP in the last 168 hours.   DDimer No results for input(s): DDIMER in the last 168 hours.   Lab Results  Component Value Date   CHOL 196 04/24/2012   HDL 64 04/24/2012   LDLCALC 121 (H) 04/24/2012   TRIG 55 04/24/2012    Thyroid Function Tests: No results for input(s): TSH, T4TOTAL, T3FREE, THYROIDAB in the last 72 hours.  Invalid input(s): FREET3 Miscellaneous Lab Results  Component Value Date   DDIMER <0.27 12/09/2014    Radiology/Studies:  No results found.  EKG: sinus @ 67 14/09/40   Assessment and Plan:  POTS   Testicular  cancer   Doing exceedingly well   As adjusted life style to accomplish resumption of living Suggested tri-oral; pedialyte advanced care, liquid IV  Will see as needed    Virl Axe

## 2019-05-04 ENCOUNTER — Other Ambulatory Visit: Payer: Self-pay

## 2019-05-04 ENCOUNTER — Ambulatory Visit (INDEPENDENT_AMBULATORY_CARE_PROVIDER_SITE_OTHER): Payer: BC Managed Care – PPO | Admitting: Internal Medicine

## 2019-05-04 ENCOUNTER — Encounter: Payer: Self-pay | Admitting: Internal Medicine

## 2019-05-04 VITALS — BP 116/70 | HR 68 | Ht 70.5 in | Wt 166.8 lb

## 2019-05-04 DIAGNOSIS — R55 Syncope and collapse: Secondary | ICD-10-CM

## 2019-05-04 NOTE — Patient Instructions (Signed)
Medication Instructions:  Your physician recommends that you continue on your current medications as directed. Please refer to the Current Medication list given to you today.  Labwork: None ordered.  Testing/Procedures: None ordered.  Follow-Up: Your physician recommends that you schedule a follow-up appointment as needed with Dr Klein  Any Other Special Instructions Will Be Listed Below (If Applicable).     If you need a refill on your cardiac medications before your next appointment, please call your pharmacy.  

## 2019-05-25 ENCOUNTER — Ambulatory Visit (INDEPENDENT_AMBULATORY_CARE_PROVIDER_SITE_OTHER): Payer: BC Managed Care – PPO

## 2019-05-25 ENCOUNTER — Ambulatory Visit (INDEPENDENT_AMBULATORY_CARE_PROVIDER_SITE_OTHER): Payer: BC Managed Care – PPO | Admitting: Podiatry

## 2019-05-25 ENCOUNTER — Ambulatory Visit: Payer: BC Managed Care – PPO

## 2019-05-25 ENCOUNTER — Other Ambulatory Visit: Payer: Self-pay | Admitting: Podiatry

## 2019-05-25 ENCOUNTER — Other Ambulatory Visit: Payer: Self-pay

## 2019-05-25 DIAGNOSIS — M722 Plantar fascial fibromatosis: Secondary | ICD-10-CM

## 2019-05-25 DIAGNOSIS — M7752 Other enthesopathy of left foot: Secondary | ICD-10-CM | POA: Diagnosis not present

## 2019-05-25 NOTE — Patient Instructions (Signed)

## 2019-05-31 NOTE — Progress Notes (Signed)
Subjective:   Patient ID: Manuel [name], male   DOB: 38 y.o.   MRN: [id number]   HPI Patient is found to have discomfort in the plantar heel region right over left stating it is been long-term and he is an active person and likes to do different sports.  Patient states it is worse after periods of sitting and seems to gradual becoming more of an issue for him and he does like to be active and does not smoke   Review of Systems  All other systems reviewed and are negative.       Objective:  Physical Exam Vitals signs and nursing note reviewed.  Constitutional:      Appearance: He is well-developed.  Pulmonary:     Effort: Pulmonary effort is normal.  Musculoskeletal: Normal range of motion.  Skin:    General: Skin is warm.  Neurological:     Mental Status: He is alert.     Neurovascular status intact muscle strength adequate range of motion within normal limits with patient found to have discomfort plantar heel region right over left with inflammation fluid of the medial band.  Patient found a good digital perfusion well oriented x3 with moderate depression of the arch and a narrow heel cup     Assessment:  Acute plantar fasciitis right upper left with long-term nature to condition in an active person     Plan:  H&P condition reviewed in today sterile prep done injected the right plantar fascia 3 mg Kenalog 5 mg Xylocaine applied fascial brace gave instructions on physical therapy anti-inflammatories and reappoint to recheck and discuss long-term orthotics  X-rays indicate small spur no indication to stress fracture arthritis

## 2019-06-16 ENCOUNTER — Ambulatory Visit: Payer: BC Managed Care – PPO | Admitting: Podiatry

## 2019-06-16 ENCOUNTER — Other Ambulatory Visit: Payer: Self-pay

## 2019-06-16 DIAGNOSIS — M722 Plantar fascial fibromatosis: Secondary | ICD-10-CM

## 2019-06-16 NOTE — Progress Notes (Signed)
Subjective:   Patient ID: Elmo Putt III, male   DOB: 38 y.o.   MRN: [id number]   HPI Patient presents stating that the heel right is still bothering him quite a bit in the center of the heel and has been trying to be active and does play tennis several times and does get some discomfort with that and also states he knows he needs more support and the pain is worse when he gets up in the morning   ROS      Objective:  Physical Exam  Neurovascular status intact with patient found to have discomfort in the center of the right heel with inflammation fluid buildup and moderate depression of the arch     Assessment:  Fasciitis-like symptoms still present right foot deep palpation     Plan:  H&P condition reviewed and recommended long-term orthotics and patient is scheduled for orthotic casting with ped orthotist.  I went ahead today and I did apply night splint to try to stretch the foot and take stress off of it and will be seen back for casting by Liliane Channel and will need an orthotic for sports most likely of a softer variation.  I did reinject the fascia from the lateral side today 3 mg Kenalog 5 mg Xylocaine

## 2019-06-22 ENCOUNTER — Telehealth: Payer: Self-pay | Admitting: Podiatry

## 2019-06-22 NOTE — Telephone Encounter (Signed)
lvm for pt to call to schedule an appt with Liliane Channel for orthotics.Marland KitchenMarland Kitchen

## 2019-09-10 DIAGNOSIS — R1032 Left lower quadrant pain: Secondary | ICD-10-CM | POA: Diagnosis not present

## 2019-09-17 DIAGNOSIS — R1032 Left lower quadrant pain: Secondary | ICD-10-CM | POA: Diagnosis not present

## 2019-09-17 DIAGNOSIS — K5730 Diverticulosis of large intestine without perforation or abscess without bleeding: Secondary | ICD-10-CM | POA: Diagnosis not present

## 2020-09-05 ENCOUNTER — Telehealth: Payer: Self-pay | Admitting: Internal Medicine

## 2020-09-05 NOTE — Telephone Encounter (Signed)
Spoke with pt who states he has had a shoulder/neck injury and has been unable to exercise regularly and pt feels this has contributed to an exacerbation of his symptoms.  Pt reports he has been doing extremely well up until this time.  Pt continues to stay well hydrated and have adequate consumption of Na+.  He is also wearing compressive wear and beginning to ride his wife's exercise bike again.  Pt reports a syncopal event on 09/03/2020 and increased heart rates upon standing.  Pt last seen by Dr Graciela Husbands 05/2019.  Will have Dr Odessa Fleming scheduler call pt to schedule appointment.  Reviewed ED precautions.  Pt verbalizes understanding and agrees with current plan.

## 2020-09-05 NOTE — Telephone Encounter (Signed)
STAT if patient feels like he/she is going to faint   1) Are you dizzy now? No   2) Do you feel faint or have you passed out? Patient states he last passed out on 01/02/822  3) Do you have any other symptoms? Migraines, elevated BP/HR 4) Have you checked your HR and BP (record if available)?  BP: 111/74  Patient states HR readings have been in the 70's when at rest and when he stands up it will get up to 130.    Pt c/o Syncope: STAT if syncope occurred within 30 minutes and pt complains of lightheadedness High Priority if episode of passing out, completely, today or in last 24 hours   1. Did you pass out today? No    2. When is the last time you passed out? On 09/03/20   3. Has this occurred multiple times? Yes, every 2-3 days (last occurrence 09/03/20)  4. Did you have any symptoms prior to passing out? No, but he states he has migraines afterwards.   Pt c/o BP issue: STAT if pt c/o blurred vision, one-sided weakness or slurred speech  1. What are your last 5 BP readings?  111/74  2. Are you having any other symptoms (ex. Dizziness, headache, blurred vision, passed out)? Dizziness, migraines, syncope, elevated HR  3. What is your BP issue? BP has been elevated

## 2020-09-19 ENCOUNTER — Ambulatory Visit: Payer: BC Managed Care – PPO | Admitting: Student

## 2020-09-19 DIAGNOSIS — I498 Other specified cardiac arrhythmias: Secondary | ICD-10-CM | POA: Insufficient documentation

## 2020-09-19 DIAGNOSIS — G90A Postural orthostatic tachycardia syndrome (POTS): Secondary | ICD-10-CM | POA: Insufficient documentation

## 2020-09-20 ENCOUNTER — Other Ambulatory Visit: Payer: Self-pay

## 2020-09-20 ENCOUNTER — Ambulatory Visit (INDEPENDENT_AMBULATORY_CARE_PROVIDER_SITE_OTHER): Payer: BC Managed Care – PPO | Admitting: Internal Medicine

## 2020-09-20 ENCOUNTER — Encounter: Payer: Self-pay | Admitting: Internal Medicine

## 2020-09-20 VITALS — BP 132/60 | Wt 160.4 lb

## 2020-09-20 DIAGNOSIS — G90A Postural orthostatic tachycardia syndrome (POTS): Secondary | ICD-10-CM

## 2020-09-20 DIAGNOSIS — R55 Syncope and collapse: Secondary | ICD-10-CM | POA: Diagnosis not present

## 2020-09-20 DIAGNOSIS — I498 Other specified cardiac arrhythmias: Secondary | ICD-10-CM | POA: Diagnosis not present

## 2020-09-20 NOTE — Progress Notes (Signed)
CARDIOLOGY OFFICE  NOTE  Patient ID: ANKITH EDMONSTON, MRN: [id number], DOB/AGE: 01-04-1981 40 y.o. Admit date: (Not on file) Date of Consult: 09/20/2020  Primary Physician: Gaynelle Arabian, MD Primary Cardiologist: SK  Manuel [name] is a 39 y.o. male who is being seen today for  re evaluation of POTS .     Chief Complaint:     HPI Manuel [name] is a 40 y.o. male  With hx of testicular cancer which predated by about 10 yrs, symptoms of postural and exercise intolerance which appeared relatively abruptly 2017.  When last seen in 2020 he had scant symptoms.  Hydration salt and compressive wear had largely mitigated them.  About 3 months ago he has had a recurrence of problems.  These have included shower intolerance, difficulties with exercising.  He has had 1 syncopal and 1 presyncopal episode that occurred following showers, associated with a recognizable prodrome of 3 to 5 minutes duration, accompanied by pallor and significant residual fatigue.  He also has recently developed recurrence of his migraine headaches.  And in the same context has complaints of light sensitivity made much worse recently by our snow.  About 3 months ago he had to stop exercising.  He had problems with his shoulder.  No other clear temporal associations including no viral illnesses and no changes stress.  Sleep has been poor and worse than normal.  Showering at night because he showers in the day he has significant discombobulated shins and effort and concentration  He has a blood pressure heart rate monitor.  He was using it in the shower, and noticed that his heart rate went from 60s to the low 100s and his blood pressure went from 110--130s with his symptoms  No alcohol or marijuana use     Past Medical History:  Diagnosis Date  . Bilateral flank pain 07/17/2012  . Complication of lumbar puncture 10/05/2015  . Concussion    pt reports he has hx of multiple concussions, last  one was 2009  . Headache 10/05/2015  . Hypotension   . Left sided numbness 04/24/2012  . Migraine   . Nausea without vomiting 10/05/2015  . POTS (postural orthostatic tachycardia syndrome)   . Seizure ([place])   . Subacute confusional state 10/05/2015  . Syncope 04/24/2012  . Testicular cancer ([place]) 2006  . 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 Viral meningitis 04/08/2014  . Vision changes 10/05/2015      Surgical History:  Past Surgical History:  Procedure Laterality Date  . ORCHIECTOMY    . ROTATOR CUFF REPAIR    . SURGERY SCROTAL / TESTICULAR  2006     Home Meds: Prior to Admission medications   Not on File     Allergies:  Allergies  Allergen Reactions  . Contrast Media [Iodinated Diagnostic Agents] Anaphylaxis  . Iodine Anaphylaxis    Other reaction(s): anaphylaxis  . Shellfish Allergy Anaphylaxis  . Codeine Nausea Only    GI upset  . Doxycycline Nausea And Vomiting  . Dilaudid [Hydromorphone Hcl] Other (See Comments)    Nausea  . Nsaids Other (See Comments)  . Tetracyclines & Related   . Erythromycin Hives and Itching  . Prednisone Anxiety    Stomach upset      ROS:  Please see the history of present illness.     All other systems reviewed and negative.  BP 132/60 (BP Location: Right Arm, Patient Position: Sitting, Cuff Size: Normal)   Wt  160 lb 6.4 oz (72.8 kg)   BMI 22.69 kg/m   Well developed and nourished in no acute distress HENT normal Neck supple with JVP-  flat   Clear Regular rate and rhythm, no murmurs or gallops Abd-soft with active BS No Clubbing cyanosis edema Skin-warm and dry A & Oriented  Grossly normal sensory and motor function  ECG sinus at 67 Intervals 14/10/41   Assessment and Plan:  POTS in the past  Shower intolerance  Syncope/presyncope  Testicular cancer    The patient has shower intolerance but interestingly has hypertension as opposed to normotension and heart rates while they are increasing some are not increasing terribly.   This would not be consistent with POTS.  It might be associated with a hyperadrenergic state however the cause of which is not clear to me but may fit under the rubric of dysautonomia.  The initial blood pressure recordings may be anomalous; hence, prior to making index recommendation he will get p.o. about a weeks worth of shower data.  He has a history of migraine headaches and is suggested that he follow-up with Dr. Lavell Anchors especially given the light sensitivity for which I do not have a good understanding       Virl Axe

## 2020-09-20 NOTE — Patient Instructions (Signed)
Medication Instructions:  Your physician recommends that you continue on your current medications as directed. Please refer to the Current Medication list given to you today.  *If you need a refill on your cardiac medications before your next appointment, please call your pharmacy*   Lab Work: None ordered   Testing/Procedures: Your physician has requested that you have an echocardiogram. Echocardiography is a painless test that uses sound waves to create images of your heart. It provides your doctor with information about the size and shape of your heart and how well your heart's chambers and valves are working. This procedure takes approximately one hour. There are no restrictions for this procedure.   Follow-Up: At [facility], you and your health needs are our priority.  As part of our continuing mission to provide you with exceptional heart care, we have created designated Provider Care Teams.  These Care Teams include your primary Cardiologist (physician) and Advanced Practice Providers (APPs -  Physician Assistants and Nurse Practitioners) who all work together to provide you with the care you need, when you need it.  Your next appointment:   3 month(s)  The format for your next appointment:   In Person  Provider:   Virl Axe, MD    Thank you for choosing [facility]!!     Other Instructions

## 2020-09-26 ENCOUNTER — Ambulatory Visit (HOSPITAL_COMMUNITY): Payer: BC Managed Care – PPO | Attending: Internal Medicine

## 2020-09-26 ENCOUNTER — Other Ambulatory Visit: Payer: Self-pay

## 2020-09-26 DIAGNOSIS — R55 Syncope and collapse: Secondary | ICD-10-CM | POA: Diagnosis not present

## 2020-09-26 LAB — ECHOCARDIOGRAM COMPLETE
Area-P 1/2: 3.08 cm2
S' Lateral: 3.5 cm

## 2020-10-09 NOTE — Addendum Note (Signed)
Addended by: Campbell Riches on: 10/09/2020 11:16 AM   Modules accepted: Orders

## 2020-11-16 ENCOUNTER — Telehealth: Payer: Self-pay | Admitting: Oncology

## 2020-11-16 NOTE — Telephone Encounter (Signed)
Received a new pt referral from Dr. Marisue Humble for Manuel [name] to establish care with a new oncologist for hx of testicular cancer. Mr. Aragones has previously seen Dr. Beryle Beams for care. He has now been scheduled to see Dr. Alen Blew on 4/5 at 2pm. I asked that the pt have updated records from [facility] faxed to our office. I provided our fax number. Aware to arrive 20 minutes early.

## 2020-11-23 DIAGNOSIS — G43909 Migraine, unspecified, not intractable, without status migrainosus: Secondary | ICD-10-CM | POA: Diagnosis not present

## 2020-11-23 DIAGNOSIS — R519 Headache, unspecified: Secondary | ICD-10-CM | POA: Diagnosis not present

## 2020-11-23 DIAGNOSIS — M545 Low back pain, unspecified: Secondary | ICD-10-CM | POA: Diagnosis not present

## 2020-11-24 DIAGNOSIS — L821 Other seborrheic keratosis: Secondary | ICD-10-CM | POA: Diagnosis not present

## 2020-11-24 DIAGNOSIS — D225 Melanocytic nevi of trunk: Secondary | ICD-10-CM | POA: Diagnosis not present

## 2020-12-05 ENCOUNTER — Inpatient Hospital Stay: Payer: BC Managed Care – PPO | Attending: Oncology | Admitting: Oncology

## 2020-12-05 ENCOUNTER — Other Ambulatory Visit: Payer: Self-pay

## 2020-12-05 VITALS — BP 111/71 | HR 64 | Temp 95.8°F | Resp 17 | Wt 160.6 lb

## 2020-12-05 DIAGNOSIS — C6292 Malignant neoplasm of left testis, unspecified whether descended or undescended: Secondary | ICD-10-CM

## 2020-12-05 NOTE — Progress Notes (Signed)
Reason for the request:    Prostate cancer  HPI: I was asked by Dr. Marisue Humble to evaluate Mr. Bulson for the evaluation of testicular cancer.  He is a 40 year old man diagnosed with left testicular cancer in 2006.  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  He final pathology showed a 3 cm tumor with lymphovascular invasion and negative margins.  The final pathological staging was T2N0.  Imaging studies did not show any evidence of lymphadenopathy and at the borderline elevation of his beta-hCG.   He received adjuvant radiation therapy completed on Jan 02, 2005 after completing total of 25.6 Gray adjuvantly.  He remained on active surveillance under the care of Dr. Beryle Beams still 2014 and did not have any further oncology follow-up.  Most recent chest x-ray in 2019 showed no evidence of malignancy.  He did have MRI of the thoracic spine in 2018 which did not show any evidence of malignancy.  Clinically, he reports a feeling reasonably fair without any major complaints.  He has reported intermittent abdominal and pelvic fullness that was noted for the last few months.  He has had also issues with urination and has been evaluated by urology without any prostate or bladder concerns.  He remains active and continues to work full-time and exercises regularly.  Denies any weight loss or constitutional symptoms.    He does not report any headaches, blurry vision, syncope or seizures. Does not report any fevers, chills or sweats.  Does not report any cough, wheezing or hemoptysis.  Does not report any chest pain, palpitation, orthopnea or leg edema.  Does not report any nausea, vomiting or abdominal pain.  Does not report any constipation or diarrhea.  Does not report any skeletal complaints.    Does not report frequency, urgency or hematuria.  Does not report any skin rashes or lesions. Does not report any heat or cold intolerance.  Does not report any lymphadenopathy or petechiae.   Does not report any anxiety or depression.  Remaining review of systems is negative.    Past Medical History:  Diagnosis Date  . Bilateral flank pain 07/17/2012  . Complication of lumbar puncture 10/05/2015  . Concussion    pt reports he has hx of multiple concussions, last one was 2009  . Headache 10/05/2015  . Hypotension   . Left sided numbness 04/24/2012  . Migraine   . Nausea without vomiting 10/05/2015  . POTS (postural orthostatic tachycardia syndrome)   . Seizure ([place])   . Subacute confusional state 10/05/2015  . Syncope 04/24/2012  . Testicular cancer ([place]) 2006  . 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 Viral meningitis 04/08/2014  . Vision changes 10/05/2015  :  Past Surgical History:  Procedure Laterality Date  . ORCHIECTOMY    . ROTATOR CUFF REPAIR    . SURGERY SCROTAL / TESTICULAR  2006  :  No current outpatient medications on file.:  Allergies  Allergen Reactions  . Contrast Media [Iodinated Diagnostic Agents] Anaphylaxis  . Iodine Anaphylaxis    Other reaction(s): anaphylaxis  . Shellfish Allergy Anaphylaxis  . Codeine Nausea Only    GI upset  . Doxycycline Nausea And Vomiting  . Dilaudid [Hydromorphone Hcl] Other (See Comments)    Nausea  . Nsaids Other (See Comments)  . Tetracyclines & Related   . Erythromycin Hives and Itching  . Prednisone Anxiety    Stomach upset  :  Family History  Problem Relation Age of Onset  . Migraines  Mother   . Diabetes type II Father   :  Social History   Socioeconomic History  . Marital status: Married    Spouse name: [name]  . Number of children: 2  . Years of education: 78  . Highest education level: Not on file  Occupational History  . Not on file  Tobacco Use  . Smoking status: Never Smoker  . Smokeless tobacco: Never Used  Substance and Sexual Activity  . Alcohol use: Yes    Alcohol/week: 1.0 standard drink    Types: 1 Glasses of wine per week    Comment: occainsionally  . Drug use: No  . Sexual activity: Not on  file  Other Topics Concern  . Not on file  Social History Narrative   Lives with wife and kids   Caffeine use: none   Social Determinants of Health   Financial Resource Strain: Not on file  Food Insecurity: Not on file  Transportation Needs: Not on file  Physical Activity: Not on file  Stress: Not on file  Social Connections: Not on file  Intimate Partner Violence: Not on file  :  Pertinent items are noted in HPI.  Exam: Blood pressure 111/71, pulse 64, temperature (!) 95.8 F (35.4 C), temperature source Tympanic, resp. rate 17, weight 160 lb 9.6 oz (72.8 kg), SpO2 100 %.  ECOG 0  General appearance: alert and cooperative appeared without distress. Head: atraumatic without any abnormalities. Eyes: conjunctivae/corneas clear. PERRL.  Sclera anicteric. Throat: lips, mucosa, and tongue normal; without oral thrush or ulcers. Resp: clear to auscultation bilaterally without rhonchi, wheezes or dullness to percussion. Cardio: regular rate and rhythm, S1, S2 normal, no murmur, click, rub or gallop GI: soft, non-tender; bowel sounds normal; no masses,  no organomegaly Skin: Skin color, texture, turgor normal. No rashes or lesions Lymph nodes: Cervical, supraclavicular, and axillary nodes normal. Neurologic: Grossly normal without any motor, sensory or deep tendon reflexes. Musculoskeletal: No joint deformity or effusion.    Assessment and Plan:   40 year old man with:  1.  Stage I seminoma diagnosed in 2006.  He underwent left orchiectomy and found to have T2N0 disease.  He received adjuvant radiation therapy without any evidence of relapsed disease at this time.  The natural course of this disease was reviewed and the risk of relapse was assessed.  It is extremely unlikely that he develops relapsed disease at this time.  Although seminoma has occurred in the past after long period of remission it is unlikely that is the case at this time.  He has had a chest x-ray in 2019 as  well as MRI in 2018.  Salvage treatment options at this time would be systemic chemotherapy that is unlikely that we will be needed.  He would be at risk of developing contralateral testicular cancer which I recommended self-examination.  Risks and benefits of obtaining imaging studies were discussed at this time.  Given his current symptoms of abdominal pain and his previous pressure to radiation, I would favor obtaining imaging studies of the abdomen pelvis to rule out any recurrent or new malignancy.   2.  Survivorship and age-appropriate cancer screening: I recommended continuing appropriate surveillance including annual physical examination, testicular self-examination as well as dietary and exercise recommendations.  3.  Follow-up: Will be as needed in the future unless his scan is abnormal.   60  minutes were dedicated to this visit. The time was spent on reviewing laboratory data, imaging studies, discussing treatment optionsand answering questions regarding future  plan.     A copy of this consult has been forwarded to the requesting physician.

## 2020-12-07 ENCOUNTER — Encounter: Payer: Self-pay | Admitting: Sports Medicine

## 2020-12-07 ENCOUNTER — Ambulatory Visit (INDEPENDENT_AMBULATORY_CARE_PROVIDER_SITE_OTHER): Payer: BC Managed Care – PPO | Admitting: Sports Medicine

## 2020-12-07 ENCOUNTER — Other Ambulatory Visit: Payer: Self-pay

## 2020-12-07 VITALS — BP 124/72 | Ht 70.0 in | Wt 160.0 lb

## 2020-12-07 DIAGNOSIS — G8929 Other chronic pain: Secondary | ICD-10-CM | POA: Diagnosis not present

## 2020-12-07 DIAGNOSIS — M545 Low back pain, unspecified: Secondary | ICD-10-CM | POA: Diagnosis not present

## 2020-12-07 NOTE — Progress Notes (Signed)
   Subjective:    Patient ID: Manuel [name], male    DOB: 06-04-1981, 40 y.o.   MRN: [id number]  HPI chief complaint: Mid to low back pain  Manuel [name] presents today with chronic mid and low back pain.  He was last seen in our office in 2018 with similar complaints.  MRIs of his thoracic and lumbar spine were performed at that time.  He was noted to have a mild disc bulge at L5-S1 but no significant stenosis or nerve root impingement.  This was similar to an MRI that was done in 2017.  Manuel [name] states that all of his pain began after a lumbar puncture for meningitis in 2015.  Since that time he has had chronic low back pain which has worsened over time.  It has really progressed over the past 5 to 6 months.  His pain is worse with bending forward such as with tying his shoes.  He also has difficulty when extending the back such as with serving a tennis ball.  He endorses frequent spasm of the low back.  He has tried physical therapy in the past without any improvement.  Does note that he has some radiating pain down the posterior aspect of his right leg occasionally.  Pain does not radiate into the calf or right foot.  No prior low back surgeries.  He has not tried any medications for his pain.  His cardiologist has recommended against him taking NSAIDs secondary to history of POTTS and tachycardia.  No recent trauma.  Interim medical history reviewed Medications reviewed Allergies reviewed    Review of Systems    As above Objective:   Physical Exam  Well-developed, fit appearing.  No acute distress.    Lumbar spine: Patient has good lumbar range of motion but has pain both with forward flexion and extension.  No tenderness along the midline but there is some spasm of the paraspinal musculature, left greater than right.  No focal neurological deficit grossly of either lower extremity.      Assessment & Plan:   Chronic low back pain with previous MRI evidence of L5-S1 disc bulge without  stenosis  Since Tommy does not have surgical pathology on his MRI, and he has failed conservative treatment thus far including formal physical therapy, I think he would benefit from consultation with physiatry.  He has already consulted with neurology in the past.  I would like to refer him to Dr. Doreatha Martin at [facility].  Patient will follow up with me as needed.

## 2020-12-07 NOTE — Patient Instructions (Signed)
[facility] Dr Doreatha Martin Thursday April 14th @ 330p Arrival time is [place]  [place]

## 2020-12-10 ENCOUNTER — Encounter: Payer: Self-pay | Admitting: Internal Medicine

## 2020-12-10 NOTE — Progress Notes (Signed)
In response to question regarding thoracic radiation in relationship to dysautonomia, there is a relatively small but apparently quite stable literature suggesting numeral 1-radiation risks in general include coronary artery disease, valvular disease, cardiomyopathy and more recently autonomic dysfunction manifested by abnormal heart rate response to exercise, resting tachycardia.  Moreover there is an association in Hodgkin's patients for hypertension which seems to make more problematic the aforementioned risks  Cardiovascular assessment needs then to be directed at evidence of cardiomyopathy, perhaps cMRI, ischemia perhaps CTA, and valvular heart disease with an echo.  It is not clear that management of BP reverses the aforementioned increased risk--a Adan Sis was a Counselling psychologist on one of these papers and is from [place]  I will reach out

## 2020-12-11 ENCOUNTER — Ambulatory Visit (INDEPENDENT_AMBULATORY_CARE_PROVIDER_SITE_OTHER): Payer: BC Managed Care – PPO | Admitting: Internal Medicine

## 2020-12-11 ENCOUNTER — Encounter: Payer: Self-pay | Admitting: Internal Medicine

## 2020-12-11 ENCOUNTER — Other Ambulatory Visit: Payer: Self-pay

## 2020-12-11 VITALS — BP 100/60 | HR 74 | Ht 70.0 in | Wt 159.0 lb

## 2020-12-11 DIAGNOSIS — I498 Other specified cardiac arrhythmias: Secondary | ICD-10-CM | POA: Diagnosis not present

## 2020-12-11 DIAGNOSIS — G90A Postural orthostatic tachycardia syndrome (POTS): Secondary | ICD-10-CM

## 2020-12-11 DIAGNOSIS — I429 Cardiomyopathy, unspecified: Secondary | ICD-10-CM

## 2020-12-11 DIAGNOSIS — I38 Endocarditis, valve unspecified: Secondary | ICD-10-CM

## 2020-12-11 MED ORDER — PROPRANOLOL HCL 20 MG PO TABS: 20.0000 mg | ORAL_TABLET | ORAL | 0 refills | Status: DC | PRN

## 2020-12-11 NOTE — Progress Notes (Signed)
CARDIOLOGY OFFICE  NOTE  Patient ID: Manuel [name], MRN: [id number], DOB/AGE: Oct 18, 1980 40 y.o. Admit date: (Not on file) Date of Consult: 12/11/2020  Primary Physician: Gaynelle Arabian, MD Primary Cardiologist: SK  Manuel [name] is a 40 y.o. male who is being seen today for  re evaluation of POTS .     Chief Complaint:     HPI Manuel [name] is a 40 y.o. male  With hx of testicular cancer associated with abdominal, genital and chest irradiation which predated by about 10 yrs, symptoms of postural and exercise intolerance which appeared relatively abruptly 2017.  When last seen in 2020 he had scant symptoms.  Hydration salt and compressive wear had largely mitigated them.  FAll 2021 recurrence of problems including shower intolerance, difficulties with exercising.  He has had 1 syncopal and 1 presyncopal episode that occurred following showers, associated with a recognizable prodrome of 3 to 5 minutes duration, accompanied by pallor and significant residual fatigue. Associated with high heart rates and low blood pressure.    He also has recently developed recurrence of his migraine headaches.  And in the same context has complaints of light sensitivity made much worse recently by our snow.  Has sought after recumbent exercise tools.  He also plays tennis.  Has significant lightheadedness between points.  Hydrates well but is not particularly salt replete during exercise.  Having problems with urinary retention and constipation.      Past Medical History:  Diagnosis Date  . Bilateral flank pain 07/17/2012  . Complication of lumbar puncture 10/05/2015  . Concussion    pt reports he has hx of multiple concussions, last one was 2009  . Headache 10/05/2015  . Hypotension   . Left sided numbness 04/24/2012  . Migraine   . Nausea without vomiting 10/05/2015  . POTS (postural orthostatic tachycardia syndrome)   . Seizure ([place])   . Subacute confusional state  10/05/2015  . Syncope 04/24/2012  . Testicular cancer ([place]) 2006  . 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 Viral meningitis 04/08/2014  . Vision changes 10/05/2015      Surgical History:  Past Surgical History:  Procedure Laterality Date  . ORCHIECTOMY    . ROTATOR CUFF REPAIR    . SURGERY SCROTAL / TESTICULAR  2006     Home Meds: Prior to Admission medications   Not on File     Allergies:  Allergies  Allergen Reactions  . Contrast Media [Iodinated Diagnostic Agents] Anaphylaxis  . Iodine Anaphylaxis    Other reaction(s): anaphylaxis  . Shellfish Allergy Anaphylaxis  . Codeine Nausea Only    GI upset  . Doxycycline Nausea And Vomiting  . Dilaudid [Hydromorphone Hcl] Other (See Comments)    Nausea  . Nsaids Other (See Comments)  . Tetracyclines & Related   . Erythromycin Hives and Itching  . Prednisone Anxiety    Stomach upset      ROS:  Please see the history of present illness.     All other systems reviewed and negative.  BP 100/60 (BP Location: Left Arm, Patient Position: Sitting, Cuff Size: Normal)   Pulse 74   Ht 5\' 10"  (1.778 m)   Wt 159 lb (72.1 kg)   SpO2 96%   BMI 22.81 kg/m   Well developed and nourished in no acute distress HENT normal Neck supple with JVP-  flat   Clear Regular rate and rhythm, no murmurs or gallops Abd-soft with active BS No  Clubbing cyanosis edema Skin-warm and dry A & Oriented  Grossly normal sensory and motor function      Assessment and Plan:  POTS in the past  Shower intolerance  Syncope/presyncope  Testicular cancer  Chest abdomen and perineal radiation  Patient continues with shower and post exercise intolerance.  It seems to be associated with a hyperadrenergic drive, especially the former.  We are going to try low-dose beta-blocker starting the propranolol 10 mg increasing up to 40 mg if his blood pressure tolerates it (blood pressure has been in the 110-115 range after showers)  Have discussed salt repletion formulas  including Pedialyte advance care, trioral, liquid IV etc. in addition, have recommended he try thigh compression while he plays tennis.  Have reviewed with him some of the reading that I have recently been doing and having sought to reach out to Dr. Adan Sis at [facility] who seems to be heavily involved in radiation consequences of cardiovascular system.  I wonder also whether his perineal radiation has affected his bladder and bowel control.  Cardiovascular injury from radiation potentially include valvular disease, premature coronary disease as well as cardiomyopathy and echocardiogram, CT imaging and cMRI are recommended.      Virl Axe

## 2020-12-11 NOTE — Telephone Encounter (Signed)
Pt is scheduled to see Dr Caryl Comes 12/11/2020 in office.

## 2020-12-11 NOTE — Patient Instructions (Addendum)
Medication Instructions:  Your physician has recommended you make the following change in your medication:   ** Begin Propranolol 20mg  - as needed prior to showering. And as discussed by Dr Caryl Comes *If you need a refill on your cardiac medications before your next appointment, please call your pharmacy*   Lab Work:  If you have labs (blood work) drawn today and your tests are completely normal, you will receive your results only by: Marland Kitchen MyChart Message (if you have MyChart) OR . A paper copy in the mail If you have any lab test that is abnormal or we need to change your treatment, we will call you to review the results.   Testing/Procedures:  Your physician has requested that you have a cardiac MRI. Cardiac MRI uses a computer to create images of your heart as its beating, producing both still and moving pictures of your heart and major blood vessels. For further information please visit [email or web address] Please follow the instruction sheet given to you today for more information.     Follow-Up: At [facility], you and your health needs are our priority.  As part of our continuing mission to provide you with exceptional heart care, we have created designated Provider Care Teams.  These Care Teams include your primary Cardiologist (physician) and Advanced Practice Providers (APPs -  Physician Assistants and Nurse Practitioners) who all work together to provide you with the care you need, when you need it.  We recommend signing up for the patient portal called "MyChart".  Sign up information is provided on this After Visit Summary.  MyChart is used to connect with patients for Virtual Visits (Telemedicine).  Patients are able to view lab/test results, encounter notes, upcoming appointments, etc.  Non-urgent messages can be sent to your provider as well.   To learn more about what you can do with MyChart, go to [email or web address].    Your next appointment:   To be determined

## 2020-12-12 ENCOUNTER — Other Ambulatory Visit: Payer: Self-pay | Admitting: Oncology

## 2020-12-12 ENCOUNTER — Telehealth: Payer: Self-pay | Admitting: Internal Medicine

## 2020-12-12 NOTE — Telephone Encounter (Signed)
Left message for patient to call and discuss scheduling the Cardiac MRI ordered by Dr. Caryl Comes

## 2020-12-13 NOTE — Telephone Encounter (Signed)
Left message for patient to call and discuss preferred weekdays and times for scheduling the Cardiac MRI ordered by Dr. Caryl Comes

## 2020-12-14 ENCOUNTER — Telehealth: Payer: Self-pay

## 2020-12-14 NOTE — Telephone Encounter (Signed)
Referral faxed to [facility] to schedule pt with Dr. Brien Few.

## 2020-12-18 NOTE — Telephone Encounter (Signed)
Spoke with patient regarding preferred weekdays and times for scheduling the Cardiac MRI ordered by Dr. Yetta Flock states he has received a My Chart message from Dr. Caryl Comes stating he would like the patient to see Dr. Annabell Sabal at [facility].  Patient to reach out to Dr.Klein to see if he needs his MRI prior to the visit at [facility] or if Dr. Caryl Comes will wants to wait until after the appointment with Dr. Annabell Sabal.  Patient will let me know how to proceed as soon as h he hears from [place].

## 2021-01-08 DIAGNOSIS — G8929 Other chronic pain: Secondary | ICD-10-CM | POA: Diagnosis not present

## 2021-01-08 DIAGNOSIS — M5126 Other intervertebral disc displacement, lumbar region: Secondary | ICD-10-CM | POA: Diagnosis not present

## 2021-01-08 DIAGNOSIS — M546 Pain in thoracic spine: Secondary | ICD-10-CM | POA: Diagnosis not present

## 2021-01-08 DIAGNOSIS — R200 Anesthesia of skin: Secondary | ICD-10-CM | POA: Diagnosis not present

## 2021-01-09 ENCOUNTER — Other Ambulatory Visit: Payer: Self-pay | Admitting: Physical Medicine and Rehabilitation

## 2021-01-09 DIAGNOSIS — G8929 Other chronic pain: Secondary | ICD-10-CM

## 2021-01-15 ENCOUNTER — Encounter (HOSPITAL_BASED_OUTPATIENT_CLINIC_OR_DEPARTMENT_OTHER): Payer: Self-pay | Admitting: Emergency Medicine

## 2021-01-15 ENCOUNTER — Emergency Department (HOSPITAL_BASED_OUTPATIENT_CLINIC_OR_DEPARTMENT_OTHER): Payer: BC Managed Care – PPO

## 2021-01-15 ENCOUNTER — Emergency Department (HOSPITAL_BASED_OUTPATIENT_CLINIC_OR_DEPARTMENT_OTHER)
Admission: EM | Admit: 2021-01-15 | Discharge: 2021-01-15 | Disposition: A | Discharge status: Home / Self Care | Payer: BC Managed Care – PPO | Attending: Emergency Medicine | Admitting: Emergency Medicine

## 2021-01-15 ENCOUNTER — Other Ambulatory Visit: Payer: Self-pay

## 2021-01-15 DIAGNOSIS — R0602 Shortness of breath: Secondary | ICD-10-CM | POA: Diagnosis not present

## 2021-01-15 DIAGNOSIS — U071 COVID-19: Secondary | ICD-10-CM | POA: Diagnosis not present

## 2021-01-15 DIAGNOSIS — Z28310 Unvaccinated for covid-19: Secondary | ICD-10-CM | POA: Insufficient documentation

## 2021-01-15 DIAGNOSIS — Z8547 Personal history of malignant neoplasm of testis: Secondary | ICD-10-CM | POA: Insufficient documentation

## 2021-01-15 DIAGNOSIS — R059 Cough, unspecified: Secondary | ICD-10-CM | POA: Diagnosis not present

## 2021-01-15 DIAGNOSIS — R0789 Other chest pain: Secondary | ICD-10-CM | POA: Diagnosis not present

## 2021-01-15 LAB — CBC WITH DIFFERENTIAL/PLATELET
Abs Immature Granulocytes: 0 10*3/uL (ref 0.00–0.07)
Basophils Absolute: 0 10*3/uL (ref 0.0–0.1)
Basophils Relative: 0 %
Eosinophils Absolute: 0 10*3/uL (ref 0.0–0.5)
Eosinophils Relative: 1 %
HCT: 43.3 % (ref 39.0–52.0)
Hemoglobin: 14.2 g/dL (ref 13.0–17.0)
Immature Granulocytes: 0 %
Lymphocytes Relative: 27 %
Lymphs Abs: 1.3 10*3/uL (ref 0.7–4.0)
MCH: 29.3 pg (ref 26.0–34.0)
MCHC: 32.8 g/dL (ref 30.0–36.0)
MCV: 89.3 fL (ref 80.0–100.0)
Monocytes Absolute: 0.5 10*3/uL (ref 0.1–1.0)
Monocytes Relative: 11 %
Neutro Abs: 2.9 10*3/uL (ref 1.7–7.7)
Neutrophils Relative %: 61 %
Platelets: 199 10*3/uL (ref 150–400)
RBC: 4.85 MIL/uL (ref 4.22–5.81)
RDW: 12.6 % (ref 11.5–15.5)
WBC: 4.8 10*3/uL (ref 4.0–10.5)
nRBC: 0 % (ref 0.0–0.2)

## 2021-01-15 LAB — BASIC METABOLIC PANEL
Anion gap: 10 (ref 5–15)
BUN: 14 mg/dL (ref 6–20)
CO2: 26 mmol/L (ref 22–32)
Calcium: 9.3 mg/dL (ref 8.9–10.3)
Chloride: 97 mmol/L — ABNORMAL LOW (ref 98–111)
Creatinine, Ser: 1.02 mg/dL (ref 0.61–1.24)
GFR, Estimated: >60 mL/min (ref >60)
Glucose, Bld: 164 mg/dL — ABNORMAL HIGH (ref 70–99)
Potassium: 3.7 mmol/L (ref 3.5–5.1)
Sodium: 133 mmol/L — ABNORMAL LOW (ref 135–145)

## 2021-01-15 LAB — TROPONIN I (HIGH SENSITIVITY): Troponin I (High Sensitivity): 3 ng/L (ref <18)

## 2021-01-15 LAB — D-DIMER, QUANTITATIVE: D-Dimer, Quant: 0.37 ug/mL-FEU (ref 0.00–0.50)

## 2021-01-15 MED ORDER — GUAIFENESIN 100 MG/5ML PO SOLN
10.0000 mL | ORAL | Status: DC | PRN
  Filled 2021-01-15: qty 10

## 2021-01-15 MED ORDER — BENZONATATE 100 MG PO CAPS
100.0000 mg | ORAL_CAPSULE | Freq: Once | ORAL | Status: DC
  Filled 2021-01-15: qty 1

## 2021-01-15 MED ORDER — ONDANSETRON 4 MG PO TBDP: 4.0000 mg | ORAL_TABLET | Freq: Three times a day (TID) | ORAL | 0 refills | Status: DC | PRN

## 2021-01-15 MED ORDER — GUAIFENESIN ER 600 MG PO TB12: 1200.0000 mg | ORAL_TABLET | Freq: Two times a day (BID) | ORAL | Status: DC

## 2021-01-15 MED ORDER — ALBUTEROL SULFATE HFA 108 (90 BASE) MCG/ACT IN AERS
2.0000 | INHALATION_SPRAY | Freq: Once | RESPIRATORY_TRACT | Status: DC
  Filled 2021-01-15: qty 6.7

## 2021-01-15 NOTE — Discharge Instructions (Signed)
Your blood tests today were reassuring.  You likely have a viral bronchitis, and a small amount of blood in your sputum from coughing.  You can try over-the-counter cough medications to help with this.  Most people suffer from COVID symptoms for 7 to 10 days.  Continue taking the paxlovid at home.  Continue drinking water and making an effort to eat every day.  *  Listed below are the current CDC isolation guidelines for non-immunocompromised patients with mild to moderate Covid symptoms.  These guidelines apply to this current patient.  This patient was diagnosed with Covid-19.  They should isolate at home until either one of the following:  1.) Five full days from the onset of their symptoms.  They can then return to work or school if they are fever-free for at least 24 hours without use of fever-reducing medications, and if their symptoms are improving.  If they return to work or school, they should avoid close contact with other people (e.g. sharing items, touching, working around food), and wear a well-fitted mask (such as an N-95 or KN-95) until they've reached the 10th day from their symptom onset.  Or  2.)  Ten full days from the onset of their symptoms.  They can then return to work or school normally.  CDC Quarantine & Isolation Guidelines:  https://brown.org/.html

## 2021-01-15 NOTE — ED Provider Notes (Signed)
[place] [facility] EMERGENCY DEPT Provider Note   CSN: [id number] Arrival date & time: 01/15/21  1012     History Chief Complaint  Patient presents with  . Shortness of Breath    Manuel [name] is a 40 y.o. male with a history of testicular cancer (not on chemo or radiation), seizure, presenting to the emergency department with cough, congestion, hemoptysis.  The patient reports that he began having symptoms 2 to 3 days ago.  He tested positive for COVID on the home test.  He says his entire family is also tested positive and is symptomatic.  He has not had any of the COVID vaccines.  He reports fatigue, cough, congestion, headache, myalgias.  He reports he has had significant cough, worsening in the past 12 hours.  He noted he was bringing up some rust colored sputum that had some flecks of blood in it after heavy coughing fits.  He describes substernal chest pressure that developed after his coughing fits.  He has not taken any over-the-counter medications aside from Tylenol at home.  He has an extensive list of allergies and medication side effects, including NSAIDs (told to avoid by PCP for heart condition), iodine (anaphylaxis), steroids (mania/hyperactivity).  His PCP prescribed Paxlovid, which he began taking that yesterday.  He is now on day 2.  He does not take any other medications otherwise, including beta blockers.  No hemoptysis or asymmetric LE edema. Patient denies personal or family history of DVT or PE. No recent hormone use (including OCP); travel for >6 hours; prolonged immobilization for greater than 3 days; surgeries or trauma in the last 4 weeks.   HPI     Past Medical History:  Diagnosis Date  . Bilateral flank pain 07/17/2012  . Complication of lumbar puncture 10/05/2015  . Concussion    pt reports he has hx of multiple concussions, last one was 2009  . Headache 10/05/2015  . Hypotension   . Left sided numbness 04/24/2012  . Migraine   . Nausea  without vomiting 10/05/2015  . POTS (postural orthostatic tachycardia syndrome)   . Seizure ([place])   . Subacute confusional state 10/05/2015  . Syncope 04/24/2012  . Testicular cancer ([place]) 2006  . 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 Viral meningitis 04/08/2014  . Vision changes 10/05/2015    Patient Active Problem List   Diagnosis Date Noted  . POTS (postural orthostatic tachycardia syndrome) 09/19/2020  . Migraine 10/05/2015  . Subacute confusional state 10/05/2015  . Headache 10/05/2015  . Vision changes 10/05/2015  . Nausea without vomiting 10/05/2015  . Positional headache 10/05/2015  . Complication of lumbar puncture 10/05/2015  . 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 Viral meningitis 04/08/2014  . Testicular cancer ([place]) 07/17/2012  . Bilateral flank pain 07/17/2012  . Left sided numbness 04/24/2012  . Syncope 04/24/2012    Past Surgical History:  Procedure Laterality Date  . ORCHIECTOMY    . ROTATOR CUFF REPAIR    . SURGERY SCROTAL / TESTICULAR  2006       Family History  Problem Relation Age of Onset  . Migraines Mother   . Diabetes type II Father     Social History   Tobacco Use  . Smoking status: Never Smoker  . Smokeless tobacco: Never Used  Substance Use Topics  . Alcohol use: Not Currently  . Drug use: No    Home Medications Prior to Admission medications   Medication Sig Start Date End Date Taking? Authorizing Provider  ondansetron [facility]  ODT) 4 MG disintegrating tablet Take 1 tablet (4 mg total) by mouth every 8 (eight) hours as needed for up to 15 doses for nausea or vomiting. 01/15/21  Yes Shawny Borkowski, Carola Rhine, MD  propranolol (INDERAL) 20 MG tablet Take 1 tablet (20 mg total) by mouth as needed (as needed before showering). 12/11/20   Deboraha Sprang, MD    Allergies    Contrast media [iodinated diagnostic agents], Iodine, Shellfish allergy, Codeine, Doxycycline, Dilaudid [hydromorphone hcl], Nsaids, Tetracyclines & related, Erythromycin, and  Prednisone  Review of Systems   Review of Systems  Constitutional: Positive for appetite change, chills, fatigue and fever.  HENT: Positive for congestion. Negative for sore throat.   Eyes: Negative for pain and visual disturbance.  Respiratory: Positive for cough, chest tightness and shortness of breath.   Cardiovascular: Positive for chest pain. Negative for palpitations and leg swelling.  Gastrointestinal: Positive for nausea. Negative for abdominal pain.  Genitourinary: Negative for dysuria and hematuria.  Musculoskeletal: Positive for arthralgias and myalgias. Negative for back pain.  Skin: Negative for color change and rash.  Neurological: Positive for headaches. Negative for syncope.  All other systems reviewed and are negative.   Physical Exam Updated Vital Signs BP 112/78   Pulse 64   Temp 98.4 F (36.9 C) (Oral)   Resp 17   Ht 5\' 10"  (1.778 m)   Wt 72.6 kg   SpO2 99%   BMI 22.96 kg/m   Physical Exam Constitutional:      General: He is not in acute distress. HENT:     Head: Normocephalic and atraumatic.  Eyes:     Conjunctiva/sclera: Conjunctivae normal.     Pupils: Pupils are equal, round, and reactive to light.  Cardiovascular:     Rate and Rhythm: Normal rate and regular rhythm.  Pulmonary:     Effort: Pulmonary effort is normal. No respiratory distress.     Breath sounds: Normal breath sounds.     Comments: 96% on room air Abdominal:     General: There is no distension.     Tenderness: There is no abdominal tenderness.  Skin:    General: Skin is warm and dry.  Neurological:     General: No focal deficit present.     Mental Status: He is alert and oriented to person, place, and time. Mental status is at baseline.  Psychiatric:        Mood and Affect: Mood normal.        Behavior: Behavior normal.     ED Results / Procedures / Treatments   Labs (all labs ordered are listed, but only abnormal results are displayed) Labs Reviewed  BASIC METABOLIC  PANEL - Abnormal; Notable for the following components:      Result Value   Sodium 133 (*)    Chloride 97 (*)    Glucose, Bld 164 (*)    All other components within normal limits  CBC WITH DIFFERENTIAL/PLATELET  D-DIMER, QUANTITATIVE (NOT AT [facility])  TROPONIN I (HIGH SENSITIVITY)    EKG EKG Interpretation  Date/Time:  Monday Jan 15 2021 10:27:23 EDT Ventricular Rate:  71 PR Interval:  132 QRS Duration: 95 QT Interval:  392 QTC Calculation: 426 R Axis:   78 Text Interpretation: Sinus rhythm No STEMI Confirmed by Octaviano Glow [phone]) on 01/15/2021 10:34:27 AM   Radiology DG Chest Portable 1 View  Result Date: 01/15/2021 CLINICAL DATA:  Shortness of breath, cough. EXAM: PORTABLE CHEST 1 VIEW COMPARISON:  November 28, 2017. FINDINGS: The  heart size and mediastinal contours are within normal limits. Both lungs are clear. The visualized skeletal structures are unremarkable. IMPRESSION: No active disease. Electronically Signed   By: Marijo Conception M.D.   On: 01/15/2021 11:39    Procedures Procedures   Medications Ordered in ED Medications - No data to display  ED Course  I have reviewed the triage vital signs and the nursing notes.  Pertinent labs & imaging results that were available during my care of the patient were reviewed by me and considered in my medical decision making (see chart for details).  40 year old here with viral type syndrome, day 3, with home positive COVID test.  Multiple family members also sick with [place].  He is not vaccinated for COVID, but has begun taking Paxlovid yesterday.  He does not have any tachycardia or hypoxia on exam.  He does appear fatigued.  We will obtain electrolyte levels, xray chest.  We discussed his risk factors for PE - although his cancer history is distant (> 15 years since treatment).  We'll check a Ddimer level here.  His small sputum- hemoptysis more likely due to persistent bronchitis and bronchial irritation due to coughing.   Chest pain and pressure likewise began with coughing and likely due to airway irritation.  Labs reviewed showing no significant electrolyte abnormalities, hgb normal, WBC 4.8  Trop 3.  Ddimer 0.37  ECG personally reviewed showing normal sinus rhythm with no acute ischemic findings.  I have a low suspicion at this time for acute coronary syndrome or pericarditis.  DG chest ordered and reviewed - no evident PTX or bacterial PNA.   Manuel [name] was evaluated in Emergency Department on 01/15/2021 for the symptoms described in the history of present illness. He was evaluated in the context of the global COVID-19 pandemic, which necessitated consideration that the patient might be at risk for infection with the SARS-CoV-2 virus that causes COVID-19. Institutional protocols and algorithms that pertain to the evaluation of patients at risk for COVID-19 are in a state of rapid change based on information released by regulatory bodies including the CDC and federal and state organizations. These policies and algorithms were followed during the patient's care in the ED.   Clinical Course as of 01/15/21 1732  Mon Jan 15, 2021  1337 D-dimer is negative.  Patient was reassessed and feels that he subjectively breathing better.  No hypoxia during his stay.  This point have a low suspicion for PE, pericarditis, ACS.  He is okay for discharge.  He was not given any of the p.o. medications for cough in the ED, but does not want any here. [MT]    Clinical Course User Index [MT] Delfino Friesen, Carola Rhine, MD    Final Clinical Impression(s) / ED Diagnoses Final diagnoses:  [id number]    Rx / DC Orders ED Discharge Orders         Ordered    ondansetron (ZOFRAN ODT) 4 MG disintegrating tablet  Every 8 hours PRN        01/15/21 1334           Wyvonnia Dusky, MD 01/15/21 1732

## 2021-01-15 NOTE — ED Triage Notes (Signed)
Pt also adds that he had had fever and took tylenol before coming in

## 2021-01-15 NOTE — ED Triage Notes (Signed)
Pt tested positive on Saturday for Covid with a home test. Pt has been experiencing constant Chest Pain, SOB and a cough with blood.

## 2021-01-15 NOTE — ED Notes (Signed)
Patient declined inhaler at this time. He has taken this in the past and had an increase in his HR. Requesting to wait for CXR. Patient encouraged to let staff know if he changes his mind. MD and RN made aware. Patient in no distress at this time.

## 2021-02-10 ENCOUNTER — Ambulatory Visit
Admission: RE | Admit: 2021-02-10 | Discharge: 2021-02-10 | Disposition: A | Discharge status: Home / Self Care | Payer: BC Managed Care – PPO | Source: Ambulatory Visit | Attending: Physical Medicine and Rehabilitation | Admitting: Physical Medicine and Rehabilitation

## 2021-02-10 ENCOUNTER — Other Ambulatory Visit: Payer: Self-pay | Admitting: Physical Medicine and Rehabilitation

## 2021-02-10 ENCOUNTER — Other Ambulatory Visit: Payer: Self-pay

## 2021-02-10 DIAGNOSIS — M545 Low back pain, unspecified: Secondary | ICD-10-CM | POA: Diagnosis not present

## 2021-02-10 DIAGNOSIS — M546 Pain in thoracic spine: Secondary | ICD-10-CM

## 2021-02-10 DIAGNOSIS — D1800 Hemangioma unspecified site: Secondary | ICD-10-CM | POA: Diagnosis not present

## 2021-02-10 DIAGNOSIS — G8929 Other chronic pain: Secondary | ICD-10-CM

## 2021-02-20 DIAGNOSIS — M5126 Other intervertebral disc displacement, lumbar region: Secondary | ICD-10-CM | POA: Diagnosis not present

## 2021-02-20 DIAGNOSIS — M546 Pain in thoracic spine: Secondary | ICD-10-CM | POA: Diagnosis not present

## 2021-03-07 DIAGNOSIS — M5126 Other intervertebral disc displacement, lumbar region: Secondary | ICD-10-CM | POA: Diagnosis not present

## 2021-03-16 DIAGNOSIS — E538 Deficiency of other specified B group vitamins: Secondary | ICD-10-CM | POA: Diagnosis not present

## 2021-03-16 DIAGNOSIS — R5383 Other fatigue: Secondary | ICD-10-CM | POA: Diagnosis not present

## 2021-03-16 DIAGNOSIS — G43909 Migraine, unspecified, not intractable, without status migrainosus: Secondary | ICD-10-CM | POA: Diagnosis not present

## 2021-03-16 DIAGNOSIS — Z131 Encounter for screening for diabetes mellitus: Secondary | ICD-10-CM | POA: Diagnosis not present

## 2021-03-16 DIAGNOSIS — I498 Other specified cardiac arrhythmias: Secondary | ICD-10-CM | POA: Diagnosis not present

## 2021-03-16 DIAGNOSIS — Z1329 Encounter for screening for other suspected endocrine disorder: Secondary | ICD-10-CM | POA: Diagnosis not present

## 2021-03-16 DIAGNOSIS — E559 Vitamin D deficiency, unspecified: Secondary | ICD-10-CM | POA: Diagnosis not present

## 2021-03-16 DIAGNOSIS — G4700 Insomnia, unspecified: Secondary | ICD-10-CM | POA: Diagnosis not present

## 2021-04-02 DIAGNOSIS — M5126 Other intervertebral disc displacement, lumbar region: Secondary | ICD-10-CM | POA: Diagnosis not present

## 2021-04-05 DIAGNOSIS — Z1589 Genetic susceptibility to other disease: Secondary | ICD-10-CM | POA: Diagnosis not present

## 2021-04-05 DIAGNOSIS — R5383 Other fatigue: Secondary | ICD-10-CM | POA: Diagnosis not present

## 2021-04-05 DIAGNOSIS — B2790 Infectious mononucleosis, unspecified without complication: Secondary | ICD-10-CM | POA: Diagnosis not present

## 2021-04-05 DIAGNOSIS — I498 Other specified cardiac arrhythmias: Secondary | ICD-10-CM | POA: Diagnosis not present

## 2021-07-12 DIAGNOSIS — E559 Vitamin D deficiency, unspecified: Secondary | ICD-10-CM | POA: Diagnosis not present

## 2021-07-12 DIAGNOSIS — E538 Deficiency of other specified B group vitamins: Secondary | ICD-10-CM | POA: Diagnosis not present

## 2021-07-12 DIAGNOSIS — K5900 Constipation, unspecified: Secondary | ICD-10-CM | POA: Diagnosis not present

## 2021-07-12 DIAGNOSIS — G90A Postural orthostatic tachycardia syndrome (POTS): Secondary | ICD-10-CM | POA: Diagnosis not present

## 2021-07-12 DIAGNOSIS — M545 Low back pain, unspecified: Secondary | ICD-10-CM | POA: Diagnosis not present

## 2021-07-12 DIAGNOSIS — D8989 Other specified disorders involving the immune mechanism, not elsewhere classified: Secondary | ICD-10-CM | POA: Diagnosis not present

## 2021-07-12 DIAGNOSIS — R5383 Other fatigue: Secondary | ICD-10-CM | POA: Diagnosis not present

## 2021-07-19 ENCOUNTER — Other Ambulatory Visit: Payer: Self-pay | Admitting: Family Medicine

## 2021-07-19 ENCOUNTER — Ambulatory Visit
Admission: RE | Admit: 2021-07-19 | Discharge: 2021-07-19 | Disposition: A | Discharge status: Home / Self Care | Payer: BC Managed Care – PPO | Source: Ambulatory Visit | Attending: Family Medicine | Admitting: Family Medicine

## 2021-07-19 DIAGNOSIS — R109 Unspecified abdominal pain: Secondary | ICD-10-CM

## 2021-07-19 DIAGNOSIS — K30 Functional dyspepsia: Secondary | ICD-10-CM | POA: Diagnosis not present

## 2021-07-19 DIAGNOSIS — K5900 Constipation, unspecified: Secondary | ICD-10-CM | POA: Diagnosis not present

## 2021-07-19 DIAGNOSIS — Z8547 Personal history of malignant neoplasm of testis: Secondary | ICD-10-CM | POA: Diagnosis not present

## 2021-07-31 ENCOUNTER — Encounter: Payer: Self-pay | Admitting: Internal Medicine

## 2021-08-11 ENCOUNTER — Other Ambulatory Visit: Payer: Self-pay | Admitting: Family Medicine

## 2021-08-11 DIAGNOSIS — R109 Unspecified abdominal pain: Secondary | ICD-10-CM

## 2021-08-11 DIAGNOSIS — Z8547 Personal history of malignant neoplasm of testis: Secondary | ICD-10-CM

## 2021-08-11 DIAGNOSIS — K5900 Constipation, unspecified: Secondary | ICD-10-CM

## 2021-08-16 ENCOUNTER — Ambulatory Visit
Admission: RE | Admit: 2021-08-16 | Discharge: 2021-08-16 | Disposition: A | Discharge status: Home / Self Care | Payer: BC Managed Care – PPO | Source: Ambulatory Visit | Attending: Family Medicine | Admitting: Family Medicine

## 2021-08-16 DIAGNOSIS — K5900 Constipation, unspecified: Secondary | ICD-10-CM

## 2021-08-16 DIAGNOSIS — Z8547 Personal history of malignant neoplasm of testis: Secondary | ICD-10-CM

## 2021-08-16 DIAGNOSIS — R109 Unspecified abdominal pain: Secondary | ICD-10-CM

## 2021-08-28 DIAGNOSIS — J019 Acute sinusitis, unspecified: Secondary | ICD-10-CM | POA: Diagnosis not present

## 2021-08-28 DIAGNOSIS — J40 Bronchitis, not specified as acute or chronic: Secondary | ICD-10-CM | POA: Diagnosis not present

## 2021-08-28 DIAGNOSIS — R059 Cough, unspecified: Secondary | ICD-10-CM | POA: Diagnosis not present

## 2021-09-13 DIAGNOSIS — K5900 Constipation, unspecified: Secondary | ICD-10-CM | POA: Diagnosis not present

## 2021-09-13 DIAGNOSIS — R1013 Epigastric pain: Secondary | ICD-10-CM | POA: Diagnosis not present

## 2021-10-30 ENCOUNTER — Telehealth: Payer: Self-pay | Admitting: Internal Medicine

## 2021-10-30 NOTE — Telephone Encounter (Signed)
Pt c/o Syncope: STAT if syncope occurred within 30 minutes and pt complains of lightheadedness High Priority if episode of passing out, completely, today or in last 24 hours   Did you pass out today? no  When is the last time you passed out? Patient has not actually passed out. He just gets close  Has this occurred multiple times? yes  Did you have any symptoms prior to passing out? Daily Migraine headaches, Eye sensitivity to bright lights, no strength to push his legs up  Patient said he has syncopal episodes to where he can not do any type of activity. He said he can not stand up without feeling as if he is going to pass out. He has to lay down to get relief

## 2021-10-30 NOTE — Telephone Encounter (Signed)
Spoke with pt who complains of increasing presyncopal episodes since last Friday.  Pt states he has not actually passed out but feels as if he is.  He states his hands and fingers are extremely cold, he has developed a cough as well. He denies fever and states current temp is 98.5.  He tested for Covid this past weekend and test was negative.  He also reports daily migraines where he develops a "woozie, confused" feeling.  He states he is well hydrated, using calf and salt supplementation.  BP this morning is 143/65 with HR of 72.  He does report low BP at times in the 90's/50's.   Pt last saw Dr Caryl Comes 04/22 and was to have an echo and cMRI which he canceled.  Pt also states he did not schedule an appointment with Dr Annabell Sabal at [facility] as recommended by Dr Caryl Comes.  Will forward to Dr Caryl Comes for review and recommendation.  Pt to continue current therapies for now.  Reviewed ED precautions.  Pt verbalizes understanding and agrees with current plan.

## 2021-11-07 ENCOUNTER — Telehealth: Payer: Self-pay | Admitting: Internal Medicine

## 2021-11-07 DIAGNOSIS — I429 Cardiomyopathy, unspecified: Secondary | ICD-10-CM

## 2021-11-07 DIAGNOSIS — R55 Syncope and collapse: Secondary | ICD-10-CM

## 2021-11-07 NOTE — Telephone Encounter (Signed)
I do not see wife on DPR.  I spoke with patient who gave permission to talk with his wife if needed.  ?Patient reports he was referred to Dr Annabell Sabal at [facility] last year.  Patient reports he started feeling better and did not follow up on this.  ?He would now like to proceed with referral and is asking if Dr Caryl Comes would refer him.  Also asking if he needs cardiac MRI at this time and when he should see Dr Caryl Comes again ?

## 2021-11-07 NOTE — Telephone Encounter (Signed)
? ?  Pt's wife calling to follow up. She said pt supposed to ger a MRI, referral to [facility] for Dr. Marlou Sa and also she wanted to know when should the pt f/u with Dr. Caryl Comes ?

## 2021-11-09 NOTE — Telephone Encounter (Signed)
Spoke with pt and advised per Dr Caryl Comes he recommends pt complete orthostatic vitals and send those readings through [facility].  Pt would like to proceed with with cMRI and appt with Dr Caryl Comes while waiting for appointment with Dr Annabell Sabal.  Order placed for cMRI and CBC. ?

## 2021-11-09 NOTE — Telephone Encounter (Signed)
This encounter has been handled in another encounter dated 11/07/2021.  Please see that encounter for complete details. ?

## 2021-11-09 NOTE — Telephone Encounter (Signed)
Attempted phone call to pt and left voicemail for pt to contact RN at [phone]. ?

## 2021-11-14 ENCOUNTER — Telehealth: Payer: Self-pay | Admitting: Internal Medicine

## 2021-11-14 NOTE — Telephone Encounter (Signed)
Patient called about his MRI.  He states he needs to be referred to somewhere that has an open MRI, he stated that [facility] has one.   ?

## 2021-11-15 DIAGNOSIS — R5383 Other fatigue: Secondary | ICD-10-CM | POA: Diagnosis not present

## 2021-11-15 DIAGNOSIS — R109 Unspecified abdominal pain: Secondary | ICD-10-CM | POA: Diagnosis not present

## 2021-11-15 DIAGNOSIS — E559 Vitamin D deficiency, unspecified: Secondary | ICD-10-CM | POA: Diagnosis not present

## 2021-11-15 NOTE — Telephone Encounter (Signed)
Spoke with pt and advised for cardiac MRI it would be a closed machine.  Pt states he will contact scheduling and let them know he is willing to proceed with appointment.  Pt thanked Therapist, sports for the callback. ?

## 2022-01-02 ENCOUNTER — Ambulatory Visit: Payer: BC Managed Care – PPO | Admitting: Internal Medicine

## 2022-01-03 ENCOUNTER — Telehealth (HOSPITAL_COMMUNITY): Payer: Self-pay | Admitting: Emergency Medicine

## 2022-01-03 NOTE — Telephone Encounter (Signed)
Reaching out to patient to offer assistance regarding upcoming cardiac imaging study; pt verbalizes understanding of appt date/time, parking situation and where to check in, pre-test NPO status and medications ordered, and verified current allergies; name and call back number provided for further questions should they arise ?Marchia Bond RN Navigator Cardiac Imaging ?[facility] ?[phone] office ?[phone] cell ? ?[place] but refusing meds ?Denies iv issues ?Denies metal implants ?Arrival 730 ? ?

## 2022-01-04 ENCOUNTER — Ambulatory Visit (HOSPITAL_COMMUNITY)
Admission: RE | Admit: 2022-01-04 | Discharge: 2022-01-04 | Disposition: A | Discharge status: Home / Self Care | Payer: BC Managed Care – PPO | Source: Ambulatory Visit | Attending: Internal Medicine | Admitting: Internal Medicine

## 2022-01-04 ENCOUNTER — Encounter (HOSPITAL_COMMUNITY): Payer: Self-pay

## 2022-01-04 DIAGNOSIS — I429 Cardiomyopathy, unspecified: Secondary | ICD-10-CM

## 2022-01-04 DIAGNOSIS — R55 Syncope and collapse: Secondary | ICD-10-CM

## 2022-01-04 NOTE — Progress Notes (Signed)
Pt attempted to have MRI today.  He was claustrophobic and unable to stay in scanner.  Pt did not wish to have any sedatin meds at this time. ?

## 2022-01-23 ENCOUNTER — Ambulatory Visit (INDEPENDENT_AMBULATORY_CARE_PROVIDER_SITE_OTHER): Payer: BC Managed Care – PPO | Admitting: Internal Medicine

## 2022-01-23 ENCOUNTER — Encounter: Payer: Self-pay | Admitting: Internal Medicine

## 2022-01-23 VITALS — BP 118/68 | HR 73 | Ht 70.5 in | Wt 154.6 lb

## 2022-01-23 DIAGNOSIS — C6290 Malignant neoplasm of unspecified testis, unspecified whether descended or undescended: Secondary | ICD-10-CM

## 2022-01-23 DIAGNOSIS — G90A Postural orthostatic tachycardia syndrome (POTS): Secondary | ICD-10-CM

## 2022-01-23 NOTE — Patient Instructions (Addendum)
Medication Instructions:  Your physician recommends that you continue on your current medications as directed. Please refer to the Current Medication list given to you today.  *If you need a refill on your cardiac medications before your next appointment, please call your pharmacy*   Lab Work: None ordered.  If you have labs (blood work) drawn today and your tests are completely normal, you will receive your results only by: [place] (if you have MyChart) OR A paper copy in the mail If you have any lab test that is abnormal or we need to change your treatment, we will call you to review the results.   Testing/Procedures: None ordered.    Follow-Up: At [facility], you and your health needs are our priority.  As part of our continuing mission to provide you with exceptional heart care, we have created designated Provider Care Teams.  These Care Teams include your primary Cardiologist (physician) and Advanced Practice Providers (APPs -  Physician Assistants and Nurse Practitioners) who all work together to provide you with the care you need, when you need it.  We recommend signing up for the patient portal called "MyChart".  Sign up information is provided on this After Visit Summary.  MyChart is used to connect with patients for Virtual Visits (Telemedicine).  Patients are able to view lab/test results, encounter notes, upcoming appointments, etc.  Non-urgent messages can be sent to your provider as well.   To learn more about what you can do with MyChart, go to [email or web address].    Your next appointment:   Follow up with Dr Peggyann [facility] - referral has been made.  Important Information About Sugar

## 2022-01-23 NOTE — Progress Notes (Signed)
CARDIOLOGY OFFICE  NOTE  Patient ID: Manuel [name], MRN: [id number], DOB/AGE: [birth year] 41 y.o. Admit date: (Not on file) Date of Consult: 01/23/2022  Primary Physician: Gaynelle Arabian, MD Primary Cardiologist: SK  Manuel [name] is a 41 y.o. male who is being seen today for  re evaluation of POTS .     Chief Complaint:     HPI Manuel [name] is a 41 y.o. male  With hx of testicular cancer associated with abdominal, genital and chest irradiation which predated by about 10 yrs, symptoms of postural and exercise intolerance which appeared relatively abruptly 2017.  When last seen in 2020 he had scant symptoms.  Hydration salt and compressive wear had largely mitigated them.  FAll 2021 recurrence of problems including shower intolerance, difficulties with exercising.  He has had 1 syncopal and 1 presyncopal episode that occurred following showers, associated with a recognizable prodrome of 3 to 5 minutes duration, accompanied by pallor and significant residual fatigue. Associated with high heart rates and low blood pressure.    Given mantle irradiation, a literature highlights the potential issues of autonomic derangements.  I contacted One of the leaders in the field, Dr. Annabell Sabal at [facility], who is willing to see the patient.  Not been consummated.  His complaints today are that many days he just feels terrible and limited by overwhelming fatigue.  These events can be triggered by meals and/or driving.  They are associated with a sensation of something is not right almost and agitation, adrenaline, and he is seen that his blood pressure is 170s over 110s.  These are frequently followed by headaches which she describes as migraines and are associate with photophobia.  A dark room and an hour or so later he feels significantly better and blood pressures have returned to normal.  This sensation he thinks is different from the post shower fatigue.  He is no longer able to exercise  because it is triggering of significant fatigue.  He is continue to use salt and compression but to much less avail  He has had COVID.   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   Viral meningitis 04/08/2014   Vision changes 10/05/2015      Surgical History:  Past Surgical History:  Procedure Laterality Date   ORCHIECTOMY     ROTATOR CUFF REPAIR     SURGERY SCROTAL / TESTICULAR  2006     Home Meds: Prior to Admission medications   Not on File     Allergies:  Allergies  Allergen Reactions   Codeine Nausea Only and Anaphylaxis    GI upset   Contrast Media [Iodinated Contrast Media] Anaphylaxis   Erythromycin Hives, Itching and Anaphylaxis   Iodine Anaphylaxis    Other reaction(s): anaphylaxis   Shellfish Allergy Anaphylaxis   Doxycycline Nausea And Vomiting   Azithromycin  Dilaudid [Hydromorphone Hcl] Other (See Comments)    Nausea   Dilaudid [Hydromorphone]     Other reaction(s): urinary retention   Ioversol    Nsaids Other (See Comments)    Other reaction(s): Palpitations (01/2015)   Tetracyclines & Related    Prednisone Anxiety    Stomach upset      ROS:  Please see the history of present illness.     All other systems reviewed and negative.  BP 118/68   Pulse 73   Ht 5' 10.5" (1.791 m)   Wt 154 lb 9.6 oz (70.1 kg)   SpO2 98%   BMI 21.87 kg/m  Well developed and nourished in no acute distress HENT normal Neck  supple with JVP-  flat   Clear Regular rate and rhythm, no murmurs or gallops Abd-soft with active BS No Clubbing cyanosis edema Skin-warm and dry A & Oriented  Grossly normal sensory and motor function  ECG sinus at 73 Intervals 13/09/38 Otherwise normal  Assessment and Plan:  POTS in the past  Shower intolerance  Syncope/presyncope  Testicular cancer  Chest abdomen and perineal radiation  Fatigue  Labile blood pressure and raising the question of Baro reflex failure   Very difficult situation.  His orthostatic objective numbers today are normal.  The labile blood pressure may be a very reflex issue triggered by changes in splanchnic flow or positional things from prolonged sitting.  I will need to do some reading but he reports that there are groups of post radiation patients who have had similar symptoms.  In this regard I think it is going to be imperative for his wellbeing that he reach out to the expertise of Dr. Annabell Sabal and her team at [facility].  We discussed the use of propranolol as a peripheral and clonidine as a central sympatholytic to try to help modulate the symptoms.  I will have to do some reading.  The fatigue and the association with exertion both physical and mental is concerning in the wake of his COVID history for postviral fatigue syndrome.  In this regard I suggested that he try to pace himself as much as he can delegate is much as he can.  We have also discussed briefly the existential challenges that he is facing with the symptoms that he cannot control and their unknown course.  In this regard again Dr. Jearld Pies her team may have seen difficult expertise second help   53 min was spent in care of the patient including the review of records      Virl Axe

## 2022-02-06 DIAGNOSIS — J019 Acute sinusitis, unspecified: Secondary | ICD-10-CM | POA: Diagnosis not present

## 2022-02-19 ENCOUNTER — Telehealth (HOSPITAL_COMMUNITY): Payer: Self-pay | Admitting: *Deleted

## 2022-02-19 NOTE — Telephone Encounter (Signed)
Reaching out  patient regarding his cardiac MRI. He states he would like to cancel his cardiac MRI appointment since he is seeing [facility] providers that will assess his current situation.  He may reschedule the MRI based upon advice of the [facility] providers. Patient's appointment canceled.  Gordy Clement RN Navigator Cardiac Imaging [facility] [phone] Office [phone] Cell

## 2022-02-20 ENCOUNTER — Ambulatory Visit (HOSPITAL_COMMUNITY): Admission: RE | Admit: 2022-02-20 | Payer: BC Managed Care – PPO | Source: Ambulatory Visit

## 2022-04-10 DIAGNOSIS — M7502 Adhesive capsulitis of left shoulder: Secondary | ICD-10-CM | POA: Diagnosis not present

## 2022-05-24 ENCOUNTER — Ambulatory Visit (INDEPENDENT_AMBULATORY_CARE_PROVIDER_SITE_OTHER): Payer: BC Managed Care – PPO | Admitting: Orthopaedic Surgery

## 2022-05-24 ENCOUNTER — Encounter: Payer: Self-pay | Admitting: Orthopaedic Surgery

## 2022-05-24 ENCOUNTER — Ambulatory Visit (INDEPENDENT_AMBULATORY_CARE_PROVIDER_SITE_OTHER): Payer: BC Managed Care – PPO

## 2022-05-24 DIAGNOSIS — M79672 Pain in left foot: Secondary | ICD-10-CM

## 2022-05-24 NOTE — Progress Notes (Signed)
Office Visit Note   Patient: Manuel [name]           Date of Birth: 12/10/1980           MRN: [id number] Visit Date: 05/24/2022              Requested by: Gaynelle Arabian, MD [address] [place],  [place] [address] PCP: Gaynelle Arabian, MD   Assessment & Plan: Visit Diagnoses:  1. Pain in left foot     Plan: Impression is acute strain of the left peroneus brevis.  Immobilization discussed and we will try ASO brace rather than cam boot as he is still able to weight-bear and walk in regular shoes.  He is to wear the ASO until he feels improvement at which point he can wean the ASO.  Recommend symptomatic treatment with Tylenol and ice.  If symptoms persist beyond a few more weeks may need to consider physical therapy.  Follow-Up Instructions: No follow-ups on file.   Orders:  Orders Placed This Encounter  Procedures   XR Foot Complete Left   No orders of the defined types were placed in this encounter.     Procedures: No procedures performed   Clinical Data: No additional findings.   Subjective: Chief Complaint  Patient presents with   Left Foot - Pain    HPI Manuel [name] is a 41 year old gentleman who comes in for evaluation of lateral left foot pain after stepping in a hole 10 days ago and twisting and inverting his foot.  He has been icing daily.  He can take Tylenol but cannot take NSAIDs.  He has had increased pain with weightbearing to the base of the fifth metatarsal.  Originally had some bruising and swelling but this has resolved for the most part.  Mainly has pain with weightbearing.  Review of Systems   Objective: Vital Signs: There were no vitals taken for this visit.  Physical Exam  Ortho Exam Examination of left foot shows no significant swelling or bruising.  Ankle and subtalar motion are normal and without pain.  Lateral ankle ligaments are not tender.  Peroneal tendons are stable.  He has good strength to ankle eversion but with  significant pain to resistance.  He has a fair amount of tenderness to the base of the fifth metatarsal.  No neurovascular compromise of the foot. Specialty Comments:  No specialty comments available.  Imaging: XR Foot Complete Left  Result Date: 05/24/2022 No acute or structural abnormalities.    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

## 2022-06-19 ENCOUNTER — Encounter: Payer: Self-pay | Admitting: Internal Medicine

## 2022-07-03 ENCOUNTER — Ambulatory Visit: Payer: BC Managed Care – PPO | Attending: Internal Medicine | Admitting: Internal Medicine

## 2022-07-03 ENCOUNTER — Encounter: Payer: Self-pay | Admitting: Internal Medicine

## 2022-07-03 VITALS — BP 106/83 | HR 83 | Ht 70.5 in | Wt 149.6 lb

## 2022-07-03 DIAGNOSIS — G90A Postural orthostatic tachycardia syndrome (POTS): Secondary | ICD-10-CM

## 2022-07-03 MED ORDER — PROPRANOLOL HCL 20 MG PO TABS: 20.0000 mg | ORAL_TABLET | ORAL | 0 refills | Status: DC | PRN

## 2022-07-03 NOTE — Patient Instructions (Signed)
Medication Instructions:  Your physician recommends that you continue on your current medications as directed. Please refer to the Current Medication list given to you today.  *If you need a refill on your cardiac medications before your next appointment, please call your pharmacy*   Lab Work: None ordered.  If you have labs (blood work) drawn today and your tests are completely normal, you will receive your results only by: [place] (if you have MyChart) OR A paper copy in the mail If you have any lab test that is abnormal or we need to change your treatment, we will call you to review the results.   Testing/Procedures: None ordered.    Follow-Up: At [facility], you and your health needs are our priority.  As part of our continuing mission to provide you with exceptional heart care, we have created designated Provider Care Teams.  These Care Teams include your primary Cardiologist (physician) and Advanced Practice Providers (APPs -  Physician Assistants and Nurse Practitioners) who all work together to provide you with the care you need, when you need it.  We recommend signing up for the patient portal called "MyChart".  Sign up information is provided on this After Visit Summary.  MyChart is used to connect with patients for Virtual Visits (Telemedicine).  Patients are able to view lab/test results, encounter notes, upcoming appointments, etc.  Non-urgent messages can be sent to your provider as well.   To learn more about what you can do with MyChart, go to [email or web address].    Your next appointment:   6 week telephone visit with Dr Caryl Comes  Important Information About Sugar

## 2022-07-03 NOTE — Progress Notes (Signed)
CARDIOLOGY OFFICE  NOTE  Patient ID: Manuel [name], MRN: [id number], DOB/AGE: 43/17/1982 43 y.o. Admit date: (Not on file) Date of Consult: 07/03/2022  Primary Physician: Gaynelle Arabian, MD Primary Cardiologist: SK  Manuel [name] is a 41 y.o. male who is being seen today for  re evaluation of POTS .     Chief Complaint:     HPI Manuel [name] is a 41 y.o. male  With hx of testicular cancer associated with abdominal, genital and chest irradiation which predated by about 10 yrs, symptoms of postural and exercise intolerance which appeared relatively abruptly 2017.  When last seen in 2020 he had scant symptoms.  Hydration salt and compressive wear had largely mitigated them.  FAll 2021 recurrence of problems including shower intolerance, difficulties with exercising.  He has had 1 syncopal and 1 presyncopal episode that occurred following showers, associated with a recognizable prodrome of 3 to 5 minutes duration, accompanied by pallor and significant residual fatigue. Associated with high heart rates and low blood pressure.    Given mantle irradiation, a literature highlights the potential issues of autonomic derangements.  I contacted One of the leaders in the field, Dr. Annabell Sabal at [facility], who is willing to see the patient.  An appointment was set up with one of her colleagues, unfortunately it did not happen.  There is another phone message from [facility] on 7/20 about having been some more the blood pressure 200/120  He continues to struggle to drive, with significant symptoms after about 30 minutes.  Notices it more on the highway and on back roads.  This is what happened on his way to [facility] [address].  Associated with significant the elevated blood pressure.  Also remains intolerant of exercise  He has had COVID.   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   Viral meningitis 04/08/2014   Vision changes 10/05/2015      Surgical History:  Past Surgical History:  Procedure Laterality Date   ORCHIECTOMY     ROTATOR CUFF REPAIR     SURGERY SCROTAL / TESTICULAR  2006     Home Meds: Prior to Admission medications   Not on File     Allergies:  Allergies  Allergen Reactions   Codeine Nausea Only and Anaphylaxis    GI upset   Contrast Media [Iodinated Contrast Media] Anaphylaxis   Erythromycin Hives, Itching and Anaphylaxis   Iodine Anaphylaxis    Other reaction(s): anaphylaxis   Shellfish Allergy Anaphylaxis   Doxycycline Nausea And Vomiting   Azithromycin    Dilaudid [Hydromorphone Hcl] Other (See Comments)    Nausea   Dilaudid [Hydromorphone]     Other reaction(s): urinary retention   Ioversol    Nsaids Other (See Comments)    Other reaction(s): Palpitations (01/2015)   Tetracyclines & Related    Prednisone Anxiety  Stomach upset      ROS:  Please see the history of present illness.     All other systems reviewed and negative.  BP 106/83 (BP Location: Right Arm, Patient Position: Standing)   Pulse 83   Ht 5' 10.5" (1.791 m)   Wt 149 lb 9.6 oz (67.9 kg)   SpO2 98%   BMI 21.16 kg/m  Well developed and nourished in no acute distress HENT normal Neck supple with JVP-  flat   Clear Regular rate and rhythm, no murmurs or gallops Abd-soft with active BS No Clubbing cyanosis edema Skin-warm and dry A & Oriented  Grossly normal sensory and motor function  ECG sinus @ 83   Assessment and Plan:   POTS in the past  Shower intolerance  Syncope/presyncope  Testicular cancer  Chest abdomen and perineal radiation  Fatigue  Labile blood pressure and raising the question of Baro reflex failure   Patient discussed strategies for trying to mitigate his symptoms, the worst it is while he is driving.  I wonder whether his venous pooling may have been triggered as the sympathetic response that is associate with his blood pressures over 200; notably it is worse while driving highways versus back roads which suggest that the leg activity may be mitigated.  We will try also to make a correlation between his early morning blood pressures and heart rates and his good days and bad days to see if we can come up with a day-to-day strategy of therapeutics based on blood pressure and heart rate  Have encouraged him also to follow-up and reestablish connection with a 15  I wonder also to what degree the car and driving to appointments is not itself notated by a psychological overlay given the disruption of life over recent years.        Virl Axe

## 2022-08-14 ENCOUNTER — Ambulatory Visit: Payer: BC Managed Care – PPO | Attending: Cardiovascular Disease | Admitting: Internal Medicine

## 2022-08-14 ENCOUNTER — Ambulatory Visit: Payer: BC Managed Care – PPO | Admitting: Internal Medicine

## 2022-08-14 ENCOUNTER — Encounter: Payer: Self-pay | Admitting: Internal Medicine

## 2022-08-14 VITALS — BP 123/78 | HR 96 | Ht 70.5 in | Wt 149.0 lb

## 2022-08-14 DIAGNOSIS — G90A Postural orthostatic tachycardia syndrome (POTS): Secondary | ICD-10-CM

## 2022-08-14 DIAGNOSIS — R55 Syncope and collapse: Secondary | ICD-10-CM

## 2022-08-14 NOTE — Progress Notes (Signed)
Electrophysiology TeleHealth Note   Due to national recommendations of social distancing due to COVID 19, an audio/video telehealth visit is felt to be most appropriate for this patient at this time.  See MyChart message from today for the patient's consent to telehealth for [facility]   Date:  08/14/2022   ID:  Manuel [name], DOB 10/15/1980, MRN [id number]  Location: patient's home  Provider location: [address] [place]  Evaluation Performed: Initial Evaluation  PCP:  Gaynelle Arabian, MD  Cardiologist:  None  Electrophysiologist:  None   Chief Complaint:    History of Present Illness:    Manuel [name] is a 41 y.o. 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  Using thigh compression better than calf compression now 30 minutes or more  Shower and hot tub intolerance with measured low BP; has lowered the height of temperature to about 98 and is tolerating this better  More exercise -- recumbent daily significant lightheadedness with termination of exercise and standing.      The patient denies symptoms of fevers, chills, cough, or new SOB worrisome for COVID 19.   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 45 tablet 0   No current facility-administered medications for this visit.    Allergies:   Codeine, Contrast media [iodinated contrast media], Erythromycin, Iodine, Shellfish allergy, Doxycycline, Azithromycin, Dilaudid [hydromorphone hcl], Dilaudid [hydromorphone], Ioversol, Nsaids, Tetracyclines & related, and Prednisone      ROS:  Please see the history of present illness.   All other systems are personally reviewed and negative.    Exam:    Vital Signs:  BP 123/78   Pulse 96   Ht 5' 10.5" (1.791 m)   Wt 149 lb (67.6 kg)   BMI 21.08 kg/m        Labs/Other Tests and Data Reviewed:    Recent Labs: No results found for requested labs within last 365 days.   Wt Readings from Last 3 Encounters:  08/14/22 149 lb (67.6 kg)  07/03/22 149 lb 9.6 oz (67.9 kg)  01/23/22 154 lb 9.6 oz (70.1 kg)     Other studies personally reviewed:     ASSESSMENT & PLAN:   POTS in the past  Shower intolerance   Syncope/presyncope   Testicular cancer   Chest abdomen and perineal radiation   Fatigue   Labile blood pressure and raising the question of Baro reflex failure   Significantly improved.  Working on his own electrolyte solution goal is 2-3 g of sodium a day  Reviewed recumbent exercise.  He is doing this.  The importance of high risk.  When he stops and stands.  Using a chair as a way station.  Driving is improved.  Using thigh sleeves.  Continue to use isometrics and walking       COVID 19 screen The patient denies symptoms of COVID 19 at this time.  The importance of social distancing was discussed  today.  Follow-up:  5 m  Current medicines are reviewed at length with the patient today.   The patient  concerns regarding his medicines.  The following changes were made today:    Labs/ tests ordered today include:  No orders of the defined types were placed in this encounter.   Future tests ( post COVID )     months  Patient Risk:  after full review of this patients clinical status, I feel that they are at moderate risk at this time.  Today, I have spent 18 minutes with the patient with telehealth technology discussing (As above)  .    Signed, Virl Axe, MD  08/14/2022 4:41 PM     [place] [address] [phone] (office) [phone] (fax)

## 2022-09-27 ENCOUNTER — Telehealth: Payer: Self-pay | Admitting: Hematology and Oncology

## 2022-09-27 NOTE — Telephone Encounter (Signed)
Patient called to follow-up with a new provider due to providers departure. Scheduled patient with new provider.

## 2022-10-07 ENCOUNTER — Other Ambulatory Visit: Payer: Self-pay | Admitting: Hematology and Oncology

## 2022-10-07 DIAGNOSIS — C6292 Malignant neoplasm of left testis, unspecified whether descended or undescended: Secondary | ICD-10-CM

## 2022-10-08 ENCOUNTER — Other Ambulatory Visit: Payer: Self-pay

## 2022-10-08 ENCOUNTER — Inpatient Hospital Stay: Payer: BC Managed Care – PPO | Attending: Nurse Practitioner

## 2022-10-08 ENCOUNTER — Encounter: Payer: Self-pay | Admitting: Hematology and Oncology

## 2022-10-08 ENCOUNTER — Inpatient Hospital Stay (HOSPITAL_BASED_OUTPATIENT_CLINIC_OR_DEPARTMENT_OTHER): Payer: BC Managed Care – PPO | Admitting: Hematology and Oncology

## 2022-10-08 ENCOUNTER — Telehealth: Payer: Self-pay

## 2022-10-08 VITALS — BP 110/68 | HR 72 | Temp 98.1°F | Resp 18 | Ht 70.5 in | Wt 142.8 lb

## 2022-10-08 DIAGNOSIS — R0602 Shortness of breath: Secondary | ICD-10-CM | POA: Diagnosis not present

## 2022-10-08 DIAGNOSIS — R634 Abnormal weight loss: Secondary | ICD-10-CM | POA: Insufficient documentation

## 2022-10-08 DIAGNOSIS — C6292 Malignant neoplasm of left testis, unspecified whether descended or undescended: Secondary | ICD-10-CM

## 2022-10-08 DIAGNOSIS — R1114 Bilious vomiting: Secondary | ICD-10-CM

## 2022-10-08 DIAGNOSIS — R112 Nausea with vomiting, unspecified: Secondary | ICD-10-CM | POA: Diagnosis not present

## 2022-10-08 DIAGNOSIS — R630 Anorexia: Secondary | ICD-10-CM | POA: Insufficient documentation

## 2022-10-08 DIAGNOSIS — R5383 Other fatigue: Secondary | ICD-10-CM | POA: Diagnosis not present

## 2022-10-08 DIAGNOSIS — Z8547 Personal history of malignant neoplasm of testis: Secondary | ICD-10-CM | POA: Diagnosis not present

## 2022-10-08 LAB — CMP (CANCER CENTER ONLY)
ALT: 13 U/L (ref 0–44)
AST: 14 U/L — ABNORMAL LOW (ref 15–41)
Albumin: 4.3 g/dL (ref 3.5–5.0)
Alkaline Phosphatase: 77 U/L (ref 38–126)
Anion gap: 5 (ref 5–15)
BUN: 15 mg/dL (ref 6–20)
CO2: 29 mmol/L (ref 22–32)
Calcium: 9.6 mg/dL (ref 8.9–10.3)
Chloride: 104 mmol/L (ref 98–111)
Creatinine: 0.91 mg/dL (ref 0.61–1.24)
GFR, Estimated: >60 mL/min (ref >60)
Glucose, Bld: 121 mg/dL — ABNORMAL HIGH (ref 70–99)
Potassium: 4.3 mmol/L (ref 3.5–5.1)
Sodium: 138 mmol/L (ref 135–145)
Total Bilirubin: 1.1 mg/dL (ref 0.3–1.2)
Total Protein: 6.8 g/dL (ref 6.5–8.1)

## 2022-10-08 LAB — CBC WITH DIFFERENTIAL (CANCER CENTER ONLY)
Abs Immature Granulocytes: 0.01 10*3/uL (ref 0.00–0.07)
Basophils Absolute: 0 10*3/uL (ref 0.0–0.1)
Basophils Relative: 1 %
Eosinophils Absolute: 0.1 10*3/uL (ref 0.0–0.5)
Eosinophils Relative: 2 %
HCT: 43.2 % (ref 39.0–52.0)
Hemoglobin: 14.7 g/dL (ref 13.0–17.0)
Immature Granulocytes: 0 %
Lymphocytes Relative: 32 %
Lymphs Abs: 2 10*3/uL (ref 0.7–4.0)
MCH: 29.3 pg (ref 26.0–34.0)
MCHC: 34 g/dL (ref 30.0–36.0)
MCV: 86.2 fL (ref 80.0–100.0)
Monocytes Absolute: 0.4 10*3/uL (ref 0.1–1.0)
Monocytes Relative: 7 %
Neutro Abs: 3.6 10*3/uL (ref 1.7–7.7)
Neutrophils Relative %: 58 %
Platelet Count: 239 10*3/uL (ref 150–400)
RBC: 5.01 MIL/uL (ref 4.22–5.81)
RDW: 12.7 % (ref 11.5–15.5)
WBC Count: 6.2 10*3/uL (ref 4.0–10.5)
nRBC: 0 % (ref 0.0–0.2)

## 2022-10-08 MED ORDER — PREDNISONE 50 MG PO TABS: ORAL_TABLET | ORAL | 0 refills | Status: DC

## 2022-10-08 MED ORDER — PROCHLORPERAZINE MALEATE 10 MG PO TABS: 10.0000 mg | ORAL_TABLET | Freq: Four times a day (QID) | ORAL | 0 refills | Status: DC | PRN

## 2022-10-08 MED ORDER — ONDANSETRON HCL 8 MG PO TABS: 8.0000 mg | ORAL_TABLET | Freq: Three times a day (TID) | ORAL | 3 refills | Status: DC | PRN

## 2022-10-08 NOTE — Assessment & Plan Note (Signed)
His last CT imaging was more than 2 years ago I am concerned about his new onset of anorexia and significant 20+ pound weight loss over the past year I recommend CT imaging to rule out cancer recurrence If that is unrevealing, he would need referral back to his gastroenterologist for further evaluation

## 2022-10-08 NOTE — Assessment & Plan Note (Signed)
I am concerned about recurrent malignancy For now, we will try to remove barrier from eating such as nausea I recommend frequent small meals and concentrate on high-protein intake

## 2022-10-08 NOTE — Telephone Encounter (Signed)
Called and left a message requesting endo notes faxed to the office. Faxed a request to [phone] to medical records requesting endo records.

## 2022-10-08 NOTE — Assessment & Plan Note (Signed)
He has significant chronic nausea with vomiting I recommend antiemetics as needed until further workup I will get his last EGD report faxed to [place] and he might need to be referred back to Dr. Watt Climes for evaluation

## 2022-10-08 NOTE — Progress Notes (Signed)
[facility] FOLLOW-UP progress notes  Patient Care Team: Gaynelle Arabian, MD as PCP - General (Family Medicine)  CHIEF COMPLAINTS/PURPOSE OF VISIT:  History of testicular cancer, for further evaluation  HISTORY OF PRESENTING ILLNESS:  Manuel [name] 42 y.o. male was transferred to my care after his prior physician has left.  The patient has remote history of testicular cancer status post  radiation treatment His treatment course was complicated by potential damage to his cardiovascular system and he was evaluated extensively by cardiologist He also have history of nausea and had GI evaluation in the past I reviewed the patient's records extensive and collaborated the history with the patient. Summary of his history is as follows: Oncology History  Testicular cancer ([place])  07/17/2012 Initial Diagnosis   Testicular cancer ([place])   12/05/2020 Cancer Staging   Staging form: Testis, AJCC 8th Edition - Clinical: Stage Unknown (cTX, cNX, cM0, S0) - Signed by Wyatt Portela, MD on 12/05/2020    Since his last visit, the patient has gone downhill He has developed anorexia and has lost over 20 pounds over the past year and a half He started to feel better in the summer and he had occasional nausea and vomiting especially in the middle of the night He has shortness of breath on minimal exertion and excessive fatigue He stopped playing competitive tennis for over a year  he denies constipation  MEDICAL HISTORY:  Past Medical History:  Diagnosis Date   Bilateral flank pain 93/81/8299   Complication of lumbar puncture 10/05/2015   Concussion    pt reports he has hx of multiple concussions, last one was 2009   Headache 10/05/2015   Hypotension    Left sided numbness 04/24/2012   Migraine    Nausea without vomiting 10/05/2015   POTS (postural orthostatic tachycardia syndrome)    Seizure ([place])    Subacute confusional state 10/05/2015   Syncope 04/24/2012   Testicular cancer ([place])  2006   Traumatic CSF leak 10/05/2015   Viral meningitis 04/08/2014   Vision changes 10/05/2015    SURGICAL HISTORY: Past Surgical History:  Procedure Laterality Date   ORCHIECTOMY     ROTATOR CUFF [place] / TESTICULAR  2006    SOCIAL HISTORY: Social History   Socioeconomic History   Marital status: Married    Spouse name: [name]   Number of children: 2   Years of education: 16   Highest education level: Not on file  Occupational History   Occupation: Press photographer  Tobacco Use   Smoking status: Never   Smokeless tobacco: Never  Substance and Sexual Activity   Alcohol use: Not Currently   Drug use: No   Sexual activity: Not on file  Other Topics Concern   Not on file  Social History Narrative   Lives with wife and kids   Caffeine use: none   Social Determinants of Health   Financial Resource Strain: Not on file  Food Insecurity: Not on file  Transportation Needs: Not on file  Physical Activity: Not on file  Stress: Not on file  Social Connections: Not on file  Intimate Partner Violence: Not on file    FAMILY HISTORY: Family History  Problem Relation Age of Onset   Migraines Mother    Diabetes type II Father     ALLERGIES:  is allergic to codeine, contrast media [iodinated contrast media], erythromycin, iodine, shellfish allergy, doxycycline, azithromycin, dilaudid [hydromorphone hcl], dilaudid [hydromorphone], ioversol, nsaids, tetracyclines &  related, and prednisone.  MEDICATIONS:  Current Outpatient Medications  Medication Sig Dispense Refill   ondansetron (ZOFRAN) 8 MG tablet Take 1 tablet (8 mg total) by mouth every 8 (eight) hours as needed for nausea. 30 tablet 3   predniSONE (DELTASONE) 50 MG tablet Take 1 pill at 13 hours, 7 hours and 1 hour before CT scan 3 tablet 0   prochlorperazine (COMPAZINE) 10 MG tablet Take 1 tablet (10 mg total) by mouth every 6 (six) hours as needed for nausea or vomiting. 30 tablet 0   propranolol (INDERAL) 20 MG  tablet Take 1 tablet (20 mg total) by mouth as needed (as needed before showering). 45 tablet 0   No current facility-administered medications for this visit.    REVIEW OF SYSTEMS:   Constitutional: Denies fevers, chills or abnormal night sweats Eyes: Denies blurriness of vision, double vision or watery eyes Ears, nose, mouth, throat, and face: Denies mucositis or sore throat Cardiovascular: Denies palpitation, chest discomfort or lower extremity swelling Skin: Denies abnormal skin rashes Lymphatics: Denies new lymphadenopathy or easy bruising Neurological:Denies numbness, tingling or new weaknesses Behavioral/Psych: Mood is stable, no new changes  All other systems were reviewed with the patient and are negative.  PHYSICAL EXAMINATION: ECOG PERFORMANCE STATUS: 1 - Symptomatic but completely ambulatory  Vitals:   10/08/22 1117  BP: 110/68  Pulse: 72  Resp: 18  Temp: 98.1 F (36.7 C)  SpO2: 97%   Filed Weights   10/08/22 1117  Weight: 142 lb 12.8 oz (64.8 kg)    GENERAL:alert, no distress and comfortable SKIN: skin color, texture, turgor are normal, no rashes or significant lesions EYES: normal, conjunctiva are pink and non-injected, sclera clear OROPHARYNX:no exudate, normal lips, buccal mucosa, and tongue  NECK: supple, thyroid normal size, non-tender, without nodularity LYMPH:  no palpable lymphadenopathy in the cervical, axillary or inguinal LUNGS: clear to auscultation and percussion with normal breathing effort HEART: regular rate & rhythm and no murmurs without lower extremity edema ABDOMEN:abdomen soft, non-tender and normal bowel sounds Musculoskeletal:no cyanosis of digits and no clubbing  PSYCH: alert & oriented x 3 with fluent speech NEURO: no focal motor/sensory deficits  LABORATORY DATA:  I have reviewed the data as listed Lab Results  Component Value Date   WBC 6.2 10/08/2022   HGB 14.7 10/08/2022   HCT 43.2 10/08/2022   MCV 86.2 10/08/2022   PLT  239 10/08/2022   Recent Labs    10/08/22 1058  NA 138  K 4.3  CL 104  CO2 29  GLUCOSE 121*  BUN 15  CREATININE 0.91  CALCIUM 9.6  GFRNONAA >60  PROT 6.8  ALBUMIN 4.3  AST 14*  ALT 13  ALKPHOS 77  BILITOT 1.1    ASSESSMENT & PLAN:  Testicular cancer His last CT imaging was more than 2 years ago I am concerned about his new onset of anorexia and significant 20+ pound weight loss over the past year I recommend CT imaging to rule out cancer recurrence If that is unrevealing, he would need referral back to his gastroenterologist for further evaluation  Nausea with vomiting He has significant chronic nausea with vomiting I recommend antiemetics as needed until further workup I will get his last EGD report faxed to [place] and he might need to be referred back to Dr. Watt Climes for evaluation  Unexplained weight loss I am concerned about recurrent malignancy For now, we will try to remove barrier from eating such as nausea I recommend frequent small meals and concentrate  on high-protein intake  Orders Placed This Encounter  Procedures   CT CHEST ABDOMEN PELVIS W CONTRAST    Standing Status:   Future    Standing Expiration Date:   10/09/2023    Order Specific Question:   Preferred imaging location?    Answer:   [facility]    Order Specific Question:   Radiology Contrast Protocol - do NOT remove file path    Answer:   [email or web address]    All questions were answered. The patient knows to call the clinic with any problems, questions or concerns. The total time spent in the appointment was 40 minutes encounter with patients including review of chart and various tests results, discussions about plan of care and coordination of care plan   Heath Lark, MD 10/08/2022 3:21 PM

## 2022-10-09 ENCOUNTER — Telehealth: Payer: Self-pay

## 2022-10-09 NOTE — Telephone Encounter (Signed)
Called and reminded to call radiology scheduling to schedule CT. He verbalized understanding and will call today.

## 2022-10-09 NOTE — Telephone Encounter (Signed)
-----   Message from Heath Lark, MD sent at 10/09/2022  9:52 AM EST ----- Pls remind him to call for CT scan to be done next week

## 2022-10-10 ENCOUNTER — Encounter: Payer: Self-pay | Admitting: Internal Medicine

## 2022-10-10 ENCOUNTER — Telehealth: Payer: Self-pay

## 2022-10-10 NOTE — Telephone Encounter (Signed)
Faxed a request to [facility] requesting last office faxed to Dr. Alvy Bimler.

## 2022-10-11 ENCOUNTER — Encounter: Payer: Self-pay | Admitting: Hematology and Oncology

## 2022-10-14 ENCOUNTER — Telehealth: Payer: Self-pay

## 2022-10-14 NOTE — Telephone Encounter (Signed)
Called and left a message. Appt with Dr. Alvy Bimler canceled on 2/16. Once he schedules the CT ask him to please call the office back and we will reschedule Dr. Alvy Bimler appt.

## 2022-10-15 ENCOUNTER — Other Ambulatory Visit: Payer: Self-pay | Admitting: Hematology and Oncology

## 2022-10-15 DIAGNOSIS — R634 Abnormal weight loss: Secondary | ICD-10-CM

## 2022-10-15 DIAGNOSIS — C6292 Malignant neoplasm of left testis, unspecified whether descended or undescended: Secondary | ICD-10-CM

## 2022-10-17 ENCOUNTER — Ambulatory Visit (HOSPITAL_BASED_OUTPATIENT_CLINIC_OR_DEPARTMENT_OTHER)
Admission: RE | Admit: 2022-10-17 | Discharge: 2022-10-17 | Disposition: A | Discharge status: Home / Self Care | Payer: BC Managed Care – PPO | Source: Ambulatory Visit | Attending: Hematology and Oncology | Admitting: Hematology and Oncology

## 2022-10-17 ENCOUNTER — Encounter (HOSPITAL_BASED_OUTPATIENT_CLINIC_OR_DEPARTMENT_OTHER): Payer: Self-pay

## 2022-10-17 DIAGNOSIS — N3289 Other specified disorders of bladder: Secondary | ICD-10-CM | POA: Diagnosis not present

## 2022-10-17 DIAGNOSIS — N289 Disorder of kidney and ureter, unspecified: Secondary | ICD-10-CM | POA: Diagnosis not present

## 2022-10-17 DIAGNOSIS — R634 Abnormal weight loss: Secondary | ICD-10-CM | POA: Diagnosis not present

## 2022-10-17 DIAGNOSIS — Z8547 Personal history of malignant neoplasm of testis: Secondary | ICD-10-CM | POA: Diagnosis not present

## 2022-10-17 DIAGNOSIS — C6292 Malignant neoplasm of left testis, unspecified whether descended or undescended: Secondary | ICD-10-CM | POA: Diagnosis not present

## 2022-10-18 ENCOUNTER — Ambulatory Visit: Payer: BC Managed Care – PPO | Admitting: Hematology and Oncology

## 2022-10-21 ENCOUNTER — Inpatient Hospital Stay (HOSPITAL_BASED_OUTPATIENT_CLINIC_OR_DEPARTMENT_OTHER): Payer: BC Managed Care – PPO | Admitting: Hematology and Oncology

## 2022-10-21 ENCOUNTER — Encounter: Payer: Self-pay | Admitting: Hematology and Oncology

## 2022-10-21 ENCOUNTER — Other Ambulatory Visit: Payer: Self-pay

## 2022-10-21 VITALS — BP 108/56 | HR 68 | Temp 98.5°F | Resp 18 | Ht 70.5 in | Wt 143.8 lb

## 2022-10-21 DIAGNOSIS — R5383 Other fatigue: Secondary | ICD-10-CM | POA: Diagnosis not present

## 2022-10-21 DIAGNOSIS — C6291 Malignant neoplasm of right testis, unspecified whether descended or undescended: Secondary | ICD-10-CM | POA: Diagnosis not present

## 2022-10-21 DIAGNOSIS — Z8547 Personal history of malignant neoplasm of testis: Secondary | ICD-10-CM | POA: Diagnosis not present

## 2022-10-21 DIAGNOSIS — R634 Abnormal weight loss: Secondary | ICD-10-CM | POA: Diagnosis not present

## 2022-10-21 DIAGNOSIS — R0602 Shortness of breath: Secondary | ICD-10-CM | POA: Diagnosis not present

## 2022-10-21 DIAGNOSIS — R630 Anorexia: Secondary | ICD-10-CM | POA: Diagnosis not present

## 2022-10-21 DIAGNOSIS — R112 Nausea with vomiting, unspecified: Secondary | ICD-10-CM | POA: Diagnosis not present

## 2022-10-21 NOTE — Assessment & Plan Note (Signed)
The cause is unknown He is somewhat constipated and moderate stools are seen on imaging study I wonder if his intermittent abdominal pain could be related to this I recommend GI follow-up and consideration for endoscopy evaluation

## 2022-10-21 NOTE — Assessment & Plan Note (Signed)
I have reviewed CT imaging with the patient There are no signs of cancer recurrence He does not need long-term follow-up

## 2022-10-21 NOTE — Progress Notes (Signed)
[facility] OFFICE PROGRESS NOTE  Patient Care Team: Gaynelle Arabian, MD as PCP - General (Family Medicine)  ASSESSMENT & PLAN:  Testicular cancer I have reviewed CT imaging with the patient There are no signs of cancer recurrence He does not need long-term follow-up  Unexplained weight loss The cause is unknown He is somewhat constipated and moderate stools are seen on imaging study I wonder if his intermittent abdominal pain could be related to this I recommend GI follow-up and consideration for endoscopy evaluation  No orders of the defined types were placed in this encounter.   All questions were answered. The patient knows to call the clinic with any problems, questions or concerns. The total time spent in the appointment was 20 minutes encounter with patients including review of chart and various tests results, discussions about plan of care and coordination of care plan   Heath Lark, MD 10/21/2022 2:57 PM  INTERVAL HISTORY: Please see below for problem oriented charting. he returns for review of test results He has intermittent abdominal discomfort on the left upper quadrant that comes and goes We discussed CT imaging results  REVIEW OF SYSTEMS:   Constitutional: Denies fevers, chills or abnormal weight loss Eyes: Denies blurriness of vision Ears, nose, mouth, throat, and face: Denies mucositis or sore throat Respiratory: Denies cough, dyspnea or wheezes Cardiovascular: Denies palpitation, chest discomfort or lower extremity swelling Gastrointestinal:  Denies nausea, heartburn or change in bowel habits Skin: Denies abnormal skin rashes Lymphatics: Denies new lymphadenopathy or easy bruising Neurological:Denies numbness, tingling or new weaknesses Behavioral/Psych: Mood is stable, no new changes  All other systems were reviewed with the patient and are negative.  I have reviewed the past medical history, past surgical history, social history and family  history with the patient and they are unchanged from previous note.  ALLERGIES:  is allergic to codeine, contrast media [iodinated contrast media], erythromycin, iodine, shellfish allergy, doxycycline, azithromycin, dilaudid [hydromorphone hcl], dilaudid [hydromorphone], ioversol, nsaids, tetracyclines & related, and prednisone.  MEDICATIONS:  Current Outpatient Medications  Medication Sig Dispense Refill   ondansetron (ZOFRAN) 8 MG tablet Take 1 tablet (8 mg total) by mouth every 8 (eight) hours as needed for nausea. 30 tablet 3   predniSONE (DELTASONE) 50 MG tablet Take 1 pill at 13 hours, 7 hours and 1 hour before CT scan 3 tablet 0   prochlorperazine (COMPAZINE) 10 MG tablet Take 1 tablet (10 mg total) by mouth every 6 (six) hours as needed for nausea or vomiting. 30 tablet 0   propranolol (INDERAL) 20 MG tablet Take 1 tablet (20 mg total) by mouth as needed (as needed before showering). 45 tablet 0   No current facility-administered medications for this visit.    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 He was treated with a radical inguinal orchiectomy by Dr. Rana Snare and subsequently received radiation 25.6 Gy in 1.6 Gy fractions 12/12/2004 through 01/02/2005 by Dr. Ledon Snare. Initial HCG tumor marker borderline elevated at 5 units, normal less than 3, with a normal alpha fetoprotein      PHYSICAL EXAMINATION: ECOG PERFORMANCE STATUS: 1 - Symptomatic but completely  ambulatory  Vitals:   10/21/22 1447  BP: (!) 108/56  Pulse: 68  Resp: 18  Temp: 98.5 F (36.9 C)  SpO2: 100%   Filed Weights   10/21/22 1447  Weight: 143 lb  12.8 oz (65.2 kg)    GENERAL:alert, no distress and comfortable NEURO: alert & oriented x 3 with fluent speech, no focal motor/sensory deficits  LABORATORY DATA:  I have reviewed the data as listed    Component Value Date/Time   NA 138 10/08/2022 1058   NA 140 10/16/2015 1525   NA 138 07/23/2013 1513   K 4.3 10/08/2022 1058   K 4.3 07/23/2013 1513   CL 104 10/08/2022 1058   CO2 29 10/08/2022 1058   CO2 24 07/23/2013 1513   GLUCOSE 121 (H) 10/08/2022 1058   GLUCOSE 105 07/23/2013 1513   BUN 15 10/08/2022 1058   BUN 20 10/16/2015 1525   BUN 21.1 07/23/2013 1513   CREATININE 0.91 10/08/2022 1058   CREATININE 0.9 07/23/2013 1513   CALCIUM 9.6 10/08/2022 1058   CALCIUM 9.7 07/23/2013 1513   PROT 6.8 10/08/2022 1058   PROT 7.4 07/23/2013 1513   ALBUMIN 4.3 10/08/2022 1058   ALBUMIN 4.2 07/23/2013 1513   AST 14 (L) 10/08/2022 1058   AST 14 07/23/2013 1513   ALT 13 10/08/2022 1058   ALT 15 07/23/2013 1513   ALKPHOS 77 10/08/2022 1058   ALKPHOS 65 07/23/2013 1513   BILITOT 1.1 10/08/2022 1058   BILITOT 1.02 07/23/2013 1513   GFRNONAA >60 10/08/2022 1058   GFRAA >60 04/12/2017 1537    No results found for: "SPEP", "UPEP"  Lab Results  Component Value Date   WBC 6.2 10/08/2022   NEUTROABS 3.6 10/08/2022   HGB 14.7 10/08/2022   HCT 43.2 10/08/2022   MCV 86.2 10/08/2022   PLT 239 10/08/2022      Chemistry      Component Value Date/Time   NA 138 10/08/2022 1058   NA 140 10/16/2015 1525   NA 138 07/23/2013 1513   K 4.3 10/08/2022 1058   K 4.3 07/23/2013 1513   CL 104 10/08/2022 1058   CO2 29 10/08/2022 1058   CO2 24 07/23/2013 1513   BUN 15 10/08/2022 1058   BUN 20 10/16/2015 1525   BUN 21.1 07/23/2013 1513   CREATININE 0.91 10/08/2022 1058   CREATININE 0.9 07/23/2013 1513      Component Value Date/Time   CALCIUM 9.6 10/08/2022 1058   CALCIUM 9.7 07/23/2013 1513   ALKPHOS 77 10/08/2022 1058   ALKPHOS 65 07/23/2013 1513   AST 14 (L) 10/08/2022 1058   AST  14 07/23/2013 1513   ALT 13 10/08/2022 1058   ALT 15 07/23/2013 1513   BILITOT 1.1 10/08/2022 1058   BILITOT 1.02 07/23/2013 1513       RADIOGRAPHIC STUDIES: I have reviewed imaging study with the patient extensively I have personally reviewed the radiological images as listed and agreed with the findings in the report. CT CHEST ABDOMEN PELVIS WO CONTRAST  Result Date: 10/17/2022 CLINICAL DATA:  History of left testicular cancer diagnosed 2006 recent painful abdominal pressure mainly left-sided with numbness in the left groin. 26 pound weight loss in last 7 months. * Tracking Code: BO * EXAM: CT CHEST, ABDOMEN AND PELVIS WITHOUT CONTRAST TECHNIQUE: Multidetector CT imaging of the chest, abdomen and pelvis was performed following the standard protocol without IV contrast. RADIATION DOSE REDUCTION: This exam was performed according to the departmental dose-optimization program which includes automated exposure  control, adjustment of the mA and/or kV according to patient size and/or use of iterative reconstruction technique. COMPARISON:  Multiple priors including CT August 16, 2021. FINDINGS: CT CHEST FINDINGS Cardiovascular: Normal caliber thoracic aorta. Aortic atherosclerosis. Normal size heart. No significant pericardial effusion/thickening. Mediastinum/Nodes: No suspicious thyroid nodule. No pathologically enlarged mediastinal, hilar or axillary lymph nodes, noting limited sensitivity for the detection of hilar adenopathy on this noncontrast study. The esophagus is grossly unremarkable. Lungs/Pleura: No suspicious pulmonary nodules or masses. No pleural effusion. No pneumothorax. Musculoskeletal: No aggressive lytic or blastic lesion of bone. CT ABDOMEN PELVIS FINDINGS Hepatobiliary: No suspicious hepatic lesion on noncontrast enhanced examination. Gallbladder is unremarkable. No biliary ductal dilation. Pancreas: No pancreatic ductal dilation or evidence of acute inflammation. Spleen: No  splenomegaly. Adrenals/Urinary Tract: Bilateral adrenal glands appear normal. No hydronephrosis. Hypodense 11 mm left upper pole renal lesion is compatible with a cyst and considered benign requiring no independent imaging follow-up. Additional subcentimeter left interpolar renal lesion is technically too small to accurately characterize but statistically likely to reflect a cyst and considered benign requiring no independent imaging follow-up. Urinary bladder is unremarkable for degree of distension Stomach/Bowel: No radiopaque enteric contrast material was administered. Stomach is unremarkable for degree of distension. No pathologic dilation of small or large bowel. No evidence of acute bowel inflammation. Vascular/Lymphatic: Normal caliber abdominal aorta. Smooth IVC contours. No pathologically enlarged mediastinal, hilar or axillary lymph nodes. Reproductive: Prostate glands unremarkable.  Prior left orchiectomy. Other: No significant abdominopelvic free fluid. Musculoskeletal: No aggressive lytic or blastic lesion of bone. 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 Stable sclerotic lesion in the right inter trochanteric femur compatible with a bone island. IMPRESSION: 1. Prior left orchiectomy without evidence of local recurrence. 2. No evidence of metastatic disease within the chest, abdomen, or pelvis on this noncontrast enhanced CT. 3. No acute abnormality identified in the chest, abdomen or pelvis. 4. 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 5.  Aortic Atherosclerosis (ICD10-I70.0). Electronically Signed   By: Dahlia Bailiff M.D.   On: 10/17/2022 15:11

## 2022-12-17 DIAGNOSIS — Z713 Dietary counseling and surveillance: Secondary | ICD-10-CM | POA: Diagnosis not present

## 2022-12-19 DIAGNOSIS — R634 Abnormal weight loss: Secondary | ICD-10-CM | POA: Diagnosis not present

## 2022-12-19 DIAGNOSIS — R1112 Projectile vomiting: Secondary | ICD-10-CM | POA: Diagnosis not present

## 2022-12-19 DIAGNOSIS — K30 Functional dyspepsia: Secondary | ICD-10-CM | POA: Diagnosis not present

## 2022-12-30 ENCOUNTER — Telehealth: Payer: Self-pay | Admitting: *Deleted

## 2022-12-30 NOTE — Telephone Encounter (Signed)
   [facility] Pre-operative Risk Assessment    Patient Name: Manuel [name]  DOB: December 25, 1980 MRN: [id number]  HEARTCARE STAFF:  - IMPORTANT!!!!!! Under Visit Info/Reason for Call, type in Other and utilize the format Clearance MM/DD/YY or Clearance TBD. Do not use dashes or single digits. - Please review there is not already an duplicate clearance open for this procedure. - If request is for dental extraction, please clarify the # of teeth to be extracted. - If the patient is currently at the dentist's office, call Pre-Op Callback Staff (MA/nurse) to input urgent request.  - If the patient is not currently in the dentist office, please route to the Pre-Op pool.  Request for surgical clearance:  What type of surgery is being performed? Endoscopy  When is this surgery scheduled? 01/01/2023  What type of clearance is required (medical clearance vs. Pharmacy clearance to hold med vs. Both)? Medical   Are there any medications that need to be held prior to surgery and how long? None listed  Practice name and name of physician performing surgery? [facility], Dr Ewing Schlein  What is the office phone number? [phone]   7.   What is the office fax number? [phone]  8.   Anesthesia type (None, local, MAC, general) ? Not listed   Valrie Hart 12/30/2022, 4:02 PM  _________________________________________________________________   (provider comments below)

## 2022-12-30 NOTE — Telephone Encounter (Signed)
I spoke with patient. He stated that he is unavailable at all tomorrow. He inquire about doing the endoscopy without the medication. I let him know that is something he would have to discuss with the GI dr. He asked what would happen. I did tell him that if the GI is requiring medication then the endoscopy would more than likely need to be rescheduled so that we can get him an appt.

## 2022-12-30 NOTE — Telephone Encounter (Signed)
   Name: Manuel [name]  DOB: 1981-04-10  MRN: [id number]  Primary Cardiologist: None   Preoperative team, please contact this patient and set up a phone call appointment for further preoperative risk assessment. Please obtain consent and complete medication review. Thank you for your help.  I confirm that guidance regarding antiplatelet and oral anticoagulation therapy has been completed and, if necessary, noted below.  None   [name], [name], NP 12/30/2022, 4:10 PM [facility]

## 2023-01-01 NOTE — Telephone Encounter (Signed)
Contacted Dr Marlane Hatcher office and referral coordinator confirmed the procedure was canceled.   Patient will contact the office when he is ready for an appointment for clearance.

## 2023-01-09 ENCOUNTER — Other Ambulatory Visit (HOSPITAL_COMMUNITY): Payer: Self-pay | Admitting: Gastroenterology

## 2023-01-09 DIAGNOSIS — R112 Nausea with vomiting, unspecified: Secondary | ICD-10-CM

## 2023-01-09 DIAGNOSIS — R634 Abnormal weight loss: Secondary | ICD-10-CM

## 2023-01-20 ENCOUNTER — Encounter (HOSPITAL_COMMUNITY)
Admission: RE | Admit: 2023-01-20 | Discharge: 2023-01-20 | Disposition: A | Discharge status: Home / Self Care | Payer: BC Managed Care – PPO | Source: Ambulatory Visit | Attending: Gastroenterology | Admitting: Gastroenterology

## 2023-01-20 ENCOUNTER — Encounter (HOSPITAL_COMMUNITY): Payer: BC Managed Care – PPO

## 2023-01-20 DIAGNOSIS — R112 Nausea with vomiting, unspecified: Secondary | ICD-10-CM | POA: Diagnosis not present

## 2023-01-20 DIAGNOSIS — R634 Abnormal weight loss: Secondary | ICD-10-CM | POA: Diagnosis not present

## 2023-01-20 MED ORDER — TECHNETIUM TC 99M SULFUR COLLOID
1.9000 | Freq: Once | INTRAVENOUS | Status: AC | PRN
  Administered 2023-01-20: 1.9 via INTRAVENOUS

## 2023-01-24 ENCOUNTER — Other Ambulatory Visit: Payer: Self-pay | Admitting: Gastroenterology

## 2023-01-24 DIAGNOSIS — R634 Abnormal weight loss: Secondary | ICD-10-CM | POA: Diagnosis not present

## 2023-01-24 DIAGNOSIS — R1112 Projectile vomiting: Secondary | ICD-10-CM | POA: Diagnosis not present

## 2023-01-29 ENCOUNTER — Ambulatory Visit: Payer: BC Managed Care – PPO | Admitting: Gastroenterology

## 2023-02-04 ENCOUNTER — Encounter (HOSPITAL_COMMUNITY): Payer: Self-pay | Admitting: Gastroenterology

## 2023-02-04 NOTE — Progress Notes (Signed)
Attempted to obtain medical history via telephone, unable to reach at this time. HIPAA compliant voicemail message left requesting return call to pre surgical testing department. 

## 2023-02-10 ENCOUNTER — Other Ambulatory Visit: Payer: Self-pay

## 2023-02-10 ENCOUNTER — Emergency Department (HOSPITAL_BASED_OUTPATIENT_CLINIC_OR_DEPARTMENT_OTHER): Payer: BC Managed Care – PPO | Admitting: Radiology

## 2023-02-10 ENCOUNTER — Emergency Department (HOSPITAL_BASED_OUTPATIENT_CLINIC_OR_DEPARTMENT_OTHER)
Admission: EM | Admit: 2023-02-10 | Discharge: 2023-02-10 | Disposition: A | Discharge status: Home / Self Care | Payer: BC Managed Care – PPO | Attending: Emergency Medicine | Admitting: Emergency Medicine

## 2023-02-10 ENCOUNTER — Encounter (HOSPITAL_BASED_OUTPATIENT_CLINIC_OR_DEPARTMENT_OTHER): Payer: Self-pay

## 2023-02-10 DIAGNOSIS — R9431 Abnormal electrocardiogram [ECG] [EKG]: Secondary | ICD-10-CM | POA: Diagnosis not present

## 2023-02-10 DIAGNOSIS — R634 Abnormal weight loss: Secondary | ICD-10-CM | POA: Diagnosis not present

## 2023-02-10 DIAGNOSIS — R112 Nausea with vomiting, unspecified: Secondary | ICD-10-CM | POA: Diagnosis not present

## 2023-02-10 DIAGNOSIS — K920 Hematemesis: Secondary | ICD-10-CM

## 2023-02-10 LAB — COMPREHENSIVE METABOLIC PANEL
ALT: 13 U/L (ref 0–44)
AST: 14 U/L — ABNORMAL LOW (ref 15–41)
Albumin: 4.5 g/dL (ref 3.5–5.0)
Alkaline Phosphatase: 68 U/L (ref 38–126)
Anion gap: 10 (ref 5–15)
BUN: 16 mg/dL (ref 6–20)
CO2: 25 mmol/L (ref 22–32)
Calcium: 9.4 mg/dL (ref 8.9–10.3)
Chloride: 101 mmol/L (ref 98–111)
Creatinine, Ser: 0.95 mg/dL (ref 0.61–1.24)
GFR, Estimated: >60 mL/min (ref >60)
Glucose, Bld: 96 mg/dL (ref 70–99)
Potassium: 4 mmol/L (ref 3.5–5.1)
Sodium: 136 mmol/L (ref 135–145)
Total Bilirubin: 1.3 mg/dL — ABNORMAL HIGH (ref 0.3–1.2)
Total Protein: 7 g/dL (ref 6.5–8.1)

## 2023-02-10 LAB — CBC
HCT: 42.2 % (ref 39.0–52.0)
Hemoglobin: 14.1 g/dL (ref 13.0–17.0)
MCH: 29.4 pg (ref 26.0–34.0)
MCHC: 33.4 g/dL (ref 30.0–36.0)
MCV: 88.1 fL (ref 80.0–100.0)
Platelets: 268 10*3/uL (ref 150–400)
RBC: 4.79 MIL/uL (ref 4.22–5.81)
RDW: 13 % (ref 11.5–15.5)
WBC: 9 10*3/uL (ref 4.0–10.5)
nRBC: 0 % (ref 0.0–0.2)

## 2023-02-10 LAB — URINALYSIS, ROUTINE W REFLEX MICROSCOPIC
Bilirubin Urine: NEGATIVE
Glucose, UA: NEGATIVE mg/dL
Hgb urine dipstick: NEGATIVE
Ketones, ur: NEGATIVE mg/dL
Leukocytes,Ua: NEGATIVE
Nitrite: NEGATIVE
Protein, ur: NEGATIVE mg/dL
Specific Gravity, Urine: 1.019 (ref 1.005–1.030)
pH: 5.5 (ref 5.0–8.0)

## 2023-02-10 LAB — LIPASE, BLOOD: Lipase: 22 U/L (ref 11–51)

## 2023-02-10 LAB — OCCULT BLOOD X 1 CARD TO LAB, STOOL: Fecal Occult Bld: NEGATIVE

## 2023-02-10 MED ORDER — ONDANSETRON HCL 4 MG/2ML IJ SOLN
4.0000 mg | Freq: Once | INTRAMUSCULAR | Status: DC
  Filled 2023-02-10: qty 2

## 2023-02-10 NOTE — ED Provider Notes (Signed)
[place] EMERGENCY DEPARTMENT AT [facility] Provider Note   CSN: [id number] Arrival date & time: 02/10/23  1532     History Chief Complaint  Patient presents with   Hematemesis    HPI Manuel [name] is a 42 y.o. male presenting for chief complaint of hematemesis.  42 year old male with nausea and vomiting for the last 6 months.  Had multiple episodes of vomiting today.  Initially food product followed by blood mixture followed by only blood.  Last vomiting episode an hour prior to arrival.  Denies chills nausea vomiting syncope shortness of breath at this time.  No abdominal pain.   Patient's recorded medical, surgical, social, medication list and allergies were reviewed in the Snapshot window as part of the initial history.   Review of Systems   Review of Systems  Constitutional:  Negative for chills and fever.  HENT:  Negative for ear pain and sore throat.   Eyes:  Negative for pain and visual disturbance.  Respiratory:  Negative for cough and shortness of breath.   Cardiovascular:  Negative for chest pain and palpitations.  Gastrointestinal:  Positive for nausea and vomiting. Negative for abdominal pain.  Genitourinary:  Negative for dysuria and hematuria.  Musculoskeletal:  Negative for arthralgias and back pain.  Skin:  Negative for color change and rash.  Neurological:  Negative for seizures and syncope.  All other systems reviewed and are negative.   Physical Exam Updated Vital Signs BP 110/72   Pulse 66   Temp 98.1 F (36.7 C) (Oral)   Resp 15   Ht 5\' 10"  (1.778 m)   Wt 59 kg   SpO2 100%   BMI 18.65 kg/m  Physical Exam Vitals and nursing note reviewed.  Constitutional:      General: He is not in acute distress.    Appearance: He is well-developed.  HENT:     Head: Normocephalic and atraumatic.  Eyes:     Conjunctiva/sclera: Conjunctivae normal.  Cardiovascular:     Rate and Rhythm: Normal rate and regular rhythm.     Heart sounds: No  murmur heard. Pulmonary:     Effort: Pulmonary effort is normal. No respiratory distress.     Breath sounds: Normal breath sounds.  Abdominal:     Palpations: Abdomen is soft.     Tenderness: There is no abdominal tenderness. There is no guarding.  Genitourinary:    Prostate: Normal.     Comments: Normal stool palpated. Musculoskeletal:        General: No swelling.     Cervical back: Neck supple.  Skin:    General: Skin is warm and dry.     Capillary Refill: Capillary refill takes less than 2 seconds.  Neurological:     Mental Status: He is alert.  Psychiatric:        Mood and Affect: Mood normal.      ED Course/ Medical Decision Making/ A&P    Procedures Procedures   Medications Ordered in ED Medications  ondansetron (ZOFRAN) injection 4 mg (has no administration in time range)    Medical Decision Making:    Manuel [name] is a 41 y.o. male who presented to the ED today with emesis detailed above.     HemodynamicComplete initial physical exam performed, notably the patient  was stable in no acute distress.      Reviewed and confirmed nursing documentation for past medical history, family history, social history.    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 Other diagnoses were considered including (but not limited to) Boerhaave syndrome, esophageal varices, bleeding peptic ulcer. These are considered less likely due to history of present illness and physical exam findings.   This is most consistent with an acute life/limb threatening illness complicated by underlying chronic conditions. In particular, given negative Hemoccult/negative rectal exam, more prolonged pathology is considered grossly inconsistent.  Therefore peptic ulcer disease or esophageal varices are less likely.  Patient does not have a history  of cirrhosis.  Mallory-Weiss is favored much more likely based on his history of prior chest radiation therapy for testicular cancer.  Initial Plan:  Screening labs including CBC and Metabolic panel to evaluate for infectious or metabolic etiology of disease.  Urinalysis with reflex culture ordered to evaluate for UTI or relevant urologic/nephrologic pathology.  CXR to evaluate for structural/infectious intrathoracic pathology.  EKG to evaluate for cardiac pathology. Objective evaluation as below reviewed with plan for close reassessment  Initial Study Results:   Laboratory  All laboratory results reviewed without evidence of clinically relevant pathology.    EKG EKG was reviewed independently. Rate, rhythm, axis, intervals all examined and without medically relevant abnormality. ST segments without concerns for elevations.    Radiology  All images reviewed independently. Agree with radiology report at this time.   DG Chest 2 View  Result Date: 02/10/2023 CLINICAL DATA:  Hematemesis. Weight loss for 3-6 months. Had a few episodes of blood in emesis in the past hour. History of left testicular cancer diagnosed in 2006. EXAM: CHEST - 2 VIEW COMPARISON:  Chest radiographs 01/15/2021 and 11/28/2017; CT chest 10/17/2022 FINDINGS: Cardiac silhouette and mediastinal contours are within normal limits. The lungs are clear. No pleural effusion or pneumothorax. No acute skeletal abnormality. IMPRESSION: No active cardiopulmonary disease. Electronically Signed   By: Neita Garnet M.D.   On: 02/10/2023 17:05      Final Assessment and Plan:   Reevaluated at bedside.  Patient had no further emesis episodes over the past 2 hours.  X-ray without free air.  Hemoglobin and BUN are at baseline.  Hemoccult without blood.  Shared medical decision making with patient.  Could consider admissionPreoperatively with observation overnight.  However patient would feel more comfortable at home and is already scheduled an  EGD for tomorrow morning. Patient would prefer to follow-up in the outpatient setting as scheduled and will plan to present back to the hospital if he has interval worsening overnight.   Disposition:  I have considered need for hospitalization, however, considering all of the above, I believe this patient is stable for discharge at this time.  Patient/family educated about specific return precautions for given chief complaint and symptoms.  Patient/family educated about follow-up with PCP .     Patient/family expressed understanding of return precautions and need for follow-up. Patient spoken to regarding all imaging and laboratory results and appropriate follow up for these results. All education provided in verbal form with additional information in written form. Time was allowed for answering of patient questions. Patient discharged.    Emergency Department Medication Summary:   Medications  ondansetron (ZOFRAN) injection 4 mg (has no administration in time range)         Clinical Impression:  1. Hematemesis with nausea      Discharge   Final Clinical Impression(s) / ED Diagnoses Final diagnoses:  Hematemesis with nausea  Rx / DC Orders ED Discharge Orders     None         Glyn Ade, MD 02/10/23 1751

## 2023-02-10 NOTE — ED Triage Notes (Signed)
Patient here POV from Home.  Endorses Weight Loss and Emesis for 3-6 Months. Had a Few episodes of Blood in Emesis in the past hour.   No fevers. No Diarrhea.   NAD Noted during triage. A&Ox4. Gcs 15. Ambulatory.

## 2023-02-10 NOTE — ED Notes (Signed)
Patient wants to hold off Zofran at this time.

## 2023-02-11 ENCOUNTER — Other Ambulatory Visit: Payer: Self-pay

## 2023-02-11 ENCOUNTER — Ambulatory Visit (HOSPITAL_COMMUNITY)
Admission: RE | Admit: 2023-02-11 | Discharge: 2023-02-11 | Disposition: A | Discharge status: Home / Self Care | Payer: BC Managed Care – PPO | Attending: Gastroenterology | Admitting: Gastroenterology

## 2023-02-11 ENCOUNTER — Encounter (HOSPITAL_COMMUNITY): Payer: Self-pay | Admitting: Gastroenterology

## 2023-02-11 ENCOUNTER — Encounter (HOSPITAL_COMMUNITY)
Admission: RE | Disposition: A | Discharge status: Home / Self Care | Payer: Self-pay | Source: Home / Self Care | Attending: Gastroenterology

## 2023-02-11 ENCOUNTER — Ambulatory Visit (HOSPITAL_COMMUNITY): Payer: BC Managed Care – PPO | Admitting: Anesthesiology

## 2023-02-11 DIAGNOSIS — K2950 Unspecified chronic gastritis without bleeding: Secondary | ICD-10-CM | POA: Insufficient documentation

## 2023-02-11 DIAGNOSIS — R634 Abnormal weight loss: Secondary | ICD-10-CM | POA: Insufficient documentation

## 2023-02-11 DIAGNOSIS — R109 Unspecified abdominal pain: Secondary | ICD-10-CM | POA: Diagnosis not present

## 2023-02-11 DIAGNOSIS — Z681 Body mass index (BMI) 19 or less, adult: Secondary | ICD-10-CM | POA: Insufficient documentation

## 2023-02-11 DIAGNOSIS — K2960 Other gastritis without bleeding: Secondary | ICD-10-CM | POA: Diagnosis not present

## 2023-02-11 DIAGNOSIS — K449 Diaphragmatic hernia without obstruction or gangrene: Secondary | ICD-10-CM | POA: Insufficient documentation

## 2023-02-11 DIAGNOSIS — R112 Nausea with vomiting, unspecified: Secondary | ICD-10-CM | POA: Diagnosis not present

## 2023-02-11 HISTORY — PX: ESOPHAGOGASTRODUODENOSCOPY: SHX5428

## 2023-02-11 HISTORY — PX: BIOPSY: SHX5522

## 2023-02-11 SURGERY — EGD (ESOPHAGOGASTRODUODENOSCOPY)
Anesthesia: Monitor Anesthesia Care

## 2023-02-11 MED ORDER — LIDOCAINE VISCOUS HCL 2 % MT SOLN
OROMUCOSAL | Status: DC | PRN
  Administered 2023-02-11: 20 mL via OROMUCOSAL

## 2023-02-11 MED ORDER — LACTATED RINGERS IV SOLN
INTRAVENOUS | Status: DC
  Administered 2023-02-11: 1000 mL via INTRAVENOUS

## 2023-02-11 MED ORDER — LIDOCAINE HCL URETHRAL/MUCOSAL 2 % EX GEL
CUTANEOUS | Status: AC
  Filled 2023-02-11: qty 30

## 2023-02-11 MED ORDER — MIDAZOLAM HCL 2 MG/2ML IJ SOLN
INTRAMUSCULAR | Status: AC
  Filled 2023-02-11: qty 2

## 2023-02-11 MED ORDER — SODIUM CHLORIDE 0.9 % IV SOLN: INTRAVENOUS | Status: DC

## 2023-02-11 MED ORDER — BUTAMBEN-TETRACAINE-BENZOCAINE 2-2-14 % EX AERO
INHALATION_SPRAY | CUTANEOUS | Status: AC
  Filled 2023-02-11: qty 20

## 2023-02-11 MED ORDER — LIDOCAINE VISCOUS HCL 2 % MT SOLN
OROMUCOSAL | Status: AC
  Filled 2023-02-11: qty 15

## 2023-02-11 MED ORDER — BUTAMBEN-TETRACAINE-BENZOCAINE 2-2-14 % EX AERO
INHALATION_SPRAY | CUTANEOUS | Status: DC | PRN
  Administered 2023-02-11: 2 via TOPICAL

## 2023-02-11 NOTE — Progress Notes (Signed)
Manuel [name] 11:01 AM  Subjective: Patient with an episode of hematemesis yesterday and his ER note and labs and x-ray were reviewed and we talked about a Mallory-Weiss tear and again had a long talk about sedation and he has not found a [facility] gastroenterologist  Objective: Vital signs stable afebrile no acute distress exam please see preassessment evaluation yesterday's labs normal  Assessment: Nausea vomiting weight loss questionable etiology  Plan: Will await anesthesia to discuss sedation issues and then happy to proceed as they see fit if he will let us  [facility]  office [phone] After 5PM or if no answer call [phone]

## 2023-02-11 NOTE — Op Note (Signed)
[facility] Patient Name: Manuel [name] Procedure Date: 02/11/2023 MRN: [id number] Attending MD: Vida Rigger , MD, [id number] Date of Birth: [birth year] CSN: [id number] Age: 42 Admit Type: Emergency Department Procedure:                Upper GI endoscopy Indications:              Nausea with vomiting, Weight loss Providers:                Vida Rigger, MD, Glory Rosebush, RN, Beryle Beams,                            Technician Referring MD:              Medicines:                Cetacaine spray, Lidocaine spray Complications:            No immediate complications. Estimated Blood Loss:     Estimated blood loss: none. Procedure:                Pre-Anesthesia Assessment:                           - Prior to the procedure, a History and Physical                            was performed, and patient medications and                            allergies were reviewed. The patient's tolerance of                            previous anesthesia was also reviewed. The risks                            and benefits of the procedure and the sedation                            options and risks were discussed with the patient.                            All questions were answered, and informed consent                            was obtained. Prior Anticoagulants: The patient has                            taken no anticoagulant or antiplatelet agents. ASA                            Grade Assessment: III - A patient with severe                            systemic disease. After reviewing the risks and  benefits, the patient was deemed in satisfactory                            condition to undergo the procedure.                           After obtaining informed consent, the endoscope was                            passed under direct vision. Throughout the                            procedure, the patient's blood pressure, pulse, and                             oxygen saturations were monitored continuously. The                            [id number]-H190 ([id number]) Olympus endoscope was introduced                            through the mouth, and advanced to the third part                            of duodenum. The upper GI endoscopy was                            accomplished without difficulty. The patient                            tolerated the procedure fairly well. Scope In: Scope Out: Findings:      A small hiatal hernia was present. There was questionable a tiny       well-healed Mallory-Weiss tear      Patchy mild inflammation characterized by congestion (edema) and       erythema was found in the entire examined stomach. Biopsies were taken       with a cold forceps for histology.      The duodenal bulb, first portion of the duodenum, second portion of the       duodenum and third portion of the duodenum were normal.      The exam was otherwise without abnormality. Impression:               - Small hiatal hernia.                           - Chronic gastritis. Biopsied.                           - Normal duodenal bulb, first portion of the                            duodenum, second portion of the duodenum and third  portion of the duodenum.                           - The examination was otherwise normal. Moderate Sedation:      Not Applicable - Patient had care per Anesthesia. Recommendation:           - Patient has a contact number available for                            emergencies. The signs and symptoms of potential                            delayed complications were discussed with the                            patient. Return to normal activities tomorrow.                            Written discharge instructions were provided to the                            patient.                           - Soft diet today.                           - Continue present medications.                           -  Await pathology results.                           - Return to GI clinic PRN. He will talk to his [facility]                            doctors about a GI referral at [facility]                           - Telephone GI clinic for pathology results in 1                            week.                           - Telephone GI clinic if symptomatic PRN. Procedure Code(s):        --- Professional ---                           [phone], Esophagogastroduodenoscopy, flexible,                            transoral; with biopsy, single or multiple Diagnosis Code(s):        --- Professional ---  K44.9, Diaphragmatic hernia without obstruction or                            gangrene                           K29.50, Unspecified chronic gastritis without                            bleeding                           R11.2, Nausea with vomiting, unspecified                           R63.4, Abnormal weight loss CPT copyright 2022 American Medical Association. All rights reserved. The codes documented in this report are preliminary and upon coder review may  be revised to meet current compliance requirements. Vida Rigger, MD 02/11/2023 11:42:29 AM This report has been signed electronically. Number of Addenda: 0

## 2023-02-11 NOTE — Anesthesia Preprocedure Evaluation (Deleted)
Anesthesia Evaluation    Airway        Dental   Pulmonary           Cardiovascular   POTS  TTE 09/26/2020: IMPRESSIONS     1. Left ventricular ejection fraction, by estimation, is 65 to 70%. The  left ventricle has normal function. The left ventricle has no regional  wall motion abnormalities. Left ventricular diastolic parameters were  normal.   2. Right ventricular systolic function is normal. The right ventricular  size is normal. There is normal pulmonary artery systolic pressure. The  estimated right ventricular systolic pressure is 29.8 mmHg.   3. The mitral valve is normal in structure. No evidence of mitral valve  regurgitation. No evidence of mitral stenosis.   4. The aortic valve is normal in structure. Aortic valve regurgitation is  not visualized. No aortic stenosis is present.   5. The inferior vena cava is normal in size with greater than 50%  respiratory variability, suggesting right atrial pressure of 3 mmHg.     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, h/o multiple concussions    GI/Hepatic   Endo/Other    Renal/GU      Musculoskeletal   Abdominal   Peds  Hematology   Anesthesia Other Findings   Reproductive/Obstetrics H/o testicular cancer                              Anesthesia Physical Anesthesia Plan  ASA: 2  Anesthesia Plan: MAC   Post-op Pain Management:    Induction: Intravenous  PONV Risk Score and Plan: 1 and Propofol infusion and Treatment may vary due to age or medical condition  Airway Management Planned: Natural Airway and Nasal Cannula  Additional Equipment:   Intra-op Plan:   Post-operative Plan:   Informed Consent:      Dental advisory given  Plan Discussed with: CRNA and Anesthesiologist  Anesthesia Plan Comments: (Discussed with patient risks of MAC including, but not limited to, minor pain or discomfort,  hearing people in the room, and possible need for backup general anesthesia. Risks for general anesthesia also discussed including, but not limited to, sore throat, hoarse voice, chipped/damaged teeth, injury to vocal cords, nausea and vomiting, allergic reactions, lung infection, heart attack, stroke, and death. All questions answered. )         Anesthesia Quick Evaluation

## 2023-02-11 NOTE — Discharge Instructions (Addendum)
YOU HAD AN ENDOSCOPIC PROCEDURE TODAY: Refer to the procedure report and other information in the discharge instructions given to you for any specific questions about what was found during the examination. If this information does not answer your questions, please call [facility] office at [phone] to clarify.   YOU SHOULD EXPECT: Some feelings of bloating in the abdomen. Passage of more gas than usual. Walking can help get rid of the air that was put into your GI tract during the procedure and reduce the bloating. If you had a lower endoscopy (such as a colonoscopy or flexible sigmoidoscopy) you may notice spotting of blood in your stool or on the toilet paper. Some abdominal soreness may be present for a day or two, also.  DIET: Your first meal following the procedure should be a light meal and then it is ok to progress to your normal diet. A half-sandwich or bowl of soup is an example of a good first meal. Heavy or fried foods are harder to digest and may make you feel nauseous or bloated. Drink plenty of fluids but you should avoid alcoholic beverages for 24 hours. If you had a esophageal dilation, please see attached instructions for diet.   ACTIVITY: Your care partner should take you home directly after the procedure. You should plan to take it easy, moving slowly for the rest of the day. You can resume normal activity the day after the procedure however YOU SHOULD NOT DRIVE, use power tools, machinery or perform tasks that involve climbing or major physical exertion for 24 hours (because of the sedation medicines used during the test).   SYMPTOMS TO REPORT IMMEDIATELY: A gastroenterologist can be reached at any hour. Please call [phone]  for any of the following symptoms:  Following lower endoscopy (colonoscopy, flexible sigmoidoscopy) Excessive amounts of blood in the stool  Significant tenderness, worsening of abdominal pains  Swelling of the abdomen that is new, acute  Fever of 100 or  higher  Following upper endoscopy (EGD, EUS, ERCP, esophageal dilation) Vomiting of blood or coffee ground material  New, significant abdominal pain  New, significant chest pain or pain under the shoulder blades  Painful or persistently difficult swallowing  New shortness of breath  Black, tarry-looking or red, bloody stools  FOLLOW UP: Call if GI question or problem and ask one of your [facility] doctors to refer you to a [facility] gastroenterologist to decide what other test or options were medicines trials should be next If any biopsies were taken you will be contacted by phone or by letter within the next 1-3 weeks. Call [phone]  if you have not heard about the biopsies in 3 weeks.  Please also call with any specific questions about appointments or follow up tests.

## 2023-02-14 LAB — SURGICAL PATHOLOGY

## 2023-02-16 ENCOUNTER — Encounter (HOSPITAL_COMMUNITY): Payer: Self-pay | Admitting: Gastroenterology

## 2023-02-20 DIAGNOSIS — K2960 Other gastritis without bleeding: Secondary | ICD-10-CM | POA: Diagnosis not present

## 2023-03-13 DIAGNOSIS — R5383 Other fatigue: Secondary | ICD-10-CM | POA: Diagnosis not present

## 2023-03-13 DIAGNOSIS — M2560 Stiffness of unspecified joint, not elsewhere classified: Secondary | ICD-10-CM | POA: Diagnosis not present

## 2023-03-13 DIAGNOSIS — D8989 Other specified disorders involving the immune mechanism, not elsewhere classified: Secondary | ICD-10-CM | POA: Diagnosis not present

## 2023-03-13 DIAGNOSIS — R61 Generalized hyperhidrosis: Secondary | ICD-10-CM | POA: Diagnosis not present

## 2023-03-13 DIAGNOSIS — R634 Abnormal weight loss: Secondary | ICD-10-CM | POA: Diagnosis not present

## 2023-03-13 DIAGNOSIS — K52831 Collagenous colitis: Secondary | ICD-10-CM | POA: Diagnosis not present

## 2023-04-10 DIAGNOSIS — M720 Palmar fascial fibromatosis [Dupuytren]: Secondary | ICD-10-CM | POA: Diagnosis not present

## 2023-04-28 ENCOUNTER — Encounter: Payer: Self-pay | Admitting: Hematology and Oncology

## 2023-04-29 ENCOUNTER — Telehealth: Payer: Self-pay

## 2023-04-29 ENCOUNTER — Other Ambulatory Visit: Payer: Self-pay | Admitting: Hematology and Oncology

## 2023-04-29 DIAGNOSIS — C6291 Malignant neoplasm of right testis, unspecified whether descended or undescended: Secondary | ICD-10-CM

## 2023-04-29 NOTE — Telephone Encounter (Signed)
Called and given below message. He verbalized understanding and agrees to what Dr. Bertis Ruddy recommends. He said that it is not really rectal pain but numbness. He is seeing GI next week. He is allergic to IV contrast, he prefers no IV contrast. But, he will defer to whatever Dr. Bertis Ruddy recommends in regards with IV contrast.

## 2023-04-29 NOTE — Telephone Encounter (Signed)
Called and given below message and radiology scheduling # to schedule CT. He verbalized understanding and will call to schedule CT.

## 2023-04-29 NOTE — Telephone Encounter (Signed)
-----   Message from Artis Delay sent at 04/29/2023  9:17 AM EDT ----- Please call him later PET CT is not approved test to look for causes of weight loss We have to order Ct first For his anal rectal pain, he needs to reach out to his GI doctor If he agrees with plan, I will place order for CT

## 2023-04-29 NOTE — Telephone Encounter (Signed)
I ordered CT without IV contrast to be done, please give him number to call I will send LOS after it is scheduled

## 2023-05-06 ENCOUNTER — Telehealth: Payer: Self-pay

## 2023-05-06 DIAGNOSIS — K2960 Other gastritis without bleeding: Secondary | ICD-10-CM | POA: Diagnosis not present

## 2023-05-06 NOTE — Telephone Encounter (Signed)
Called and left a message asking him to call radiology scheduling to schedule CT. Left radiolgy scheduling phone #. Ask him to call the office back with questions and once he has CT scheduled.

## 2023-05-07 ENCOUNTER — Ambulatory Visit: Payer: BC Managed Care – PPO | Attending: Internal Medicine | Admitting: Internal Medicine

## 2023-05-07 ENCOUNTER — Encounter: Payer: Self-pay | Admitting: Internal Medicine

## 2023-05-07 VITALS — BP 104/59 | HR 77 | Ht 70.0 in | Wt 143.2 lb

## 2023-05-07 DIAGNOSIS — G90A Postural orthostatic tachycardia syndrome (POTS): Secondary | ICD-10-CM | POA: Diagnosis not present

## 2023-05-07 DIAGNOSIS — Z79899 Other long term (current) drug therapy: Secondary | ICD-10-CM | POA: Diagnosis not present

## 2023-05-07 MED ORDER — FLUDROCORTISONE ACETATE 0.1 MG PO TABS: 0.1000 mg | ORAL_TABLET | Freq: Two times a day (BID) | ORAL | 11 refills | Status: DC

## 2023-05-07 NOTE — Patient Instructions (Addendum)
Medication Instructions:  Your physician has recommended you make the following change in your medication:   Begin Fludrocortisone 0.1mg  - 1 tablet by mouth twice daily  *If you need a refill on your cardiac medications before your next appointment, please call your pharmacy*   Lab Work: BMET in 2 weeks If you have labs (blood work) drawn today and your tests are completely normal, you will receive your results only by: MyChart Message (if you have MyChart) OR A paper copy in the mail If you have any lab test that is abnormal or we need to change your treatment, we will call you to review the results.   Testing/Procedures: None ordered.    Follow-Up: At [facility], you and your health needs are our priority.  As part of our continuing mission to provide you with exceptional heart care, we have created designated Provider Care Teams.  These Care Teams include your primary Cardiologist (physician) and Advanced Practice Providers (APPs -  Physician Assistants and Nurse Practitioners) who all work together to provide you with the care you need, when you need it.  We recommend signing up for the patient portal called "MyChart".  Sign up information is provided on this After Visit Summary.  MyChart is used to connect with patients for Virtual Visits (Telemedicine).  Patients are able to view lab/test results, encounter notes, upcoming appointments, etc.  Non-urgent messages can be sent to your provider as well.   To learn more about what you can do with MyChart, go to [email or web address].    Your next appointment:   6 months with Dr Graciela Husbands

## 2023-05-07 NOTE — Progress Notes (Signed)
CARDIOLOGY OFFICE  NOTE  Patient ID: Manuel [name], MRN: [id number], DOB/AGE: [birth year] 44 y.o. Admit date: (Not on file) Date of Consult: 05/07/2023  Primary Physician: Blair Heys, MD Primary Cardiologist: SK       Chief Complaint:     HPI Manuel [name] is a 42 y.o. male  With hx of testicular cancer associated with abdominal, genital and chest irradiation which predated by about 10 yrs, symptoms of postural and exercise intolerance which appeared relatively abruptly 2017.  When last seen in 2020 he had scant symptoms.  Hydration salt and compressive wear had largely mitigated them.  FAll 2021 recurrence of problems including shower intolerance, difficulties with exercising.  He has had 1 syncopal and 1 presyncopal episode that occurred following showers, associated with a recognizable prodrome of 3 to 5 minutes duration, accompanied by pallor and significant residual fatigue. Associated with high heart rates and low blood pressure.    Given mantle irradiation, a literature highlights the potential issues of autonomic derangements.  I contacted One of the leaders in the field, Dr. Kristine Linea at [facility], who was  willing to see the patient.  An appointment was set up with one of her colleagues, unfortunately it did not happen.     Intra currently hematemesis and Mallory-Weiss tear and diagnosed with collagenous gastritis which seem to be being aggravated by his high sodium intake.  This prompted a need to decrease it and the symptoms which had improved significantly worsened immediately.  He went from being able to drive 45 minutes to an hour without much difficulty to struggling in the car.  The crashing episodes are associated with a spike in heart rate and blood pressure followed by profound hypotension.  Gradually abates over about 30 minutes  He still is able to play tennis although he does so wearing compression.  The patient denies chest pain, shortness of breath,  nocturnal dyspnea, orthopnea or peripheral edema.     Date Cr K Hgb  7/24 1.14 4 14.1           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   Viral meningitis 04/08/2014   Vision changes 10/05/2015      Surgical History:  Past Surgical History:  Procedure Laterality Date   BIOPSY  02/11/2023   Procedure: BIOPSY;  Surgeon: Vida Rigger, MD;  Location: [facility];  Service: Gastroenterology;;   ESOPHAGOGASTRODUODENOSCOPY N/A 02/11/2023   Procedure: ESOPHAGOGASTRODUODENOSCOPY (EGD);  Surgeon: Vida Rigger, MD;  Location: [facility];  Service: Gastroenterology;  Laterality: N/A;   ORCHIECTOMY     ROTATOR CUFF REPAIR     SURGERY SCROTAL / TESTICULAR  2006     Home Meds: Prior to Admission medications   Not on File     Allergies:  Allergies  Allergen Reactions   Codeine Nausea Only and Anaphylaxis    GI upset   Contrast Media [Iodinated Contrast Media] Anaphylaxis   Erythromycin Hives, Itching and Anaphylaxis  Iodine Anaphylaxis    Other reaction(s): anaphylaxis   Shellfish Allergy Anaphylaxis   Doxycycline Nausea And Vomiting   Azithromycin    Corn-Containing Products Nausea And Vomiting   Dilaudid [Hydromorphone Hcl] Other (See Comments)    Nausea   Dilaudid [Hydromorphone]     Other reaction(s): urinary retention   Ioversol    Nsaids Other (See Comments)    Other reaction(s): Palpitations (01/2015)   Tetracyclines & Related    Prednisone Anxiety    Stomach upset      ROS:  Please see the history of present  illness.     All other systems reviewed and negative.  BP (!) 104/59 (Patient Position: Standing)   Pulse 77   Ht 5\' 10"  (1.778 m)   Wt 143 lb 3.2 oz (65 kg)   BMI 20.55 kg/m  Well developed and nourished in no acute distress HENT normal Neck supple with JVP-  flat  Clear Regular rate and rhythm, no murmurs or gallops Abd-soft with active BS No Clubbing cyanosis edema Skin-warm and dry A & Oriented  Grossly normal sensory and motor function  ECG sinus at 71   Assessment and Plan:  POTS in the past  Shower intolerance  Syncope/presyncope  Testicular cancer  Chest abdomen and perineal radiation  Fatigue  Labile blood pressure and raising the question of Baro reflex failure  Collagenous gastritis     Patient's autonomic symptoms were improved with high sodium intake (6-10 g) but developed hematemesis and was found to have collagenous gastritis, which prompted the decreasing of his sodium as it was irritating.  With recurrent symptoms and limited nodule at 3 g of sodium a day.  We discussed the role potentially of fludrocortisone as a salt retaining agent.  He has been intolerant of prednisone in the past with anxiety.  we will begin at 0.1 mg twice daily and check a metabolic profile in 2 weeks.   The crashing episodes associated with increased heart rate and blood pressure suggest either a sympathetic outpouring perhaps triggered by impaired hemodynamic compensation as seen on tilt table testing or perhaps a primary arrhythmia.  He has an Apple watch and we will try and use it to record his heart rhythm during his elevated heart rates      Sherryl Manges

## 2023-05-15 ENCOUNTER — Ambulatory Visit (HOSPITAL_COMMUNITY)
Admission: RE | Admit: 2023-05-15 | Discharge: 2023-05-15 | Disposition: A | Discharge status: Home / Self Care | Payer: BC Managed Care – PPO | Source: Ambulatory Visit | Attending: Hematology and Oncology | Admitting: Hematology and Oncology

## 2023-05-15 DIAGNOSIS — C6291 Malignant neoplasm of right testis, unspecified whether descended or undescended: Secondary | ICD-10-CM | POA: Insufficient documentation

## 2023-05-15 DIAGNOSIS — R634 Abnormal weight loss: Secondary | ICD-10-CM | POA: Diagnosis not present

## 2023-05-20 ENCOUNTER — Inpatient Hospital Stay: Payer: BC Managed Care – PPO | Attending: Hematology and Oncology | Admitting: Hematology and Oncology

## 2023-05-20 ENCOUNTER — Encounter: Payer: Self-pay | Admitting: Hematology and Oncology

## 2023-05-20 VITALS — BP 103/55 | HR 79 | Temp 99.0°F | Resp 18 | Ht 70.0 in

## 2023-05-20 DIAGNOSIS — R1114 Bilious vomiting: Secondary | ICD-10-CM | POA: Diagnosis not present

## 2023-05-20 DIAGNOSIS — C6291 Malignant neoplasm of right testis, unspecified whether descended or undescended: Secondary | ICD-10-CM | POA: Diagnosis not present

## 2023-05-20 DIAGNOSIS — R634 Abnormal weight loss: Secondary | ICD-10-CM | POA: Diagnosis not present

## 2023-05-20 DIAGNOSIS — R112 Nausea with vomiting, unspecified: Secondary | ICD-10-CM | POA: Insufficient documentation

## 2023-05-20 DIAGNOSIS — G90A Postural orthostatic tachycardia syndrome (POTS): Secondary | ICD-10-CM | POA: Diagnosis not present

## 2023-05-20 DIAGNOSIS — K2960 Other gastritis without bleeding: Secondary | ICD-10-CM | POA: Insufficient documentation

## 2023-05-20 DIAGNOSIS — Z8547 Personal history of malignant neoplasm of testis: Secondary | ICD-10-CM | POA: Diagnosis not present

## 2023-05-20 NOTE — Progress Notes (Signed)
[facility] OFFICE PROGRESS NOTE  Patient Care Team: Blair Heys, MD as PCP - General (Family Medicine)  ASSESSMENT & PLAN:  Testicular cancer Repeat imaging study showed no evidence of disease He does not need long-term follow-up  Unexplained weight loss The cause of his weight loss was multifactorial He had extensive GI investigations He is improving with his current medications and increase oral intake His weight loss has resolved He is relieved to know that this is not related to malignancy  Nausea with vomiting This has improved He will continue pantoprazole  No orders of the defined types were placed in this encounter.   All questions were answered. The patient knows to call the clinic with any problems, questions or concerns. The total time spent in the appointment was 20 minutes encounter with patients including review of chart and various tests results, discussions about plan of care and coordination of care plan   Artis Delay, MD 05/20/2023 2:51 PM  INTERVAL HISTORY: Please see below for problem oriented charting. he returns for surveillance follow-up We discussed test results I have reviewed his records extensively The patient had worsening weight loss until he weighed only 127 pounds Subsequently, he was found to have collagenous gastritis as well as POTS syndrome He was placed on appropriate medications and he started eating lots of high-protein intake and he was able to gain almost 20 pounds over the course of the last few weeks He felt better We discussed findings on imaging study and future follow-up  REVIEW OF SYSTEMS:   Constitutional: Denies fevers, chills or abnormal weight loss Eyes: Denies blurriness of vision Ears, nose, mouth, throat, and face: Denies mucositis or sore throat Respiratory: Denies cough, dyspnea or wheezes Cardiovascular: Denies palpitation, chest discomfort or lower extremity swelling Skin: Denies abnormal skin  rashes Lymphatics: Denies new lymphadenopathy or easy bruising Neurological:Denies numbness, tingling or new weaknesses Behavioral/Psych: Mood is stable, no new changes  All other systems were reviewed with the patient and are negative.  I have reviewed the past medical history, past surgical history, social history and family history with the patient and they are unchanged from previous note.  ALLERGIES:  is allergic to codeine, contrast media [iodinated contrast media], erythromycin, iodine, shellfish allergy, doxycycline, azithromycin, corn-containing products, dilaudid [hydromorphone hcl], dilaudid [hydromorphone], ioversol, nsaids, tetracyclines & related, and prednisone.  MEDICATIONS:  Current Outpatient Medications  Medication Sig Dispense Refill   fludrocortisone (FLORINEF) 0.1 MG tablet Take 1 tablet (0.1 mg total) by mouth 2 (two) times daily. 60 tablet 11   ondansetron (ZOFRAN) 8 MG tablet Take 1 tablet (8 mg total) by mouth every 8 (eight) hours as needed for nausea. 30 tablet 3   prochlorperazine (COMPAZINE) 10 MG tablet Take 1 tablet (10 mg total) by mouth every 6 (six) hours as needed for nausea or vomiting. 30 tablet 0   propranolol (INDERAL) 20 MG tablet Take 1 tablet (20 mg total) by mouth as needed (as needed before showering). 45 tablet 0   sodium chloride (OCEAN) 0.65 % SOLN nasal spray Place 1 spray into both nostrils as needed for congestion.     No current facility-administered medications for this visit.    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 He was treated with a radical inguinal orchiectomy by Dr. Barron Alvine and  subsequently received radiation 25.6 Gy in 1.6 Gy fractions 12/12/2004 through 01/02/2005 by Dr. Margaretmary Bayley. Initial HCG tumor marker borderline elevated at 5 units, normal less than 3, with a normal alpha fetoprotein    02/11/2023 Procedure   Findings:      A small hiatal hernia was present. There was questionable a tiny well-healed Mallory-Weiss tear      Patchy mild inflammation characterized by congestion (edema) and erythema was found in the entire examined stomach. Biopsies were taken with a cold forceps for histology.      The duodenal bulb, first portion of the duodenum, second portion of the duodenum and third portion of the duodenum were normal.      The exam was otherwise without abnormality.   05/15/2023 Imaging   CT ABDOMEN PELVIS WO CONTRAST  Result Date: 05/19/2023 CLINICAL DATA:  Follow-up testicular seminoma. Weight loss. * Tracking Code: BO * EXAM: CT ABDOMEN AND PELVIS WITHOUT CONTRAST TECHNIQUE: Multidetector CT imaging of the abdomen and pelvis was performed following the standard protocol without IV contrast. RADIATION DOSE REDUCTION: This exam was performed according to the departmental dose-optimization program which includes automated exposure control, adjustment of the mA and/or kV according to patient size and/or use of iterative reconstruction technique. COMPARISON:  10/17/2022 FINDINGS: Lower chest: No acute findings. Hepatobiliary: No mass visualized on this unenhanced exam. Gallbladder is unremarkable. No evidence of biliary ductal dilatation. Pancreas: No mass or inflammatory process visualized on this unenhanced exam. Spleen:  Within normal limits in size. Adrenals/Urinary tract: No evidence of urolithiasis or hydronephrosis. Unremarkable unopacified urinary bladder. Stomach/Bowel: No evidence of obstruction, inflammatory process, or abnormal fluid collections. Vascular/Lymphatic: No pathologically enlarged lymph nodes identified. No evidence of abdominal aortic aneurysm.  Reproductive: Prior left orchiectomy again noted. No other significant abnormality. Other:  None. Musculoskeletal: No suspicious bone lesions identified. 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 IMPRESSION: Stable exam.  No evidence of metastatic disease. Electronically Signed   By: Danae Orleans M.D.   On: 05/19/2023 16:04        PHYSICAL EXAMINATION: ECOG PERFORMANCE STATUS: 0 - Asymptomatic  Vitals:   05/20/23 1337  BP: (!) 103/55  Pulse: 79  Resp: 18  Temp: 99 F (37.2 C)  SpO2: 100%   There were no vitals filed for this visit.  GENERAL:alert, no distress and comfortable   LABORATORY DATA:  I have reviewed the data as listed    Component Value Date/Time   NA 136 02/10/2023 1539   NA 140 10/16/2015 1525   NA 138 07/23/2013 1513   K 4.0 02/10/2023 1539   K 4.3 07/23/2013 1513   CL 101 02/10/2023 1539   CO2 25 02/10/2023 1539   CO2 24 07/23/2013 1513   GLUCOSE 96 02/10/2023 1539   GLUCOSE 105 07/23/2013 1513   BUN 16 02/10/2023 1539   BUN 20 10/16/2015 1525   BUN 21.1 07/23/2013 1513   CREATININE 0.95 02/10/2023 1539   CREATININE 0.91 10/08/2022 1058   CREATININE 0.9 07/23/2013 1513   CALCIUM 9.4 02/10/2023 1539   CALCIUM 9.7 07/23/2013 1513   PROT 7.0 02/10/2023 1539   PROT 7.4 07/23/2013 1513   ALBUMIN 4.5 02/10/2023 1539   ALBUMIN 4.2 07/23/2013 1513   AST 14 (L) 02/10/2023 1539   AST 14 (L) 10/08/2022 1058   AST 14 07/23/2013  1513   ALT 13 02/10/2023 1539   ALT 13 10/08/2022 1058   ALT 15 07/23/2013 1513   ALKPHOS 68 02/10/2023 1539   ALKPHOS 65 07/23/2013 1513   BILITOT 1.3 (H) 02/10/2023 1539   BILITOT 1.1 10/08/2022 1058   BILITOT 1.02 07/23/2013 1513   GFRNONAA >60 02/10/2023 1539   GFRNONAA >60 10/08/2022 1058   GFRAA >60 04/12/2017 1537    No results found for: "SPEP", "UPEP"  Lab Results  Component Value Date   WBC 9.0 02/10/2023   NEUTROABS 3.6 10/08/2022   HGB 14.1 02/10/2023   HCT 42.2 02/10/2023   MCV  88.1 02/10/2023   PLT 268 02/10/2023      Chemistry      Component Value Date/Time   NA 136 02/10/2023 1539   NA 140 10/16/2015 1525   NA 138 07/23/2013 1513   K 4.0 02/10/2023 1539   K 4.3 07/23/2013 1513   CL 101 02/10/2023 1539   CO2 25 02/10/2023 1539   CO2 24 07/23/2013 1513   BUN 16 02/10/2023 1539   BUN 20 10/16/2015 1525   BUN 21.1 07/23/2013 1513   CREATININE 0.95 02/10/2023 1539   CREATININE 0.91 10/08/2022 1058   CREATININE 0.9 07/23/2013 1513      Component Value Date/Time   CALCIUM 9.4 02/10/2023 1539   CALCIUM 9.7 07/23/2013 1513   ALKPHOS 68 02/10/2023 1539   ALKPHOS 65 07/23/2013 1513   AST 14 (L) 02/10/2023 1539   AST 14 (L) 10/08/2022 1058   AST 14 07/23/2013 1513   ALT 13 02/10/2023 1539   ALT 13 10/08/2022 1058   ALT 15 07/23/2013 1513   BILITOT 1.3 (H) 02/10/2023 1539   BILITOT 1.1 10/08/2022 1058   BILITOT 1.02 07/23/2013 1513       RADIOGRAPHIC STUDIES: I have personally reviewed the radiological images as listed and agreed with the findings in the report. CT ABDOMEN PELVIS WO CONTRAST  Result Date: 05/19/2023 CLINICAL DATA:  Follow-up testicular seminoma. Weight loss. * Tracking Code: BO * EXAM: CT ABDOMEN AND PELVIS WITHOUT CONTRAST TECHNIQUE: Multidetector CT imaging of the abdomen and pelvis was performed following the standard protocol without IV contrast. RADIATION DOSE REDUCTION: This exam was performed according to the departmental dose-optimization program which includes automated exposure control, adjustment of the mA and/or kV according to patient size and/or use of iterative reconstruction technique. COMPARISON:  10/17/2022 FINDINGS: Lower chest: No acute findings. Hepatobiliary: No mass visualized on this unenhanced exam. Gallbladder is unremarkable. No evidence of biliary ductal dilatation. Pancreas: No mass or inflammatory process visualized on this unenhanced exam. Spleen:  Within normal limits in size. Adrenals/Urinary tract: No  evidence of urolithiasis or hydronephrosis. Unremarkable unopacified urinary bladder. Stomach/Bowel: No evidence of obstruction, inflammatory process, or abnormal fluid collections. Vascular/Lymphatic: No pathologically enlarged lymph nodes identified. No evidence of abdominal aortic aneurysm. Reproductive: Prior left orchiectomy again noted. No other significant abnormality. Other:  None. Musculoskeletal: No suspicious bone lesions identified. 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 IMPRESSION: Stable exam.  No evidence of metastatic disease. Electronically Signed   By: Danae Orleans M.D.   On: 05/19/2023 16:04

## 2023-05-20 NOTE — Assessment & Plan Note (Signed)
This has improved He will continue pantoprazole

## 2023-05-20 NOTE — Assessment & Plan Note (Signed)
Repeat imaging study showed no evidence of disease He does not need long-term follow-up

## 2023-05-20 NOTE — Assessment & Plan Note (Signed)
The cause of his weight loss was multifactorial He had extensive GI investigations He is improving with his current medications and increase oral intake His weight loss has resolved He is relieved to know that this is not related to malignancy

## 2023-05-23 ENCOUNTER — Ambulatory Visit: Payer: BC Managed Care – PPO | Attending: Family Medicine

## 2023-05-23 DIAGNOSIS — I781 Nevus, non-neoplastic: Secondary | ICD-10-CM | POA: Diagnosis not present

## 2023-05-23 DIAGNOSIS — L821 Other seborrheic keratosis: Secondary | ICD-10-CM | POA: Diagnosis not present

## 2023-05-23 DIAGNOSIS — L578 Other skin changes due to chronic exposure to nonionizing radiation: Secondary | ICD-10-CM | POA: Diagnosis not present

## 2023-06-17 DIAGNOSIS — D509 Iron deficiency anemia, unspecified: Secondary | ICD-10-CM | POA: Diagnosis not present

## 2023-06-17 DIAGNOSIS — K2960 Other gastritis without bleeding: Secondary | ICD-10-CM | POA: Diagnosis not present

## 2023-06-23 ENCOUNTER — Encounter (HOSPITAL_COMMUNITY): Payer: Self-pay

## 2023-06-23 ENCOUNTER — Emergency Department (HOSPITAL_COMMUNITY): Payer: BC Managed Care – PPO

## 2023-06-23 ENCOUNTER — Emergency Department (HOSPITAL_COMMUNITY)
Admission: EM | Admit: 2023-06-23 | Discharge: 2023-06-23 | Disposition: A | Discharge status: Home / Self Care | Payer: BC Managed Care – PPO | Attending: Emergency Medicine | Admitting: Emergency Medicine

## 2023-06-23 ENCOUNTER — Other Ambulatory Visit: Payer: Self-pay

## 2023-06-23 DIAGNOSIS — H538 Other visual disturbances: Secondary | ICD-10-CM | POA: Diagnosis not present

## 2023-06-23 DIAGNOSIS — G43109 Migraine with aura, not intractable, without status migrainosus: Secondary | ICD-10-CM

## 2023-06-23 DIAGNOSIS — G4489 Other headache syndrome: Secondary | ICD-10-CM | POA: Diagnosis not present

## 2023-06-23 DIAGNOSIS — R519 Headache, unspecified: Secondary | ICD-10-CM | POA: Diagnosis not present

## 2023-06-23 DIAGNOSIS — R202 Paresthesia of skin: Secondary | ICD-10-CM | POA: Diagnosis not present

## 2023-06-23 DIAGNOSIS — G43809 Other migraine, not intractable, without status migrainosus: Secondary | ICD-10-CM | POA: Diagnosis not present

## 2023-06-23 DIAGNOSIS — R42 Dizziness and giddiness: Secondary | ICD-10-CM | POA: Diagnosis not present

## 2023-06-23 DIAGNOSIS — G459 Transient cerebral ischemic attack, unspecified: Secondary | ICD-10-CM | POA: Diagnosis not present

## 2023-06-23 DIAGNOSIS — R9431 Abnormal electrocardiogram [ECG] [EKG]: Secondary | ICD-10-CM | POA: Diagnosis not present

## 2023-06-23 DIAGNOSIS — G43909 Migraine, unspecified, not intractable, without status migrainosus: Secondary | ICD-10-CM | POA: Diagnosis not present

## 2023-06-23 LAB — COMPREHENSIVE METABOLIC PANEL
ALT: 22 U/L (ref 0–44)
AST: 19 U/L (ref 15–41)
Albumin: 3.9 g/dL (ref 3.5–5.0)
Alkaline Phosphatase: 70 U/L (ref 38–126)
Anion gap: 6 (ref 5–15)
BUN: 17 mg/dL (ref 6–20)
CO2: 24 mmol/L (ref 22–32)
Calcium: 8.7 mg/dL — ABNORMAL LOW (ref 8.9–10.3)
Chloride: 106 mmol/L (ref 98–111)
Creatinine, Ser: 1.2 mg/dL (ref 0.61–1.24)
GFR, Estimated: >60 mL/min (ref >60)
Glucose, Bld: 109 mg/dL — ABNORMAL HIGH (ref 70–99)
Potassium: 4.1 mmol/L (ref 3.5–5.1)
Sodium: 136 mmol/L (ref 135–145)
Total Bilirubin: 1.2 mg/dL (ref 0.3–1.2)
Total Protein: 6.8 g/dL (ref 6.5–8.1)

## 2023-06-23 LAB — CBC
HCT: 42 % (ref 39.0–52.0)
Hemoglobin: 13.8 g/dL (ref 13.0–17.0)
MCH: 28.9 pg (ref 26.0–34.0)
MCHC: 32.9 g/dL (ref 30.0–36.0)
MCV: 87.9 fL (ref 80.0–100.0)
Platelets: 267 10*3/uL (ref 150–400)
RBC: 4.78 MIL/uL (ref 4.22–5.81)
RDW: 12.7 % (ref 11.5–15.5)
WBC: 9.8 10*3/uL (ref 4.0–10.5)
nRBC: 0 % (ref 0.0–0.2)

## 2023-06-23 LAB — RAPID URINE DRUG SCREEN, HOSP PERFORMED
Amphetamines: NOT DETECTED
Barbiturates: NOT DETECTED
Benzodiazepines: NOT DETECTED
Cocaine: NOT DETECTED
Opiates: NOT DETECTED
Tetrahydrocannabinol: NOT DETECTED

## 2023-06-23 LAB — URINALYSIS, ROUTINE W REFLEX MICROSCOPIC
Bilirubin Urine: NEGATIVE
Glucose, UA: NEGATIVE mg/dL
Hgb urine dipstick: NEGATIVE
Ketones, ur: NEGATIVE mg/dL
Leukocytes,Ua: NEGATIVE
Nitrite: NEGATIVE
Protein, ur: NEGATIVE mg/dL
Specific Gravity, Urine: 1.003 — ABNORMAL LOW (ref 1.005–1.030)
pH: 6 (ref 5.0–8.0)

## 2023-06-23 LAB — DIFFERENTIAL
Abs Immature Granulocytes: 0.02 10*3/uL (ref 0.00–0.07)
Basophils Absolute: 0.1 10*3/uL (ref 0.0–0.1)
Basophils Relative: 1 %
Eosinophils Absolute: 0.3 10*3/uL (ref 0.0–0.5)
Eosinophils Relative: 3 %
Immature Granulocytes: 0 %
Lymphocytes Relative: 25 %
Lymphs Abs: 2.5 10*3/uL (ref 0.7–4.0)
Monocytes Absolute: 0.6 10*3/uL (ref 0.1–1.0)
Monocytes Relative: 6 %
Neutro Abs: 6.4 10*3/uL (ref 1.7–7.7)
Neutrophils Relative %: 65 %

## 2023-06-23 LAB — PROTIME-INR
INR: 1 (ref 0.8–1.2)
Prothrombin Time: 13.3 s (ref 11.4–15.2)

## 2023-06-23 LAB — CBG MONITORING, ED: Glucose-Capillary: 106 mg/dL — ABNORMAL HIGH (ref 70–99)

## 2023-06-23 LAB — I-STAT CHEM 8, ED
BUN: 19 mg/dL (ref 6–20)
Calcium, Ion: 1.11 mmol/L — ABNORMAL LOW (ref 1.15–1.40)
Chloride: 105 mmol/L (ref 98–111)
Creatinine, Ser: 1.3 mg/dL — ABNORMAL HIGH (ref 0.61–1.24)
Glucose, Bld: 106 mg/dL — ABNORMAL HIGH (ref 70–99)
HCT: 42 % (ref 39.0–52.0)
Hemoglobin: 14.3 g/dL (ref 13.0–17.0)
Potassium: 4 mmol/L (ref 3.5–5.1)
Sodium: 138 mmol/L (ref 135–145)
TCO2: 22 mmol/L (ref 22–32)

## 2023-06-23 LAB — ETHANOL: Alcohol, Ethyl (B): <10 mg/dL (ref <10)

## 2023-06-23 LAB — APTT: aPTT: 25 s (ref 24–36)

## 2023-06-23 MED ORDER — PROCHLORPERAZINE EDISYLATE 10 MG/2ML IJ SOLN
10.0000 mg | Freq: Once | INTRAMUSCULAR | Status: DC
  Filled 2023-06-23: qty 2

## 2023-06-23 NOTE — Consult Note (Signed)
Neurology Consultation  Reason for Consult: Code stroke Referring Physician: Dr. Rubin Payor  CC: Severe frontal constant, sharp, pounding headache with photophobia and phonophobia with associated right arm tingling  History is obtained from: Patient, Chart review  HPI: Manuel [name] is a 42 y.o. 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  Patient states that he was working out at [employer] when he began to feel unwell at 11:45 this morning.  He states he had a sudden onset visual disturbance with lines through his vision prior to the onset of a severe 10/10 headache described as "being hit by a baseball bat" with associated photophobia, phonophobia, tunnel vision with white spots, and nausea.  The patient went home to rest with resolution of his headache but then shortly after being home, his headache recurred this time with right arm tingling as well.  The patient states that with his headache onset, he was having trouble understanding others and was having trouble communicating with others as well.  Patient's headache is slightly improved on arrival with severity 8/10 and less sensitivity to light but his right arm tingling is persistent.    Regarding patient's migraine history, patient states that he gets approximately 1 headache every 3 months that requires him to lay down in a dark room and is typically treated with rest and an ice pack.  Patient states that he feels he had an increase in migraine since he has been put on proton pump inhibitor medication.  Patient last experienced complex  migraine headache was reported to be in 2013 when patient presented with a right temporal headache, left upper and lower extremity weakness, numbness to her right ring and little fingers, numbness of the left upper extremity, left face, and left foot, confusion, visual changes, and some shaking felt to be related to syncope myoclonus versus possible seizure.  EEG was obtained at that time with normal findings without seizures or epileptiform discharges.  LKW: 11:45 AM this morning TNK given?: no, symptoms are felt to be too mild to treat.  Presentation is felt to favor a complex migraine and less likely acute ischemia.  IR Thrombectomy? No, examination not concerning for LVO Modified Rankin Scale: 0-Completely asymptomatic and back to baseline post- stroke  ROS: A complete ROS was performed and is negative except as noted in the HPI.   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   Viral meningitis 04/08/2014   Vision changes 10/05/2015   Past Surgical History:  Procedure Laterality Date   BIOPSY  02/11/2023   Procedure: BIOPSY;  Surgeon: Vida Rigger, MD;  Location: [facility];  Service: Gastroenterology;;   ESOPHAGOGASTRODUODENOSCOPY N/A  02/11/2023   Procedure: ESOPHAGOGASTRODUODENOSCOPY (EGD);  Surgeon: Vida Rigger, MD;  Location: [facility];  Service: Gastroenterology;  Laterality: N/A;   ORCHIECTOMY     ROTATOR CUFF REPAIR     SURGERY SCROTAL / TESTICULAR  2006   Family History  Problem Relation Age of Onset   Migraines Mother     Diabetes type II Father    Social History:   reports that he has never smoked. He has never used smokeless tobacco. He reports that he does not currently use alcohol. He reports that he does not use drugs.  Medications No current facility-administered medications for this encounter.  Current Outpatient Medications:    fludrocortisone (FLORINEF) 0.1 MG tablet, Take 1 tablet (0.1 mg total) by mouth 2 (two) times daily., Disp: 60 tablet, Rfl: 11   ondansetron (ZOFRAN) 8 MG tablet, Take 1 tablet (8 mg total) by mouth every 8 (eight) hours as needed for nausea., Disp: 30 tablet, Rfl: 3   prochlorperazine (COMPAZINE) 10 MG tablet, Take 1 tablet (10 mg total) by mouth every 6 (six) hours as needed for nausea or vomiting., Disp: 30 tablet, Rfl: 0   propranolol (INDERAL) 20 MG tablet, Take 1 tablet (20 mg total) by mouth as needed (as needed before showering)., Disp: 45 tablet, Rfl: 0   sodium chloride (OCEAN) 0.65 % SOLN nasal spray, Place 1 spray into both nostrils as needed for congestion., Disp: , Rfl:   Exam: Current vital signs: Wt 70 kg   BMI 22.14 kg/m  Vital signs in last 24 hours: Weight:  [70 kg] 70 kg (10/21 1600)  GENERAL: Awake, alert, in no acute distress. Seen in EMS stretcher with sunglasses on due to light sensitivity.  Psych: Affect appropriate for situation, patient is calm and cooperative with examination Head: Normocephalic and atraumatic, without obvious abnormality EENT: Normal conjunctivae, dry mucous membranes, no OP obstruction LUNGS: Normal respiratory effort. Non-labored breathing on room air CV: Regular rate and rhythm on telemetry ABDOMEN: Soft, non-tender, non-distended Extremities: Warm, well perfused, without obvious deformity  NEURO:  Mental Status: Awake, alert, and oriented to person, place, time, and situation. He/She is able to provide a clear and coherent history of present illness. Speech/Language: speech is intact without dysarthria.   Naming,  repetition, fluency, and comprehension intact without aphasia  No neglect is noted Cranial Nerves:  II: PERRL. VFF [name], IV, VI: EOMI without gaze preference or diplopia V: Sensation is intact to light touch and symmetrical to face.  VII: Face is symmetric resting and with movement VIII: Hearing is intact to voice IX, X: Palate elevation is symmetric. Phonation normal.  XI: Normal sternocleidomastoid and trapezius muscle strength XII: Tongue protrudes midline without fasciculations.   Motor: 5/5 strength is all muscle groups without unilateral weakness or asymmetry  Tone is normal. Bulk is normal.  Sensation: Tingling reported to right arm.  Sensation to light touch intact symmetric in bilateral lower extremities, sensation to light touch intact in the left upper extremity. Coordination: FTN intact bilaterally. HKS intact bilaterally. No pronator drift. Alternating hand movements.  DTRs: 2+ throughout.  Gait: Deferred  NIHSS: 1 for tingling of the right upper extremity  Labs I have reviewed labs in epic and the results pertinent to this consultation are: CBC    Component Value Date/Time   WBC 9.8 06/23/2023 1619   RBC 4.78 06/23/2023 1619   HGB 14.3 06/23/2023 1626   HGB 14.7 10/08/2022 1058   HGB 14.7 07/23/2013 1513   HCT 42.0 06/23/2023 1626  HCT 45.2 07/23/2013 1513   PLT 267 06/23/2023 1619   PLT 239 10/08/2022 1058   PLT 239 07/23/2013 1513   MCV 87.9 06/23/2023 1619   MCV 89.2 07/23/2013 1513   MCH 28.9 06/23/2023 1619   MCHC 32.9 06/23/2023 1619   RDW 12.7 06/23/2023 1619   RDW 13.1 07/23/2013 1513   LYMPHSABS 2.5 06/23/2023 1619   LYMPHSABS 2.2 07/23/2013 1513   MONOABS 0.6 06/23/2023 1619   MONOABS 0.4 07/23/2013 1513   EOSABS 0.3 06/23/2023 1619   EOSABS 0.3 07/23/2013 1513   BASOSABS 0.1 06/23/2023 1619   BASOSABS 0.1 07/23/2013 1513   CMP     Component Value Date/Time   NA 138 06/23/2023 1626   NA 140 10/16/2015 1525   NA 138 07/23/2013 1513   K  4.0 06/23/2023 1626   K 4.3 07/23/2013 1513   CL 105 06/23/2023 1626   CO2 25 02/10/2023 1539   CO2 24 07/23/2013 1513   GLUCOSE 106 (H) 06/23/2023 1626   GLUCOSE 105 07/23/2013 1513   BUN 19 06/23/2023 1626   BUN 20 10/16/2015 1525   BUN 21.1 07/23/2013 1513   CREATININE 1.30 (H) 06/23/2023 1626   CREATININE 0.91 10/08/2022 1058   CREATININE 0.9 07/23/2013 1513   CALCIUM 9.4 02/10/2023 1539   CALCIUM 9.7 07/23/2013 1513   PROT 7.0 02/10/2023 1539   PROT 7.4 07/23/2013 1513   ALBUMIN 4.5 02/10/2023 1539   ALBUMIN 4.2 07/23/2013 1513   AST 14 (L) 02/10/2023 1539   AST 14 (L) 10/08/2022 1058   AST 14 07/23/2013 1513   ALT 13 02/10/2023 1539   ALT 13 10/08/2022 1058   ALT 15 07/23/2013 1513   ALKPHOS 68 02/10/2023 1539   ALKPHOS 65 07/23/2013 1513   BILITOT 1.3 (H) 02/10/2023 1539   BILITOT 1.1 10/08/2022 1058   BILITOT 1.02 07/23/2013 1513   GFRNONAA >60 02/10/2023 1539   GFRNONAA >60 10/08/2022 1058   GFRAA >60 04/12/2017 1537   Lipid Panel     Component Value Date/Time   CHOL 196 04/24/2012 0700   TRIG 55 04/24/2012 0700   HDL 64 04/24/2012 0700   CHOLHDL 3.1 04/24/2012 0700   VLDL 11 04/24/2012 0700   LDLCALC 121 (H) 04/24/2012 0700   Imaging I have reviewed the images obtained:  CT-scan of the brain 10/21:  No acute intracranial abnormality.  Aspects is 10.  Assessment: 42 y.o. male with PMHx of migraine headaches, anxiety, testicular cancer in remission since 2006, POTS, viral meningitis in 2015, multiple concussions, reactive hypoglycemia episodes, and anxiety who presented to the ED 10/21 for evaluation of a severe frontal headache with preceding visual aura of lines through his vision.  With the onset of his headache, patient also experienced tunnel vision with white spots, difficulty with concentration, understanding, and speaking clearly, photophobia and phonophobia in addition to nausea.  Patient's presentation is felt to be most consistent with a complex  migraine headache with a history of a complex migraine in the past, a typical preceding visual aura at onset with lines through his vision, and with the positive symptom of right arm tingling with the onset of headache.  Headache is described as a 10/10 pounding, severe frontal headache with associated migraine presentation: photophobia, phonophobia, and nausea. Low suspicion for acute ischemia at this time.   Impression: Complex migraine headache   Recommendations: - Favor treating migraine headache - Migraine cocktail with Benadryl 12.5 mg, Reglan 10 mg x 1  - Encourage liberal PO intake  of fluids  - Would not favor further neuroimaging at this time.  If patient's symptoms worsen or are persistent after treating migraine headache, can consider MRI brain though feel that this is unnecessary at this time.   Pt seen by NP/Neuro and later by MD. Note/plan to be edited by MD as needed.  Lanae Boast, AGAC-NP [facility] Pager: [phone]  I have seen the patient reviewed the above note.  He has a very typical presentation with characteristic lines and visual aura, positive symptoms of tingling and characteristic headache.  Especially with a history of complicated migraine, I feel that this is by far the most likely diagnosis.  I would favor treating with a migraine cocktail such as Compazine or Reglan and reassessment following administration of this.  If his symptoms improve, I do not think any further assessment is necessary.  If he continues to have neurological symptoms, could consider an MRI of the brain, though low suspicion at this time.  Ritta Slot, MD [facility] [phone]  If 7pm- 7am, please page neurology on call as listed in AMION.

## 2023-06-23 NOTE — ED Provider Notes (Signed)
[place] EMERGENCY DEPARTMENT AT [facility] Provider Note   CSN: [id number] Arrival date & time: 06/23/23  1617  An emergency department physician performed an initial assessment on this suspected stroke patient at 1618.  History  Chief Complaint  Patient presents with   Migraine   Weakness    Manuel [name] is a 42 y.o. male.   Migraine  Weakness Patient presents with dizziness visual change numbness and tingling right hand.  Began around 1145 then decreased and then returned again.  Has headache.  States he had darkness in his vision on the right eye, and across.  Did have some vertical lines.  History of migraine.  Has had previous complicated migraine.  Met by my neurology myself at the bridge.    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   Viral meningitis 04/08/2014   Vision changes 10/05/2015    Home Medications Prior to Admission medications   Medication Sig Start Date End Date Taking? Authorizing Provider  fludrocortisone (FLORINEF) 0.1 MG tablet Take 1 tablet (0.1 mg total) by mouth 2 (two) times daily. 05/07/23   Duke Salvia, MD  ondansetron (ZOFRAN) 8 MG tablet Take 1 tablet (8 mg total) by mouth every 8 (eight) hours as needed for nausea. 10/08/22   Artis Delay, MD  prochlorperazine (COMPAZINE) 10 MG tablet Take 1 tablet (10 mg total) by mouth every 6 (six) hours as needed for nausea or vomiting. 10/08/22   Artis Delay, MD  propranolol (INDERAL) 20 MG tablet Take 1 tablet (20 mg total) by  mouth as needed (as needed before showering). 07/03/22   Duke Salvia, MD  sodium chloride (OCEAN) 0.65 % SOLN nasal spray Place 1 spray into both nostrils as needed for congestion.    [provider]      Allergies    Codeine, Contrast media [iodinated contrast media], Erythromycin, Iodine, Shellfish allergy, Doxycycline, Azithromycin, Corn-containing products, Dilaudid [hydromorphone hcl], Dilaudid [hydromorphone], Ioversol, Nsaids, Tetracyclines & related, and Prednisone    Review of Systems   Review of Systems  Neurological:  Positive for weakness.    Physical Exam Updated Vital Signs BP 107/65   Pulse 67   Resp 15   Ht 5\' 10"  (1.778 m)   Wt 68 kg   SpO2 100%   BMI 21.52 kg/m  Physical Exam Vitals and nursing note reviewed.  Cardiovascular:     Rate and Rhythm: Normal rate.  Neurological:     Mental Status: He is alert.     Comments: Visual field deficits had resolved.  Paresthesias to right hand.  Complete NIH scoring done by neurology.     ED Results / Procedures / Treatments   Labs (all labs ordered are listed, but only abnormal results are displayed) Labs Reviewed  COMPREHENSIVE METABOLIC PANEL - Abnormal; Notable for the following components:      Result Value   Glucose, Bld 109 (*)    Calcium 8.7 (*)    All  other components within normal limits  URINALYSIS, ROUTINE W REFLEX MICROSCOPIC - Abnormal; Notable for the following components:   Color, Urine COLORLESS (*)    Specific Gravity, Urine 1.003 (*)    All other components within normal limits  I-STAT CHEM 8, ED - Abnormal; Notable for the following components:   Creatinine, Ser 1.30 (*)    Glucose, Bld 106 (*)    Calcium, Ion 1.11 (*)    All other components within normal limits  CBG MONITORING, ED - Abnormal; Notable for the following components:   Glucose-Capillary 106 (*)    All other components within normal limits  ETHANOL  PROTIME-INR  APTT  CBC  DIFFERENTIAL  RAPID URINE DRUG  SCREEN, HOSP PERFORMED    EKG None  Radiology CT HEAD CODE STROKE WO CONTRAST  Result Date: 06/23/2023 CLINICAL DATA:  Code stroke. Right arm tingling, headache and blurred vision. EXAM: CT HEAD WITHOUT CONTRAST TECHNIQUE: Contiguous axial images were obtained from the base of the skull through the vertex without intravenous contrast. RADIATION DOSE REDUCTION: This exam was performed according to the departmental dose-optimization program which includes automated exposure control, adjustment of the mA and/or kV according to patient size and/or use of iterative reconstruction technique. COMPARISON:  MRI of the brain October 08, 2015. FINDINGS: Brain: No evidence of acute infarction, hemorrhage, hydrocephalus, extra-axial collection or mass lesion/mass effect. Vascular: No hyperdense vessel or unexpected calcification. Skull: Normal. Negative for fracture or focal lesion. Sinuses/Orbits: Mild mucosal thickening of ethmoid cells and right maxillary sinus. Other: None. ASPECTS [facility] Stroke Program Early CT Score) - Ganglionic level infarction (caudate, lentiform nuclei, internal capsule, insula, M1-M3 cortex): 7 - Supraganglionic infarction (M4-M6 cortex): 3 Total score (0-10 with 10 being normal): 10 IMPRESSION: 1. No acute intracranial abnormality. 2. Aspects is 10. 3. Mild mucosal thickening of ethmoid cells and right maxillary sinus. These results were communicated by pager system at the time of interpretation on 06/23/2023 at 4:48 pm to Dr. Houston Siren. Electronically Signed   By: Baldemar Lenis M.D.   On: 06/23/2023 16:49    Procedures Procedures    Medications Ordered in ED Medications  prochlorperazine (COMPAZINE) injection 10 mg (10 mg Intravenous Patient Refused/Not Given 06/23/23 1751)    ED Course/ Medical Decision Making/ A&P                                 Medical Decision Making Amount and/or Complexity of Data Reviewed Labs: ordered. Radiology:  ordered.  Risk Prescription drug management.   Patient with focal neurodeficits and headache.  Differential diagnosis includes stroke and complicated migraine.  Also causes such as brain tumor.  Met by myself and neurology.  Initial head CT reassuring.  Thought that it was likely complicated migraine and would not be getting TNK due to small amount of deficits.  Plan to treat migraine.  If headache improves and deficits resolved can go home.  Neurology follow-up.  If continued deficits would get MRI.  On reexam patient feeling much better deficits resolved.  Both vastly improved.  I think most likely complicated migraine.  Can follow-up with Dr. Daisy Blossom, who he is seen previously.  Will discharge home.         Final Clinical Impression(s) / ED Diagnoses Final diagnoses:  Complicated migraine    Rx / DC Orders ED Discharge Orders     None         Benjiman Core, MD  06/23/23 1921  

## 2023-06-23 NOTE — ED Triage Notes (Signed)
PT experiencing dizziness  suddent onset migraine and generalized weakness at 1145, while at gym, s/s alleviated and went home. Around 1345 s/s returned w/ bilateral blindness and r/ side numbness present.  BIB by [facility] with VS: BP 120/62, 99% SpO2, Hr 90, 108 CBG, 18G IV in L AC. PT aox4, no LOC or falls.

## 2023-06-23 NOTE — Code Documentation (Signed)
Manuel [name] is a 42 yr old male with a PMH  of migraine, testicular cancer, anxiety, and collagenous gastritis presenting to [facility] via EMS on 06/23/2023. Pt is coming from home where he was LKW today at 1145. At that time, while exercising, he got a severe h/a with nausea and blurred vision. Symptoms got better then got worse again this afternoon and now his rt arm feels numb. Pt is not on any blood thinners.    Pt met at bridge by stroke team. CBG, labs obtained, airway cleared by EDP. Pt to CT with team. NIHSS 1, for rt sensory loss. The following imaging was obtained: CT. Per Dr. Amada Jupiter, CT is negative for acute hemorrhage. Pt with severe contrast allergy.     Pt returned to ED room 12 where his workup will continue. He will need q 2 hr VS and NIHSS for 12 hours, then q 4. Bedside handoff with ED RN. Pt not candidate for TNK as OOW. He is not eligible for NIR as no LVO symptoms.

## 2023-06-25 ENCOUNTER — Telehealth: Payer: Self-pay | Admitting: Neurology

## 2023-06-25 NOTE — Telephone Encounter (Signed)
Patient called in to schedule a follow up after ER visit on Monday 06/23/23. Stated ER told him he needs to f/u with Dr. Lucia Gaskins to determine if he had a TIA or a complicated migraine, I let him know Dr. Trevor Mace next available right now is not until January. He is concerned because he is still having symptoms from Monday, dizziness, unsteadiness, splitting migraine, deficits in his thought processing. Last seen Dr. Lucia Gaskins 2017 for same. I let him know I would have someone review his chart and advise on urgency and if we can get him worked in. Please advise, thank you

## 2023-06-26 ENCOUNTER — Ambulatory Visit (INDEPENDENT_AMBULATORY_CARE_PROVIDER_SITE_OTHER): Payer: BC Managed Care – PPO | Admitting: Neurology

## 2023-06-26 VITALS — BP 122/71 | HR 82 | Ht 70.0 in | Wt 152.8 lb

## 2023-06-26 DIAGNOSIS — R202 Paresthesia of skin: Secondary | ICD-10-CM

## 2023-06-26 DIAGNOSIS — G43E09 Chronic migraine with aura, not intractable, without status migrainosus: Secondary | ICD-10-CM | POA: Diagnosis not present

## 2023-06-26 DIAGNOSIS — H539 Unspecified visual disturbance: Secondary | ICD-10-CM

## 2023-06-26 MED ORDER — UBRELVY 50 MG PO TABS: ORAL_TABLET | ORAL | 6 refills | Status: DC

## 2023-06-26 NOTE — Progress Notes (Signed)
Chief Complaint  Patient presents with   New Patient (Initial Visit)    Rm15, alone, internal ED referral for complicated migraine vs TIA: vision peripheral cutting out and eventually closed in, spots in right eye missing, right arm numbness/heavy, right leg numbness, gait abnormality, dizziness, felt faint, and a level 10 HA. Er treated him and he felt better. He said during visit had aphasia when hospital asked questions, cognitive impairment during episode.       ASSESSMENT AND PLAN  Manuel [name] is a 42 y.o. male   Chronic migraine  Visual disturbance, transient right-sided symptoms preceding headache, most consistent with complicated migraine, but with his history of testicular cancer, we will proceed with MRI of the brain without contrast  (he worry about the side effect from gadolinium infusion, grandfather has developed scleroderma-like syndrome following gadolinium administration)  Bernita Raisin as needed  DIAGNOSTIC DATA (LABS, IMAGING, TESTING) - I reviewed patient records, labs, notes, testing and imaging myself where available.   MEDICAL HISTORY:  Manuel [name], is a 42 year old male seen in request by emergency for evaluation of visual disturbance, headache, initial evaluation was on June 26, 2023  History is obtained from the patient and review of electronic medical records. I personally reviewed pertinent available imaging films in PACS.   PMHx of  Testicular cancer in 2006, s/p orchiectomy radiation therapy. Collagenous gastritis  Baroreceptor failure  He presented to the emergency room June 23, 2023, symptoms started around 1145 during his workout, he felt dizziness, light sensitivity, low energy, he went to his car drink some water, was able to finish workout, eat lunch, 1 hour later, he began to noticed lightheadedness, dizziness, decreased bilateral peripheral vision, bright spot in his right visual field, blocking his vision, he also complains of  right hand numbness, clumsiness, this is different from his previous migraine  He did develop severe headaches, with light noise sensitivity, he went to sleep woke up next morning, headache is better, but still have some visual concerns, presented to the emergency room CT head without contrast was normal  He had 1 similar episode with prolonged visual disturbance, but not as severe  CT abdomen pelvic in September 2024 without contrast for evaluation of weight loss, history of testicular cancer showed no significant abnormality  He had a history of testicular cancer in 2006, status post orchiectomy, followed by radiation therapy from neck to pelvic region,  He has migraine headaches every couple months, typical migraine are retro-orbital area headache with light noise sensitivity, usually relieved by ice pack, hydration, sleep, she has not taking any prescription medicine for that  He has a history of migraines since teens, remembering middle school go to school nurse because seeing black-white checkbox in his visual field, nauseous, urged to use bathroom, variable degree of headache  Trigger for his migraine smell, strong perfume, light, alcohol, corn,  PHYSICAL EXAM:   Vitals:   06/26/23 1427 06/26/23 1432 06/26/23 1433  BP: 125/63 112/83 122/71  Pulse: 88 90 82  Weight: 152 lb 12.8 oz (69.3 kg)    Height: 5\' 10"  (1.778 m)     Body mass index is 21.92 kg/m.  PHYSICAL EXAMNIATION:  Gen: NAD, conversant, well nourised, well groomed                     Cardiovascular: Regular rate rhythm, no peripheral edema, warm, nontender. Eyes: Conjunctivae clear without exudates or hemorrhage Neck: Supple, no carotid bruits. Pulmonary: Clear to auscultation bilaterally  NEUROLOGICAL EXAM:  MENTAL STATUS: Speech/cognition: Awake, alert, oriented to history taking and casual conversation CRANIAL NERVES: CN II: Visual fields are full to confrontation. Pupils are round equal and briskly  reactive to light. CN [name], IV, VI: extraocular movement are normal. No ptosis. CN V: Facial sensation is intact to light touch CN VII: Face is symmetric with normal eye closure  CN VIII: Hearing is normal to causal conversation. CN IX, X: Phonation is normal. CN XI: Head turning and shoulder shrug are intact  MOTOR: There is no pronator drift of out-stretched arms. Muscle bulk and tone are normal. Muscle strength is normal.  REFLEXES: Reflexes are 2+ and symmetric at the biceps, triceps, knees, and ankles. Plantar responses are flexor.  SENSORY: Intact to light touch, pinprick and vibratory sensation are intact in fingers and toes.  COORDINATION: There is no trunk or limb dysmetria noted.  GAIT/STANCE: Posture is normal. Gait is steady with normal steps, base, arm swing, and turning. Heel and toe walking are normal. Tandem gait is normal.  Romberg is absent.  REVIEW OF SYSTEMS:  Full 14 system review of systems performed and notable only for as above All other review of systems were negative.   ALLERGIES: Allergies  Allergen Reactions   Codeine Nausea Only and Anaphylaxis    GI upset   Contrast Media [Iodinated Contrast Media] Anaphylaxis   Erythromycin Hives, Itching and Anaphylaxis   Iodine Anaphylaxis    Other reaction(s): anaphylaxis   Shellfish Allergy Anaphylaxis   Doxycycline Nausea And Vomiting   Azithromycin    Corn-Containing Products Nausea And Vomiting   Dilaudid [Hydromorphone Hcl] Other (See Comments)    Nausea   Dilaudid [Hydromorphone]     Other reaction(s): urinary retention   Ioversol    Nsaids Other (See Comments)    Other reaction(s): Palpitations (01/2015)   Tetracyclines & Related    Prednisone Anxiety    Stomach upset    HOME MEDICATIONS: Current Outpatient Medications  Medication Sig Dispense Refill   fludrocortisone (FLORINEF) 0.1 MG tablet Take 1 tablet (0.1 mg total) by mouth 2 (two) times daily. 60 tablet 11   ondansetron (ZOFRAN) 8  MG tablet Take 1 tablet (8 mg total) by mouth every 8 (eight) hours as needed for nausea. 30 tablet 3   prochlorperazine (COMPAZINE) 10 MG tablet Take 1 tablet (10 mg total) by mouth every 6 (six) hours as needed for nausea or vomiting. 30 tablet 0   propranolol (INDERAL) 20 MG tablet Take 1 tablet (20 mg total) by mouth as needed (as needed before showering). 45 tablet 0   sodium chloride (OCEAN) 0.65 % SOLN nasal spray Place 1 spray into both nostrils as needed for congestion.     No current facility-administered medications for this visit.    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   Viral meningitis 04/08/2014   Vision changes 10/05/2015    PAST SURGICAL HISTORY: Past Surgical History:  Procedure Laterality Date  BIOPSY  02/11/2023   Procedure: BIOPSY;  Surgeon: Vida Rigger, MD;  Location: [facility];  Service: Gastroenterology;;   ESOPHAGOGASTRODUODENOSCOPY N/A 02/11/2023   Procedure: ESOPHAGOGASTRODUODENOSCOPY (EGD);  Surgeon: Vida Rigger, MD;  Location: [facility];  Service: Gastroenterology;  Laterality: N/A;   ORCHIECTOMY     ROTATOR CUFF REPAIR     SURGERY SCROTAL / TESTICULAR  2006    FAMILY HISTORY: Family History  Problem Relation Age of Onset   Migraines Mother    Diabetes type II Father     SOCIAL HISTORY: Social History   Socioeconomic History   Marital status: Married    Spouse name: Marchelle Folks   Number of children: 2   Years of education: 16   Highest education level: Not on  file  Occupational History   Occupation: Airline pilot  Tobacco Use   Smoking status: Never   Smokeless tobacco: Never  Substance and Sexual Activity   Alcohol use: Not Currently   Drug use: No   Sexual activity: Not on file  Other Topics Concern   Not on file  Social History Narrative   Lives with wife and kids   Caffeine use: none   Social Determinants of Health   Financial Resource Strain: Not on file  Food Insecurity: Not on file  Transportation Needs: Not on file  Physical Activity: Not on file  Stress: Not on file  Social Connections: Not on file  Intimate Partner Violence: Not on file      Levert Feinstein, M.D. Ph.D.  [facility] [address] [place], [place] [address] Ph: [phone] Fax: [phone]  CC:  No referring provider defined for this encounter.  Pcp, No

## 2023-07-17 ENCOUNTER — Telehealth: Payer: Self-pay

## 2023-07-17 NOTE — Telephone Encounter (Signed)
*  [facility]  Pharmacy Patient Advocate Encounter   Received notification from CoverMyMeds that prior authorization for Ubrelvy 50MG  tablets  is required/requested.   Insurance verification completed.   The patient is insured through [facility] .   Per test claim: PA required; PA started via CoverMyMeds. KEY [id number] . Waiting for clinical questions to populate.

## 2023-07-17 NOTE — Telephone Encounter (Signed)
Prior Authorization form/request asks a question that requires your assistance. Please see the question below and advise accordingly.    Has the patient previously tried and failed any triptan medications?*

## 2023-07-21 NOTE — Telephone Encounter (Signed)
He has history of testicular cancer, also described focal motor and sensory symptoms prior to the onset of his headache  He is not a good candidate for triptan treatment

## 2023-07-22 NOTE — Telephone Encounter (Signed)
Clinical questions have been submitted-awaiting determination. 

## 2023-07-25 ENCOUNTER — Other Ambulatory Visit: Payer: Self-pay | Admitting: Internal Medicine

## 2023-07-28 NOTE — Telephone Encounter (Addendum)
Approval received via fax 07/22/23-10/14/23. [facility] notified.

## 2023-08-12 ENCOUNTER — Telehealth: Payer: Self-pay | Admitting: Internal Medicine

## 2023-08-12 NOTE — Telephone Encounter (Signed)
Per Dr Graciela Husbands pt can stop Propranolol 20mg  - 1 tablet as needed prior to showering. No recommendations for med replacement at this time.

## 2023-08-12 NOTE — Telephone Encounter (Signed)
Pt c/o medication issue:  1. Name of Medication:   propranolol (INDERAL) 20 MG tablet   2. How are you currently taking this medication (dosage and times per day)?   As prescribed  3. Are you having a reaction (difficulty breathing--STAT)?   Breathing issue  4. What is your medication issue?   Patient stated he has had some breathing issues while taking this medication and wants to get alternate medication.

## 2023-08-13 MED ORDER — BISOPROLOL FUMARATE 5 MG PO TABS: 2.5000 mg | ORAL_TABLET | Freq: Every day | ORAL | 3 refills | Status: DC

## 2023-08-13 NOTE — Telephone Encounter (Signed)
Pt calling back to f/u on call from yesterday regarding medication. Please advise

## 2023-08-13 NOTE — Telephone Encounter (Signed)
Spoke with patient and he is aware its ok  to stop propranolol 20 mg.   He states he would like an alternate. He states he can not drive, shower or do everyday things without beta blocker and he would like to continue doing his every day activities. He needs an alternate that doesn't effect his lungs.

## 2023-08-13 NOTE — Telephone Encounter (Signed)
Per Dr Graciela Husbands stop Propranolol and start Bisoprolol 5mg  - 1/2 tablet by mouth daily.  Spoke with pt and advised.  Pt agrees with current plan and verbalizes understanding.

## 2023-08-15 ENCOUNTER — Encounter: Payer: Self-pay | Admitting: Internal Medicine

## 2023-08-18 ENCOUNTER — Telehealth: Payer: Self-pay | Admitting: Internal Medicine

## 2023-08-18 MED ORDER — ATENOLOL 25 MG PO TABS: 12.5000 mg | ORAL_TABLET | Freq: Every day | ORAL | 1 refills | Status: DC

## 2023-08-18 NOTE — Telephone Encounter (Signed)
Pt c/o BP issue: STAT if pt c/o blurred vision, one-sided weakness or slurred speech  1. What are your last 5 BP readings? 150/100, 100/50  2. Are you having any other symptoms (ex. Dizziness, headache, blurred vision, passed out)? Dizziness, and nausea   3. What is your BP issue? Pt states that he has been experiencing high and low BP and wants to speak with [facility]. Please advise

## 2023-08-18 NOTE — Telephone Encounter (Signed)
Spoke with pt who requests Bisoprolol 5mg  be changed to Atenolol.  Per Dr Graciela Husbands pt may take Atenolol 25mg  - 1/2 tablet (12.5mg ) by mouth daily as needed for BP and HR management.  Pt verbalizes understanding and agrees with current plan

## 2023-08-20 ENCOUNTER — Emergency Department (HOSPITAL_BASED_OUTPATIENT_CLINIC_OR_DEPARTMENT_OTHER)
Admission: EM | Admit: 2023-08-20 | Discharge: 2023-08-20 | Disposition: A | Discharge status: Home / Self Care | Payer: BC Managed Care – PPO | Attending: Emergency Medicine | Admitting: Emergency Medicine

## 2023-08-20 ENCOUNTER — Other Ambulatory Visit: Payer: Self-pay

## 2023-08-20 ENCOUNTER — Encounter (HOSPITAL_BASED_OUTPATIENT_CLINIC_OR_DEPARTMENT_OTHER): Payer: Self-pay | Admitting: Emergency Medicine

## 2023-08-20 ENCOUNTER — Emergency Department (HOSPITAL_BASED_OUTPATIENT_CLINIC_OR_DEPARTMENT_OTHER): Payer: BC Managed Care – PPO | Admitting: Radiology

## 2023-08-20 DIAGNOSIS — R0789 Other chest pain: Secondary | ICD-10-CM | POA: Diagnosis not present

## 2023-08-20 DIAGNOSIS — Z8547 Personal history of malignant neoplasm of testis: Secondary | ICD-10-CM | POA: Insufficient documentation

## 2023-08-20 DIAGNOSIS — R6883 Chills (without fever): Secondary | ICD-10-CM | POA: Diagnosis not present

## 2023-08-20 DIAGNOSIS — R112 Nausea with vomiting, unspecified: Secondary | ICD-10-CM | POA: Diagnosis not present

## 2023-08-20 DIAGNOSIS — R079 Chest pain, unspecified: Secondary | ICD-10-CM

## 2023-08-20 DIAGNOSIS — R42 Dizziness and giddiness: Secondary | ICD-10-CM | POA: Insufficient documentation

## 2023-08-20 LAB — CBC
HCT: 44.6 % (ref 39.0–52.0)
Hemoglobin: 15 g/dL (ref 13.0–17.0)
MCH: 29.4 pg (ref 26.0–34.0)
MCHC: 33.6 g/dL (ref 30.0–36.0)
MCV: 87.3 fL (ref 80.0–100.0)
Platelets: 278 10*3/uL (ref 150–400)
RBC: 5.11 MIL/uL (ref 4.22–5.81)
RDW: 12.9 % (ref 11.5–15.5)
WBC: 8.8 10*3/uL (ref 4.0–10.5)
nRBC: 0 % (ref 0.0–0.2)

## 2023-08-20 LAB — BASIC METABOLIC PANEL
Anion gap: 13 (ref 5–15)
BUN: 20 mg/dL (ref 6–20)
CO2: 24 mmol/L (ref 22–32)
Calcium: 10 mg/dL (ref 8.9–10.3)
Chloride: 102 mmol/L (ref 98–111)
Creatinine, Ser: 1.07 mg/dL (ref 0.61–1.24)
GFR, Estimated: >60 mL/min (ref >60)
Glucose, Bld: 103 mg/dL — ABNORMAL HIGH (ref 70–99)
Potassium: 3.9 mmol/L (ref 3.5–5.1)
Sodium: 139 mmol/L (ref 135–145)

## 2023-08-20 LAB — TROPONIN I (HIGH SENSITIVITY)
Troponin I (High Sensitivity): 2 ng/L (ref <18)
Troponin I (High Sensitivity): 2 ng/L (ref <18)

## 2023-08-20 MED ORDER — PROCHLORPERAZINE MALEATE 10 MG PO TABS: 10.0000 mg | ORAL_TABLET | Freq: Three times a day (TID) | ORAL | 0 refills | Status: DC | PRN

## 2023-08-20 MED ORDER — PROCHLORPERAZINE EDISYLATE 10 MG/2ML IJ SOLN
10.0000 mg | Freq: Once | INTRAMUSCULAR | Status: DC
Start: 2023-08-20 — End: 2023-08-21
  Filled 2023-08-20: qty 2

## 2023-08-20 NOTE — ED Provider Notes (Signed)
[place] EMERGENCY DEPARTMENT AT [facility] Provider Note   CSN: [id number] Arrival date & time: 08/20/23  1552     History  Chief Complaint  Patient presents with   Chest Pain    Manuel [name] is a 42 y.o. male.  The history is provided by the patient, the spouse and medical records. No language interpreter was used.  Chest Pain    42 year old male significant history of testicular cancer status post radiation treatment, POTS, presenting for evaluation of chest discomfort.  For the past 3 days patient endorses some chest heaviness, lightheadedness, dizziness, nauseous, vomiting, and overall not feeling well.  His blood pressure has been running high as high as 170 systolic.  Today he tries taking a new blood pressure medication, atenolol, that was prescribed by his doctor.  Although it did help with his blood pressure, he feels very nauseous and lightheadedness.  No fever no runny nose sneezing or coughing no focal numbness or focal weakness.  Patient cannot tolerate the Zofran.  His cardiologist is Dr. Graciela Husbands.  Home Medications Prior to Admission medications   Medication Sig Start Date End Date Taking? Authorizing Provider  atenolol (TENORMIN) 25 MG tablet Take 0.5 tablets (12.5 mg total) by mouth daily. 08/18/23   Duke Salvia, MD  fludrocortisone (FLORINEF) 0.1 MG tablet Take 1 tablet (0.1 mg total) by mouth 2 (two) times daily. 05/07/23   Duke Salvia, MD  ondansetron (ZOFRAN) 8 MG tablet Take 1 tablet (8 mg total) by mouth every 8 (eight) hours as needed for nausea. 10/08/22   Artis Delay, MD  prochlorperazine (COMPAZINE) 10 MG tablet Take 1 tablet (10 mg total) by mouth every 6 (six) hours as needed for nausea or vomiting. 10/08/22   Artis Delay, MD  sodium chloride (OCEAN) 0.65 % SOLN nasal spray Place 1 spray into both nostrils as needed for congestion.    [provider]  Ubrogepant (UBRELVY) 50 MG TABS Take 1 tab at onset of migraine.  May repeat  in 2 hrs, if needed.  Max dose: 2 tabs/day. This is a 30 day prescription. 06/26/23   Levert Feinstein, MD      Allergies    Codeine, Contrast media [iodinated contrast media], Erythromycin, Iodine, Shellfish allergy, Doxycycline, Azithromycin, Corn-containing products, Dilaudid [hydromorphone hcl], Dilaudid [hydromorphone], Ioversol, Nsaids, Tetracyclines & related, and Prednisone    Review of Systems   Review of Systems  Cardiovascular:  Positive for chest pain.  All other systems reviewed and are negative.   Physical Exam Updated Vital Signs BP 123/83 (BP Location: Right Arm)   Pulse 73   Temp 99 F (37.2 C) (Temporal)   Resp 20   SpO2 100%  Physical Exam Vitals and nursing note reviewed.  Constitutional:      Appearance: He is well-developed. He is ill-appearing.     Comments: ill in appearance, actively dry heaving.  HENT:     Head: Atraumatic.  Eyes:     Conjunctiva/sclera: Conjunctivae normal.  Cardiovascular:     Rate and Rhythm: Normal rate and regular rhythm.     Pulses: Normal pulses.     Heart sounds: Normal heart sounds.  Pulmonary:     Effort: Pulmonary effort is normal.     Breath sounds: Normal breath sounds. No wheezing, rhonchi or rales.  Abdominal:     Palpations: Abdomen is soft.     Tenderness: There is no abdominal tenderness.  Musculoskeletal:     Cervical back: Neck supple.  Skin:  Findings: No rash.  Neurological:     Mental Status: He is alert. Mental status is at baseline.     ED Results / Procedures / Treatments   Labs (all labs ordered are listed, but only abnormal results are displayed) Labs Reviewed  BASIC METABOLIC PANEL  CBC  TROPONIN I (HIGH SENSITIVITY)    EKG None  Date: 08/20/2023  Rate: 76  Rhythm: normal sinus rhythm  QRS Axis: normal  Intervals: normal  ST/T Wave abnormalities: normal  Conduction Disutrbances: none  Narrative Interpretation:   Old EKG Reviewed: No significant changes noted    Radiology No  results found.  Procedures Procedures    Medications Ordered in ED Medications - No data to display  ED Course/ Medical Decision Making/ A&P                                 Medical Decision Making Amount and/or Complexity of Data Reviewed Labs: ordered. Radiology: ordered.   BP 123/83 (BP Location: Right Arm)   Pulse 73   Temp 99 F (37.2 C) (Temporal)   Resp 20   SpO2 100%   43:20 PM   42 year old male significant history of testicular cancer status post radiation treatment, POTS, presenting for evaluation of chest discomfort.  For the past 3 days patient endorses some chest heaviness, lightheadedness, dizziness, nauseous, vomiting, and overall not feeling well.  His blood pressure has been running high as high as 170 systolic.  Today he tries taking a new blood pressure medication, atenolol, that was prescribed by his doctor.  Although it did help with his blood pressure, he feels very nauseous and lightheadedness.  No fever no runny nose sneezing or coughing no focal numbness or focal weakness.  Patient cannot tolerate the Zofran.  His cardiologist is Dr. Graciela Husbands.  On exam, patient is ill in appearance, dry heaving appears uncomfortable.  Heart with normal rate and rhythm, lungs clear to auscultation abdomen soft nontender strength is equal throughout.  Suspect Dysautonomia  -Labs ordered, independently viewed and interpreted by me.  Labs remarkable for negative delta trop, doubt ACS.  Normal electrolytes panel, normal WBC and H&H -The patient was maintained on a cardiac monitor.  I personally viewed and interpreted the cardiac monitored which showed an underlying rhythm of: NSR -Imaging independently viewed and interpreted by me and I agree with radiologist's interpretation.  Result remarkable for CXR unremarkable -This patient presents to the ED for concern of chest pain, this involves an extensive number of treatment options, and is a complaint that carries with it a high risk  of complications and morbidity.  The differential diagnosis includes dysautonomic syndrome, ACS, GERD, gastritis, PNA, PE -Co morbidities that complicate the patient evaluation includes testicular CA, POTS -Treatment includes compazine -Reevaluation of the patient after these medicines showed that the patient improved -PCP office notes or outside notes reviewed -Escalation to admission/observation considered: patients feels much better, is comfortable with discharge, and will follow up with PCP -Prescription medication considered, patient comfortable with home medication -Social Determinant of Health considered         Final Clinical Impression(s) / ED Diagnoses Final diagnoses:  Nonspecific chest pain    Rx / DC Orders ED Discharge Orders          Ordered    prochlorperazine (COMPAZINE) 10 MG tablet  Every 8 hours PRN        08/20/23 1929  Fayrene Helper, PA-C 08/20/23 1930    Vanetta Mulders, MD 08/22/23 2133

## 2023-08-20 NOTE — Discharge Instructions (Signed)
Your symptoms likely due to a stress crisis causing lightheadedness dizziness nausea and vomiting.  You may take Compazine as needed for nausea and follow-up closely with your doctor for further care.

## 2023-08-20 NOTE — ED Triage Notes (Signed)
Chest pain, chills Nausea and vomiting Started earlier today

## 2023-08-21 ENCOUNTER — Telehealth: Payer: Self-pay | Admitting: Internal Medicine

## 2023-08-21 MED ORDER — ACEBUTOLOL HCL 200 MG PO CAPS: 200.0000 mg | ORAL_CAPSULE | Freq: Two times a day (BID) | ORAL | 1 refills | Status: DC

## 2023-08-21 NOTE — Telephone Encounter (Signed)
Patient called and stated he would like to know if he can take acebutolol being that he took atenolol at 3 am this morning. He stated google stated the half life of atenolol will be in his system for a while.   I tried to explain but he stated he wanted to ask his provider.

## 2023-08-21 NOTE — Telephone Encounter (Signed)
Pt c/o medication issue:  1. Name of Medication:         atenolol (TENORMIN) 25 MG tablet    2. How are you currently taking this medication (dosage and times per day)?  Take 0.5 tablets (12.5 mg total) by mouth daily.       3. Are you having a reaction (difficulty breathing--STAT)? Yes  4. What is your medication issue? Pt is requesting to speak with nurse Mindi Junker regarding this medication not working for him. He started this new medication yesterday and ended up in the ED. Please advise.

## 2023-08-21 NOTE — Telephone Encounter (Signed)
Pt is calling in with complaints of chest heaviness, lightheadedness, dizziness, and flare-up of his asthma. He overall states he is not feeling well.   His blood pressure has been running high as high as 170 systolic which caused him to go to the ER yesterday for evaluation.  Pt states the ER didn't help much yesterday and advised him to follow-up with Dr. Graciela Husbands today.  Pt has a history of POTS and asthma.  He states he was initially started on bisoprolol, and had to stop taking this due to exacerbation of his asthma.    He states Dr. Graciela Husbands then switched him to atenolol to help with POTs and hopefully not exacerbate his asthma, but the pt states he feels exactly the same way as far as breathing and asthma wise, as he did while taking bisoprolol.  He did state however, the atenolol is helping improve his BP readings.   Pt states today he is having what feels like is asthma issues while taking the atenolol but his pressure has improved with last reading this morning at 150/95 HR-67.    Pt is inquiring if there is a medication he can be switched to that will help his pressures and POTS, and want affect his breathing/asthma.   Informed the pt that Dr. Graciela Husbands and Mindi Junker RN are out of the office at this time.  Pt aware that I will go and speak with our Pharmacist and DOD, to see if they can provide further advisement on this matter.   He is aware we will call him back accordingly thereafter.  Pt verbalized understanding and agrees with this plan.

## 2023-08-21 NOTE — Telephone Encounter (Signed)
Went to DOD Dr. Jacinto Halim about pt complaints.   Per Dr. Jacinto Halim, we will discontinue his atenolol and start him on acebutolol 200 mg po bid instead, and see if that helps with his POTS and HTN, while not affecting his asthma.   Pt aware of recommendations and that he will need to stop taking atenolol and start taking acebutolol 200 mg po bid.   Advised him to allow this time to take affect.  Advised him to continue monitoring his symptoms, and if he notices nothing in the next few days, to let us know.  Confirmed the pharmacy of choice with the pt.   Pt verbalized understanding and agrees with this plan.  Will send this message to Dr. Odessa Fleming nurse as an Lorain Childes.

## 2023-08-21 NOTE — Telephone Encounter (Signed)
Pt c/o medication issue:  1. Name of Medication: acebutolol (SECTRAL) 200 MG capsule   2. How are you currently taking this medication (dosage and times per day)? Not currently taking   3. Are you having a reaction (difficulty breathing--STAT)? No   4. What is your medication issue? Patient is calling stating that he read atenolol and acebutolol are not compatible. He is wanting confirmation it is okay to take the acebutolol due to him taking the atenolol at 3:00 am this morning. Please advise.

## 2023-08-22 NOTE — Telephone Encounter (Signed)
Spoke with pt and advised he was to stop Atenolol prior to starting Acebutolol.  Pt states he will try starting Acebutolol today.  Pt encouraged to increase fluids, salt and exercise to help with current POTS flare.  Pt also asking about success of benzodiazepines for POTS.  Pt advised he will need to speak with his PCP regarding adding Benzo medication if the is something he would like to pursue.  Pt verbalizes understanding and thanked Charity fundraiser for the call.

## 2023-08-22 NOTE — Telephone Encounter (Signed)
Patient was to discontinue atenolol before he started acebutolol. Should not take both.   Half life of atenolol is 6-7 hours.

## 2023-09-05 ENCOUNTER — Other Ambulatory Visit: Payer: Self-pay

## 2023-09-05 ENCOUNTER — Ambulatory Visit: Payer: BC Managed Care – PPO | Attending: Internal Medicine | Admitting: Internal Medicine

## 2023-09-05 ENCOUNTER — Encounter: Payer: Self-pay | Admitting: Internal Medicine

## 2023-09-05 VITALS — Ht 70.0 in

## 2023-09-05 DIAGNOSIS — G90A Postural orthostatic tachycardia syndrome (POTS): Secondary | ICD-10-CM

## 2023-09-05 MED ORDER — CLONIDINE HCL 0.1 MG PO TABS: 0.1000 mg | ORAL_TABLET | ORAL | 0 refills | Status: DC | PRN

## 2023-09-05 NOTE — Progress Notes (Signed)
 Electrophysiology TeleHealth Note      Date:  09/05/2023   ID:  LEIBY PIGEON [name], DOB 05/06/1981, MRN [id number]  Location: patient's home  Provider location: [address], [place]  Evaluation Performed: Follow-up visit  PCP:  Pcp, No  Cardiologist:     Electrophysiologist:  SK   Chief Complaint:  dizziness   History of Present Illness:   [facility] The Patients location is their home. Manuel [name] is a 43 y.o. male who presents via audio conferencing for a telehealth visit today.  Since last being seen in our clinic for dysautonomia in the setting of a prior history of testicular cancer with diffuse radiation with difficulties in maintaining blood pressure even over long drives although he is able to play tennis with wearing compression, the patient reports  Doing pretty well exercising 3-5 days/week Collagenous gastritis precluded salt Propranolol  helped him to drive and to recover Developed asthma >> stopped propranolol  even with selective BB, had been quiescient for 30 years>> with discontinuation breathing better; but now with increased HR and then sense of panic; with subsequent BP >> 85/40.  He was taking it as needed>> but then started taking it daily-- when he stopped his BP>>170 and then asthma resolved  Discussed Clonidine  Ivabradine  SGB    The patient denies symptoms of fevers, chills, cough, or new SOB worrisome for COVID 19.    Past Medical History:  Diagnosis Date   Bilateral flank pain 07/17/2012   Complication of lumbar puncture 10/05/2015   Concussion    pt reports he has hx of multiple concussions, last one was 2009   Headache 10/05/2015   Hypotension    Left sided numbness 04/24/2012   Migraine    Nausea without vomiting 10/05/2015   POTS (postural orthostatic tachycardia syndrome)    Seizure (HCC)    Subacute confusional state 10/05/2015   Syncope 04/24/2012   Testicular cancer (HCC) 2006    Traumatic CSF leak 10/05/2015   Viral meningitis 04/08/2014   Vision changes 10/05/2015    Past Surgical History:  Procedure Laterality Date   BIOPSY  02/11/2023   Procedure: BIOPSY;  Surgeon: Rosalie Kitchens, MD;  Location: [facility];  Service: Gastroenterology;;   ESOPHAGOGASTRODUODENOSCOPY N/A 02/11/2023   Procedure: ESOPHAGOGASTRODUODENOSCOPY (EGD);  Surgeon: Rosalie Kitchens, MD;  Location: [facility];  Service: Gastroenterology;  Laterality: N/A;   ORCHIECTOMY     ROTATOR CUFF REPAIR     SURGERY SCROTAL / TESTICULAR  2006    Current Outpatient Medications  Medication Sig Dispense Refill   acebutolol  (SECTRAL ) 200 MG capsule Take 1 capsule (200 mg total) by mouth 2 (two) times daily. 60 capsule 1   fludrocortisone  (FLORINEF ) 0.1 MG tablet Take 1 tablet (0.1 mg total) by mouth 2 (two) times daily. 60 tablet 11   ondansetron  (ZOFRAN ) 8 MG tablet Take 1 tablet (8 mg total) by mouth every 8 (eight) hours as needed for nausea. 30 tablet 3   prochlorperazine  (COMPAZINE ) 10 MG tablet Take 1 tablet (10 mg total) by mouth every 8 (eight) hours as needed for nausea or vomiting. 10 tablet 0   sodium chloride  (OCEAN) 0.65 % SOLN nasal spray Place 1 spray into both nostrils as needed for congestion.     Ubrogepant  (UBRELVY ) 50 MG TABS Take 1 tab at onset of migraine.  May repeat in 2 hrs, if needed.  Max dose: 2 tabs/day. This is a 30 day prescription. 12 tablet 6  No current facility-administered medications for this visit.    Allergies:   Codeine, Contrast media [iodinated contrast media], Erythromycin, Iodine, Shellfish allergy, Doxycycline , Azithromycin, Corn-containing products, Dilaudid [hydromorphone hcl], Dilaudid [hydromorphone], Ioversol, Nsaids, Tetracyclines & related, and Prednisone    ROS:  Please see the history of present illness.   All other systems are personally reviewed and negative.    Exam:     Vital Signs:  Ht 5' 10 (1.778 m)   BMI 21.92 kg/m      Labs/Other Tests and Data  Reviewed:    Recent Labs: 06/23/2023: ALT 22 08/20/2023: BUN 20; Creatinine, Ser 1.07; Hemoglobin 15.0; Platelets 278; Potassium 3.9; Sodium 139   Wt Readings from Last 3 Encounters:  06/26/23 152 lb 12.8 oz (69.3 kg)  06/23/23 150 lb (68 kg)  05/07/23 143 lb 3.2 oz (65 kg)        ASSESSMENT & PLAN:   POTS  in past  Shower intolerance   Syncope/presyncope   Testicular cancer   Chest abdomen and perineal radiation   Fatigue   Labile blood pressure and raising the question of Baro reflex failure   Collagenous gastritis  Asthma induced by betablockers  Discussed as above Clonidine   ? Pheo Will do 24 hrs urine>> with adrenal abnormality on remote CT scan    Follow-up:  telehealth visit  6-8 weeks     Current medicines are reviewed at length with the patient today.   The patient has concerns regarding his medicines.  The following changes were made today:  clonidine  0.1  take as needed #30  Labs/ tests ordered today include: 24 hr for metanephines and VMAs No orders of the defined types were placed in this encounter.     Today, I have spent 29 minutes with the patient with telehealth technology discussing the above.  Signed, Elspeth Sage, MD  09/05/2023 5:39 PM     [facility] [address] [place] [address] [phone] (office) [phone] (fax)

## 2023-09-05 NOTE — Telephone Encounter (Signed)
 ..  Pt understands that although there may be some limitations with this type of visit, we will take all precautions to reduce any security or privacy concerns.  Pt understands that this will be treated like an in office visit and we will file with pt's insurance, and there may be a patient responsible charge related to this service. ? ?

## 2023-09-05 NOTE — Patient Instructions (Signed)
 Medication Instructions:  Your physician has recommended you make the following change in your medication:   ** Begin Clonidine  0.1mg  - 1 tablet by mouth as needed  *If you need a refill on your cardiac medications before your next appointment, please call your pharmacy*   Lab Work: 24 hour urine - Pick up supplies from any [facility] in your area.  I have the put the order in for [facility]. If you have labs (blood work) drawn today and your tests are completely normal, you will receive your results only by: MyChart Message (if you have MyChart) OR A paper copy in the mail If you have any lab test that is abnormal or we need to change your treatment, we will call you to review the results.   Testing/Procedures: None ordered.    Follow-Up: At [facility], you and your health needs are our priority.  As part of our continuing mission to provide you with exceptional heart care, we have created designated Provider Care Teams.  These Care Teams include your primary Cardiologist (physician) and Advanced Practice Providers (APPs -  Physician Assistants and Nurse Practitioners) who all work together to provide you with the care you need, when you need it.  We recommend signing up for the patient portal called MyChart.  Sign up information is provided on this After Visit Summary.  MyChart is used to connect with patients for Virtual Visits (Telemedicine).  Patients are able to view lab/test results, encounter notes, upcoming appointments, etc.  Non-urgent messages can be sent to your provider as well.   To learn more about what you can do with MyChart, go to [email or web address].    Your next appointment:   2-3 months with Dr Fernande - I will have his scheduler contact you to schedule this appointment.

## 2023-09-16 DIAGNOSIS — Z0000 Encounter for general adult medical examination without abnormal findings: Secondary | ICD-10-CM | POA: Diagnosis not present

## 2023-09-16 DIAGNOSIS — Z13220 Encounter for screening for lipoid disorders: Secondary | ICD-10-CM | POA: Diagnosis not present

## 2023-09-16 DIAGNOSIS — K2960 Other gastritis without bleeding: Secondary | ICD-10-CM | POA: Diagnosis not present

## 2023-09-16 DIAGNOSIS — Z1330 Encounter for screening examination for mental health and behavioral disorders, unspecified: Secondary | ICD-10-CM | POA: Diagnosis not present

## 2023-09-16 DIAGNOSIS — G90A Postural orthostatic tachycardia syndrome (POTS): Secondary | ICD-10-CM | POA: Diagnosis not present

## 2023-09-16 DIAGNOSIS — Z8547 Personal history of malignant neoplasm of testis: Secondary | ICD-10-CM | POA: Diagnosis not present

## 2023-09-23 DIAGNOSIS — G90A Postural orthostatic tachycardia syndrome (POTS): Secondary | ICD-10-CM | POA: Diagnosis not present

## 2023-09-29 LAB — METANEPHRINES, URINE, 24 HOUR
Metaneph Total, Ur: 104 ug/L
Metanephrines, 24H Ur: 156 ug/(24.h) (ref 58–276)
Normetanephrine, 24H Ur: 159 ug/(24.h) (ref 156–729)
Normetanephrine, Ur: 106 ug/L

## 2023-10-05 ENCOUNTER — Other Ambulatory Visit: Payer: Self-pay | Admitting: Internal Medicine

## 2023-10-08 NOTE — Telephone Encounter (Signed)
 Not entirely sure   could be junctional rhythm a competetive rhtyhm from the AV junction Lets continue to monitor The puase also hard to tell, but most common thing is an extra beat from the top  Hard to see Thanks

## 2023-10-10 ENCOUNTER — Other Ambulatory Visit: Payer: Self-pay

## 2023-10-10 ENCOUNTER — Emergency Department (HOSPITAL_BASED_OUTPATIENT_CLINIC_OR_DEPARTMENT_OTHER)
Admission: EM | Admit: 2023-10-10 | Discharge: 2023-10-10 | Disposition: A | Discharge status: Home / Self Care | Payer: BC Managed Care – PPO | Attending: Emergency Medicine | Admitting: Emergency Medicine

## 2023-10-10 ENCOUNTER — Emergency Department (HOSPITAL_BASED_OUTPATIENT_CLINIC_OR_DEPARTMENT_OTHER): Payer: BC Managed Care – PPO | Admitting: Radiology

## 2023-10-10 DIAGNOSIS — Z8547 Personal history of malignant neoplasm of testis: Secondary | ICD-10-CM | POA: Insufficient documentation

## 2023-10-10 DIAGNOSIS — R079 Chest pain, unspecified: Secondary | ICD-10-CM | POA: Diagnosis not present

## 2023-10-10 DIAGNOSIS — I493 Ventricular premature depolarization: Secondary | ICD-10-CM | POA: Insufficient documentation

## 2023-10-10 DIAGNOSIS — R0789 Other chest pain: Secondary | ICD-10-CM | POA: Diagnosis not present

## 2023-10-10 LAB — BASIC METABOLIC PANEL
Anion gap: 10 (ref 5–15)
BUN: 18 mg/dL (ref 6–20)
CO2: 26 mmol/L (ref 22–32)
Calcium: 9.6 mg/dL (ref 8.9–10.3)
Chloride: 101 mmol/L (ref 98–111)
Creatinine, Ser: 1.04 mg/dL (ref 0.61–1.24)
GFR, Estimated: >60 mL/min (ref >60)
Glucose, Bld: 93 mg/dL (ref 70–99)
Potassium: 3.6 mmol/L (ref 3.5–5.1)
Sodium: 137 mmol/L (ref 135–145)

## 2023-10-10 LAB — CBC
HCT: 44.8 % (ref 39.0–52.0)
Hemoglobin: 15.4 g/dL (ref 13.0–17.0)
MCH: 30 pg (ref 26.0–34.0)
MCHC: 34.4 g/dL (ref 30.0–36.0)
MCV: 87.2 fL (ref 80.0–100.0)
Platelets: 310 10*3/uL (ref 150–400)
RBC: 5.14 MIL/uL (ref 4.22–5.81)
RDW: 12.3 % (ref 11.5–15.5)
WBC: 8.4 10*3/uL (ref 4.0–10.5)
nRBC: 0 % (ref 0.0–0.2)

## 2023-10-10 LAB — TROPONIN I (HIGH SENSITIVITY)
Troponin I (High Sensitivity): <2 ng/L (ref <18)
Troponin I (High Sensitivity): 2 ng/L (ref <18)

## 2023-10-10 LAB — TSH: TSH: 1.792 u[IU]/mL (ref 0.350–4.500)

## 2023-10-10 NOTE — ED Triage Notes (Signed)
 Pt POV reporting mid chest pressure that radiates out to both sides and to both shoulders. EKG on watch showing PVCs. Sent EKG to cardiologist, advised to come to ED if pain persists. Hx anxiety.

## 2023-10-10 NOTE — ED Provider Notes (Signed)
 [place] EMERGENCY DEPARTMENT AT [facility] Provider Note   CSN: [id number] Arrival date & time: 10/10/23  1716     History  Chief Complaint  Patient presents with   Chest Pain    BANNER Manuel [name] is a 43 y.o. male.  Pt is a 42y/o male with hx of dysautonomia in the setting of a prior history of testicular cancer with diffuse radiation with difficulties in maintaining blood pressure, ongoing issues with ectopic beats and intolerance to betablockers, patient is presenting today due to intermittent chest pain that has been present more significantly over the last 2 days.  He also notes shortness of breath with walking.  He reports that the pain is associated with extra beats which he is seeing on his watch and phone when he is monitoring.  He sent these readings to his cardiologist Dr. Fernande as well as an EKG and he recommended he come to the emergency room for evaluation.  Patient reports he has not had a cough or congestion.  He denies recent travel, leg swelling or prior history of PEs.  He does have a history of asthma for which he cannot be on propranolol  but reports that this does not feel like an asthma attack and he has not noticed any wheezing.  He has not started any new medications recently and denies any syncope.  No abdominal pain or vomiting.  He did report some aching in his right arm today when he would have these intermittent episodes which are not related to exertion, eating or taking a deep breath.  The history is provided by the patient and medical records.  Chest Pain      Home Medications Prior to Admission medications   Medication Sig Start Date End Date Taking? Authorizing Provider  acebutolol  (SECTRAL ) 200 MG capsule Take 1 capsule (200 mg total) by mouth 2 (two) times daily. 08/21/23   Ladona Heinz, MD  cloNIDine  (CATAPRES ) 0.1 MG tablet TAKE 1 TABLET BY MOUTH DAILY AS NEEDED 10/06/23   Fernande Elspeth BROCKS, MD  fludrocortisone  (FLORINEF ) 0.1 MG tablet Take  1 tablet (0.1 mg total) by mouth 2 (two) times daily. 05/07/23   Fernande Elspeth BROCKS, MD  ondansetron  (ZOFRAN ) 8 MG tablet Take 1 tablet (8 mg total) by mouth every 8 (eight) hours as needed for nausea. 10/08/22   Lonn Hicks, MD  prochlorperazine  (COMPAZINE ) 10 MG tablet Take 1 tablet (10 mg total) by mouth every 8 (eight) hours as needed for nausea or vomiting. 08/20/23   Nivia Colon, PA-C  sodium chloride  (OCEAN) 0.65 % SOLN nasal spray Place 1 spray into both nostrils as needed for congestion.    [provider]  Ubrogepant  (UBRELVY ) 50 MG TABS Take 1 tab at onset of migraine.  May repeat in 2 hrs, if needed.  Max dose: 2 tabs/day. This is a 30 day prescription. 06/26/23   Onita Duos, MD      Allergies    Codeine, Contrast media [iodinated contrast media], Erythromycin, Iodine, Shellfish allergy, Doxycycline , Azithromycin, Corn-containing products, Dilaudid [hydromorphone hcl], Dilaudid [hydromorphone], Ioversol, Nsaids, Tetracyclines & related, and Prednisone     Review of Systems   Review of Systems  Cardiovascular:  Positive for chest pain.    Physical Exam Updated Vital Signs BP 117/69   Pulse 66   Temp 98.2 F (36.8 C) (Oral)   Resp 20   Ht 5' 10 (1.778 m)   Wt 70.3 kg   SpO2 100%   BMI 22.24 kg/m  Physical  Exam Vitals and nursing note reviewed.  Constitutional:      General: He is not in acute distress.    Appearance: He is well-developed.  HENT:     Head: Normocephalic and atraumatic.  Eyes:     Conjunctiva/sclera: Conjunctivae normal.     Pupils: Pupils are equal, round, and reactive to light.  Cardiovascular:     Rate and Rhythm: Normal rate and regular rhythm.     Pulses: Normal pulses.     Heart sounds: Normal heart sounds. No murmur heard. Pulmonary:     Effort: Pulmonary effort is normal. No respiratory distress.     Breath sounds: Normal breath sounds. No wheezing or rales.  Abdominal:     General: There is no distension.     Palpations: Abdomen is  soft.     Tenderness: There is no abdominal tenderness. There is no guarding or rebound.  Musculoskeletal:        General: No tenderness. Normal range of motion.     Cervical back: Normal range of motion and neck supple.     Right lower leg: No edema.     Left lower leg: No edema.  Skin:    General: Skin is warm and dry.     Findings: No erythema or rash.  Neurological:     Mental Status: He is alert and oriented to person, place, and time. Mental status is at baseline.  Psychiatric:        Behavior: Behavior normal.     ED Results / Procedures / Treatments   Labs (all labs ordered are listed, but only abnormal results are displayed) Labs Reviewed  CBC  BASIC METABOLIC PANEL  TSH  TROPONIN I (HIGH SENSITIVITY)  TROPONIN I (HIGH SENSITIVITY)    EKG EKG Interpretation Date/Time:  Friday October 10 2023 17:34:47 EST Ventricular Rate:  79 PR Interval:  138 QRS Duration:  94 QT Interval:  380 QTC Calculation: 435 R Axis:   87  Text Interpretation: Normal sinus rhythm Normal ECG When compared with ECG of 20-Aug-2023 16:05, No significant change was found Confirmed by Doretha Folks ([id number]) on 10/10/2023 6:49:31 PM  Radiology DG Chest 2 View Result Date: 10/10/2023 CLINICAL DATA:  Chest pain EXAM: CHEST - 2 VIEW COMPARISON:  Chest x-ray 08/20/2023 FINDINGS: The heart size and mediastinal contours are within normal limits. Both lungs are clear. The visualized skeletal structures are unremarkable. IMPRESSION: No active cardiopulmonary disease. Electronically Signed   By: Greig Pique M.D.   On: 10/10/2023 18:38    Procedures Procedures    Medications Ordered in ED Medications - No data to display  ED Course/ Medical Decision Making/ A&P                                 Medical Decision Making Amount and/or Complexity of Data Reviewed External Data Reviewed: notes. Labs: ordered. Decision-making details documented in ED Course. Radiology: ordered and independent  interpretation performed. Decision-making details documented in ED Course. ECG/medicine tests: ordered and independent interpretation performed. Decision-making details documented in ED Course.   Pt with multiple medical problems and comorbidities and presenting today with a complaint that caries a high risk for morbidity and mortality.  Here today with complaint of chest pain.  It seems that his chest pain is associated with PVCs which she is catching when he is watching his cardiac monitoring on his watch.  He showed me the tracings of these  and he does appear to have occasional PVCs.  He reports that is when he gets the pain.  It is not exertional in nature.  He is not having symptoms suggestive of CHF.  I independently interpreted patient's EKG and labs.  EKG is normal today without evidence of dysrhythmia.  CBC, troponin, BMP are within normal limits.  He has no infectious symptoms concerning for pneumonia.  Low suspicion for pericarditis, myocarditis or cardiomyopathy at this time.  PERC negative. I have independently visualized and interpreted pt's images today.  Chest x-ray without acute findings.  TSH was since because he has not had 1 done in quite some time.  Discussed the findings with the patient.  Vital signs are normal here.  At this time feel that he is stable for discharge home.          Final Clinical Impression(s) / ED Diagnoses Final diagnoses:  Chest pain, unspecified type  PVC's (premature ventricular contractions)    Rx / DC Orders ED Discharge Orders     None         Doretha Folks, MD 10/10/23 2040

## 2023-10-10 NOTE — Discharge Instructions (Signed)
 The blood test today look good with no sign of any heart damage.  The x-ray shows no evidence of anything in your lungs.  It will be important follow-up with a cardiologist in the future see if they can figure out why you continue to get these.

## 2023-10-13 ENCOUNTER — Encounter: Payer: Self-pay | Admitting: Internal Medicine

## 2023-10-16 ENCOUNTER — Ambulatory Visit: Payer: BC Managed Care – PPO | Admitting: Internal Medicine

## 2023-10-20 ENCOUNTER — Other Ambulatory Visit (HOSPITAL_COMMUNITY): Payer: Self-pay

## 2023-10-20 ENCOUNTER — Other Ambulatory Visit: Payer: Self-pay

## 2023-10-20 ENCOUNTER — Telehealth: Payer: Self-pay

## 2023-10-20 MED ORDER — ACEBUTOLOL HCL 200 MG PO CAPS
200.0000 mg | ORAL_CAPSULE | Freq: Two times a day (BID) | ORAL | 0 refills | Status: DC
Start: 2023-10-20 — End: 2023-11-24

## 2023-10-20 NOTE — Telephone Encounter (Signed)
 Pharmacy Patient Advocate Encounter   Received notification from Fax that prior authorization for Ubrelvy 50MG  tablets  is required/requested.   Insurance verification completed.   The patient is insured through [employer] .   Per test claim: PA required; PA submitted to above mentioned insurance via CoverMyMeds Key/confirmation #/EOC [id number] Status is pending

## 2023-10-21 ENCOUNTER — Other Ambulatory Visit (HOSPITAL_COMMUNITY): Payer: Self-pay

## 2023-10-21 ENCOUNTER — Telehealth: Payer: Self-pay | Admitting: Internal Medicine

## 2023-10-21 DIAGNOSIS — D509 Iron deficiency anemia, unspecified: Secondary | ICD-10-CM | POA: Diagnosis not present

## 2023-10-21 DIAGNOSIS — K2960 Other gastritis without bleeding: Secondary | ICD-10-CM | POA: Diagnosis not present

## 2023-10-21 NOTE — Telephone Encounter (Signed)
Unable to reach pt, LMTCB.

## 2023-10-21 NOTE — Telephone Encounter (Signed)
   Pt c/o of Chest Pain: STAT if active CP, including tightness, pressure, jaw pain, radiating pain to shoulder/upper arm/back, CP unrelieved by Nitro. Symptoms reported of SOB, nausea, vomiting, sweating.  1. Are you having CP right now? Yes     2. Are you experiencing any other symptoms (ex. SOB, nausea, vomiting, sweating)? Headaches, light headed and dizzy   3. Is your CP continuous or coming and going? Comes and goes   4. Have you taken Nitroglycerin? No    5. How long have you been experiencing CP? A month   6. If NO CP at time of call then end call with telling Pt to call back or call 911 if Chest pain returns prior to return call from triage team.

## 2023-10-21 NOTE — Telephone Encounter (Signed)
 Pt would like to be seen sooner than 10/28/2023 for the ongoing CP and he also is c/o Right Arm, fingertip, face, cheek numbness; these are all the same symptoms he had last week when going to the ER.  He is very concerned about the CP and other associated symptoms. Will change appt from 10/28/2023 (w/Ursuy, PA) to 10/22/2023 at 11:00 am with Tobias, [place]. Pt verbalized understanding.

## 2023-10-21 NOTE — Telephone Encounter (Signed)
 Patient returned call

## 2023-10-21 NOTE — Telephone Encounter (Signed)
 Pharmacy Patient Advocate Encounter  Received notification from [facility] that Prior Authorization for Ubrelvy 50MG  tablets has been APPROVED from 10/21/2023 to 10/19/2024. Ran test claim, Copay is $0.00. This test claim was processed through [facility]- copay amounts may vary at other pharmacies due to pharmacy/plan contracts, or as the patient moves through the different stages of their insurance plan.

## 2023-10-22 ENCOUNTER — Ambulatory Visit: Payer: BC Managed Care – PPO | Attending: Student | Admitting: Student

## 2023-10-22 ENCOUNTER — Encounter: Payer: Self-pay | Admitting: Student

## 2023-10-22 VITALS — BP 115/69 | HR 86 | Ht 70.0 in | Wt 146.0 lb

## 2023-10-22 DIAGNOSIS — R002 Palpitations: Secondary | ICD-10-CM | POA: Diagnosis not present

## 2023-10-22 DIAGNOSIS — R0789 Other chest pain: Secondary | ICD-10-CM

## 2023-10-22 DIAGNOSIS — Z79899 Other long term (current) drug therapy: Secondary | ICD-10-CM | POA: Diagnosis not present

## 2023-10-22 DIAGNOSIS — G90A Postural orthostatic tachycardia syndrome (POTS): Secondary | ICD-10-CM

## 2023-10-22 DIAGNOSIS — I429 Cardiomyopathy, unspecified: Secondary | ICD-10-CM

## 2023-10-22 NOTE — Patient Instructions (Signed)
 Medication Instructions:  Your physician recommends that you continue on your current medications as directed. Please refer to the Current Medication list given to you today.  *If you need a refill on your cardiac medications before your next appointment, please call your pharmacy*  Lab Work: None ordered If you have labs (blood work) drawn today and your tests are completely normal, you will receive your results only by: MyChart Message (if you have MyChart) OR A paper copy in the mail If you have any lab test that is abnormal or we need to change your treatment, we will call you to review the results.   Testing/Procedures: Your physician has requested that you have a Coronary Calcium score which is completed by CT. Cardiac computed tomography (CT) is a painless test that uses an x-ray machine to take clear, detailed pictures of your heart. There are no instructions for this testing.  You may eat/drink and take your normal medications this day.  The cost of the testing is $99 due at the time of your appointment.  Your physician has requested that you have an echocardiogram. Echocardiography is a painless test that uses sound waves to create images of your heart. It provides your doctor with information about the size and shape of your heart and how well your heart's chambers and valves are working. This procedure takes approximately one hour. There are no restrictions for this procedure. Please do NOT wear cologne, perfume, aftershave, or lotions (deodorant is allowed). Please arrive 15 minutes prior to your appointment time.  Please note: We ask at that you not bring children with you during ultrasound (echo/ vascular) testing. Due to room size and safety concerns, children are not allowed in the ultrasound rooms during exams. Our front office staff cannot provide observation of children in our lobby area while testing is being conducted. An adult accompanying a patient to their appointment will  only be allowed in the ultrasound room at the discretion of the ultrasound technician under special circumstances. We apologize for any inconvenience.   Follow-Up: At [facility], you and your health needs are our priority.  As part of our continuing mission to provide you with exceptional heart care, we have created designated Provider Care Teams.  These Care Teams include your primary Cardiologist (physician) and Advanced Practice Providers (APPs -  Physician Assistants and Nurse Practitioners) who all work together to provide you with the care you need, when you need it.  Your next appointment:   6-8 week(s)  Provider:   Casimiro Needle "Otilio Saber, PA-C

## 2023-10-22 NOTE — Progress Notes (Signed)
 Electrophysiology Office Note:   Date:  10/22/2023  ID:  Manuel [name], DOB [birth year], MRN [id number]  Primary Cardiologist: None Electrophysiologist: Sherryl Manges, MD      History of Present Illness:   Manuel [name] is a 43 y.o. male with h/o testicular cancer requiring abdominal, genital, and chest irradiation, symptoms of POTs, and issues with BP seen today for post hospital follow up.    Seen in ED 2/7 with intermittent chest pain and extra/skipped beats on his phone. Denied URI symptoms or new medications; we had previously stopped his BB in setting of asthma. Vitals were normal, CXR was unremarkable, and CBC/Trop/BMP were WNL. Discharged for cardiology follow up.   Since discharge from hospital the patient reports doing about the same. He has continued to have consistent, persistent chest discomfort, localized to the left part of his chest ~4th rib. It is NOT tender to palpation and he denies any recent URIs. He has not be able to exercise or play tennis due to the symptoms that accompany, namely tingling and numbness in bilateral hands, as well as pain along the underside of both his arms and along his collar bones. .There is not an exertional component of the pain. He does have a decreased exercise tolerance since starting and subsequently stopping the propanolol. He had similar asthma-type reactions to metoprolol, atenolol, and bisoprolol in the past.   Review of systems complete and found to be negative unless listed in HPI.   EP Information / Studies Reviewed:    EKG is not ordered today. EKG from 10/10/2023 reviewed which showed NSR 79 bpm  without ST changes       Echo 09/2020 LVEF 65-70%, normal RV  Physical Exam:   VS:  BP 115/69 (BP Location: Left Arm, Patient Position: Sitting, Cuff Size: Normal)   Pulse 86   Ht 5\' 10"  (1.778 m)   Wt 146 lb (66.2 kg)   SpO2 97%   BMI 20.95 kg/m     Orthostatic VS for the past 72 hrs (Last 3 readings):  Patient Position  BP Location Cuff Size  10/22/23 1058 Sitting Left Arm Normal      Wt Readings from Last 3 Encounters:  10/22/23 146 lb (66.2 kg)  10/10/23 155 lb (70.3 kg)  06/26/23 152 lb 12.8 oz (69.3 kg)     GEN: No acute distress NECK: No JVD; No carotid bruits CARDIAC: Regular rate and rhythm, no murmurs, rubs, gallops RESPIRATORY:  Clear to auscultation without rales, wheezing or rhonchi  ABDOMEN: Soft, non-tender, non-distended EXTREMITIES:  No edema; No deformity   ASSESSMENT AND PLAN:    POTS  in past  Shower intolerance   Syncope/presyncope   Testicular cancer   Chest abdomen and perineal radiation   Fatigue   Labile blood pressure and raising the question of Baro reflex failure   Collagenous gastritis   Asthma induced by betablockers  24 hr urine WNL  With chest pain, will do Calcium scoring minimally invasive ischemic eval given the atypical nature of his symptoms, as well as a severe contrast allergy and intolerance of BB making a Coronary CT nearly impossible to tolerate for him.   Taking clonidine 0.1 mg as needed  Update Echo as well.   We also discussed the role of salt and water repletion, the importance of exercise, often needing to be started in the recumbent position, and the awareness of triggers and the role of ambient heat and dehydration. He will continue to use compression  garments, especially with exertion.   Follow up with EP APP in 6-8 weeks, sooner pending results of his testing, or with any additional MD rec pending discussion.   Signed, Graciella Freer, PA-C

## 2023-10-23 ENCOUNTER — Ambulatory Visit (HOSPITAL_COMMUNITY): Payer: BC Managed Care – PPO | Attending: Cardiology

## 2023-10-23 DIAGNOSIS — R0789 Other chest pain: Secondary | ICD-10-CM | POA: Insufficient documentation

## 2023-10-23 LAB — ECHOCARDIOGRAM COMPLETE
Area-P 1/2: 3.21 cm2
S' Lateral: 3 cm

## 2023-10-27 ENCOUNTER — Encounter: Payer: Self-pay | Admitting: Neurology

## 2023-10-28 ENCOUNTER — Ambulatory Visit: Payer: BC Managed Care – PPO | Admitting: Physician Assistant

## 2023-10-30 DIAGNOSIS — L820 Inflamed seborrheic keratosis: Secondary | ICD-10-CM | POA: Diagnosis not present

## 2023-10-30 NOTE — Telephone Encounter (Signed)
 They called the office to schedule the MRI and requested an open MRI.  Yetta Numbers: [id number] exp. 10/30/23-11/28/23 sent to [facility] [phone]

## 2023-10-31 ENCOUNTER — Ambulatory Visit (HOSPITAL_COMMUNITY)
Admission: RE | Admit: 2023-10-31 | Discharge: 2023-10-31 | Disposition: A | Discharge status: Home / Self Care | Payer: Self-pay | Source: Ambulatory Visit | Attending: Student | Admitting: Student

## 2023-10-31 ENCOUNTER — Encounter (HOSPITAL_COMMUNITY): Payer: Self-pay

## 2023-10-31 DIAGNOSIS — R0789 Other chest pain: Secondary | ICD-10-CM | POA: Insufficient documentation

## 2023-11-04 DIAGNOSIS — G43109 Migraine with aura, not intractable, without status migrainosus: Secondary | ICD-10-CM | POA: Diagnosis not present

## 2023-11-05 ENCOUNTER — Encounter (HOSPITAL_COMMUNITY): Payer: Self-pay

## 2023-11-05 ENCOUNTER — Emergency Department (HOSPITAL_COMMUNITY)
Admission: EM | Admit: 2023-11-05 | Discharge: 2023-11-05 | Disposition: A | Discharge status: Home / Self Care | Attending: Emergency Medicine | Admitting: Emergency Medicine

## 2023-11-05 DIAGNOSIS — R519 Headache, unspecified: Secondary | ICD-10-CM | POA: Diagnosis not present

## 2023-11-05 DIAGNOSIS — R202 Paresthesia of skin: Secondary | ICD-10-CM | POA: Diagnosis not present

## 2023-11-05 LAB — BASIC METABOLIC PANEL
Anion gap: 13 (ref 5–15)
BUN: 20 mg/dL (ref 6–20)
CO2: 23 mmol/L (ref 22–32)
Calcium: 9.7 mg/dL (ref 8.9–10.3)
Chloride: 102 mmol/L (ref 98–111)
Creatinine, Ser: 1.15 mg/dL (ref 0.61–1.24)
GFR, Estimated: >60 mL/min (ref >60)
Glucose, Bld: 86 mg/dL (ref 70–99)
Potassium: 3.7 mmol/L (ref 3.5–5.1)
Sodium: 138 mmol/L (ref 135–145)

## 2023-11-05 LAB — CBC WITH DIFFERENTIAL/PLATELET
Abs Immature Granulocytes: 0.01 10*3/uL (ref 0.00–0.07)
Basophils Absolute: 0.1 10*3/uL (ref 0.0–0.1)
Basophils Relative: 1 %
Eosinophils Absolute: 0.2 10*3/uL (ref 0.0–0.5)
Eosinophils Relative: 2 %
HCT: 43.7 % (ref 39.0–52.0)
Hemoglobin: 15.1 g/dL (ref 13.0–17.0)
Immature Granulocytes: 0 %
Lymphocytes Relative: 42 %
Lymphs Abs: 3.8 10*3/uL (ref 0.7–4.0)
MCH: 30 pg (ref 26.0–34.0)
MCHC: 34.6 g/dL (ref 30.0–36.0)
MCV: 86.7 fL (ref 80.0–100.0)
Monocytes Absolute: 0.5 10*3/uL (ref 0.1–1.0)
Monocytes Relative: 6 %
Neutro Abs: 4.4 10*3/uL (ref 1.7–7.7)
Neutrophils Relative %: 49 %
Platelets: 286 10*3/uL (ref 150–400)
RBC: 5.04 MIL/uL (ref 4.22–5.81)
RDW: 12 % (ref 11.5–15.5)
WBC: 8.9 10*3/uL (ref 4.0–10.5)
nRBC: 0 % (ref 0.0–0.2)

## 2023-11-05 LAB — MAGNESIUM: Magnesium: 2.3 mg/dL (ref 1.7–2.4)

## 2023-11-05 NOTE — ED Provider Triage Note (Signed)
 Emergency Medicine Provider Triage Evaluation Note  Manuel [name] , a 43 y.o. male  was evaluated in triage.  Pt complains of headache, cognitive dysfunction, contractures of bilateral hands, tingling of bilateral feet.  Symptoms have been present for a while now.  Has seen his neurologist for this.  He had an MRI done at [facility] yesterday.  Symptoms have just progressed.  Review of Systems  Positive: As above Negative: As above  Physical Exam  BP 122/75 (BP Location: Right Arm)   Pulse 78   Temp 98.1 F (36.7 C)   Resp 19   Ht 5\' 10"  (1.778 m)   Wt 66.2 kg   SpO2 100%   BMI 20.95 kg/m  Gen:   Awake, no distress   Resp:  Normal effort  MSK:   Moves extremities without difficulty  Other:    Medical Decision Making  Medically screening exam initiated at 5:50 PM.  Appropriate orders placed.  Manuel [name] was informed that the remainder of the evaluation will be completed by another provider, this initial triage assessment does not replace that evaluation, and the importance of remaining in the ED until their evaluation is complete.  MRI team working on requesting results from [place].  Patient will be next pack   Michelle Piper, [place] 11/05/23 1751

## 2023-11-05 NOTE — Discharge Instructions (Signed)
 Please read and follow all provided instructions.  Your diagnoses today include:  1. Acute nonintractable headache, unspecified headache type   2. Paresthesia     Tests performed today include: Complete blood cell count: Was normal Basic metabolic panel: Was normal including magnesium Vital signs. See below for your results today.   Medications:  None  Take any prescribed medications only as directed.  Additional information:  Follow any educational materials contained in this packet.  You are having a headache. No specific cause was found today for your headache. It may have been a migraine or other cause of headache. Stress, anxiety, fatigue, and depression are common triggers for headaches.   Your headache today does not appear to be life-threatening or require hospitalization, but often the exact cause of headaches is not determined in the emergency department. Therefore, follow-up with your doctor is very important to find out what may have caused your headache and whether or not you need any further diagnostic testing or treatment.   Sometimes headaches can appear benign (not harmful), but then more serious symptoms can develop which should prompt an immediate re-evaluation by your doctor or the emergency department.  BE VERY CAREFUL not to take multiple medicines containing Tylenol (also called acetaminophen). Doing so can lead to an overdose which can damage your liver and cause liver failure and possibly death.   Follow-up instructions: Please follow-up with your primary care provider/neurologist in the next 3 days for further evaluation of your symptoms.   Return instructions:  Please return to the Emergency Department if you experience worsening symptoms. Return if the medications do not resolve your headache, if it recurs, or if you have multiple episodes of vomiting or cannot keep down fluids. Return if you have a change from the usual headache. RETURN IMMEDIATELY IF  you: Develop a sudden, severe headache Develop confusion or become poorly responsive or faint Develop a fever above 100.36F or problem breathing Have a change in speech, vision, swallowing, or understanding Develop new weakness, numbness, tingling, incoordination in your arms or legs Have a seizure Please return if you have any other emergent concerns.  Additional Information:  Your vital signs today were: BP 100/70 (BP Location: Right Arm)   Pulse 75   Temp 99.2 F (37.3 C) (Oral)   Resp 18   Ht 5\' 10"  (1.778 m)   Wt 66.2 kg   SpO2 100%   BMI 20.95 kg/m  If your blood pressure (BP) was elevated above 135/85 this visit, please have this repeated by your doctor within one month. --------------

## 2023-11-05 NOTE — ED Provider Notes (Signed)
 [place] EMERGENCY DEPARTMENT AT [facility] Provider Note   CSN: [id number] Arrival date & time: 11/05/23  1728     History  Chief Complaint  Patient presents with   Migraine   Tingling    Manuel [name] is a 43 y.o. male.  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  Patient reports approximately 5 days of symptoms.  He has had wavy lines in his vision, tingling in his upper extremities from his fingers to his shoulders bilaterally, tingling in lower extremities from his toes to shins bilaterally.  No focal weakness.  At times he will have "contractions" of his upper extremities.  Headache has really been waxing and waning.  Typical headaches consist of aura similar to above symptoms, with subsequent headache that usually resolves with Tylenol and ice packs after couple of hours.  He states that the contracting symptoms are different than typical however and it is very unusual for his symptoms to last this long.  Patient had an MRI performed yesterday, the results of which are currently unavailable.  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       Home Medications Prior to Admission medications   Medication Sig Start Date End Date Taking? Authorizing Provider  acebutolol (SECTRAL) 200 MG capsule Take 1 capsule (200 mg total) by mouth 2 (two) times daily. Patient not taking: Reported on 10/22/2023 10/20/23   Duke Salvia, MD  cloNIDine (CATAPRES) 0.1 MG tablet TAKE 1 TABLET BY MOUTH DAILY AS NEEDED 10/06/23   Duke Salvia, MD  EPINEPHrine 0.3 mg/0.3 mL IJ SOAJ injection Inject 0.3 mg into the muscle as  needed for anaphylaxis. 09/16/23   [provider]  fludrocortisone (FLORINEF) 0.1 MG tablet Take 1 tablet (0.1 mg total) by mouth 2 (two) times daily. Patient not taking: Reported on 10/22/2023 05/07/23   Duke Salvia, MD  ondansetron (ZOFRAN) 8 MG tablet Take 1 tablet (8 mg total) by mouth every 8 (eight) hours as needed for nausea. Patient not taking: Reported on 10/22/2023 10/08/22   Artis Delay, MD  prochlorperazine (COMPAZINE) 10 MG tablet Take 1 tablet (10 mg total) by mouth every 8 (eight) hours as needed for nausea or vomiting. 08/20/23   Fayrene Helper, PA-C  sodium chloride (OCEAN) 0.65 % SOLN nasal spray Place 1 spray into both nostrils as needed for congestion.    [provider]  Ubrogepant (UBRELVY) 50 MG TABS Take 1 tab at onset of migraine.  May repeat in 2 hrs, if needed.  Max dose: 2 tabs/day. This is a 30 day prescription. Patient not taking: Reported on 10/22/2023 06/26/23   Levert Feinstein, MD      Allergies    Codeine, Contrast media [iodinated contrast media], Erythromycin, Iodine, Shellfish allergy, Doxycycline, Azithromycin, Corn-containing products, Dilaudid [hydromorphone hcl], Dilaudid [hydromorphone], Etodolac, Ioversol, Nsaids, Tetracyclines & related, and Prednisone    Review of Systems   Review of Systems  Physical Exam Updated Vital Signs BP 122/75 (BP Location: Right Arm)   Pulse 78   Temp 98.1 F (36.7 C)   Resp 19   Ht 5\' 10"  (1.778 m)   Wt 66.2 kg   SpO2 100%   BMI  20.95 kg/m   Physical Exam Vitals and nursing note reviewed.  Constitutional:      Appearance: He is well-developed.  HENT:     Head: Normocephalic and atraumatic.     Right Ear: Tympanic membrane, ear canal and external ear normal.     Left Ear: Tympanic membrane, ear canal and external ear normal.     Nose: Nose normal.     Mouth/Throat:     Pharynx: Uvula midline.  Eyes:     General: Lids are normal.     Conjunctiva/sclera: Conjunctivae normal.     Pupils: Pupils are  equal, round, and reactive to light.  Cardiovascular:     Rate and Rhythm: Normal rate and regular rhythm.  Pulmonary:     Effort: Pulmonary effort is normal.     Breath sounds: Normal breath sounds.  Abdominal:     Palpations: Abdomen is soft.     Tenderness: There is no abdominal tenderness.  Musculoskeletal:        General: Normal range of motion.     Cervical back: Normal range of motion and neck supple. No tenderness or bony tenderness.  Skin:    General: Skin is warm and dry.  Neurological:     Mental Status: He is alert and oriented to person, place, and time.     GCS: GCS eye subscore is 4. GCS verbal subscore is 5. GCS motor subscore is 6.     Cranial Nerves: Cranial nerves 2-12 are intact. No cranial nerve deficit.     Sensory: Sensory deficit (Reports upper and lower extremity paresthesias, not true numbness but described as a decree sensation.) present.     Motor: No abnormal muscle tone.     Coordination: Coordination normal.     Gait: Gait normal.     ED Results / Procedures / Treatments   Labs (all labs ordered are listed, but only abnormal results are displayed) Labs Reviewed  BASIC METABOLIC PANEL  CBC WITH DIFFERENTIAL/PLATELET  MAGNESIUM    EKG None  Radiology No results found.  Procedures Procedures    Medications Ordered in ED Medications - No data to display  ED Course/ Medical Decision Making/ A&P    Patient seen and examined. History obtained directly from patient.  Reviewed previous ED notes, previous outpatient neurology notes, previous neurology consult note.  Labs/EKG: Ordered CBC, CMP, magnesium.  Imaging: None ordered  Medications/Fluids: Offered migraine cocktail, Tylenol.  Patient has multiple allergies.  He states that he would prefer not to have any medications today and is more interested in diagnostic testing.  Most recent vital signs reviewed and are as follows: BP 100/70 (BP Location: Right Arm)   Pulse 75   Temp 99.2  F (37.3 C) (Oral)   Resp 18   Ht 5\' 10"  (1.778 m)   Wt 66.2 kg   SpO2 100%   BMI 20.95 kg/m   Initial impression: atypical HA and symptoms in patient with h/o migraines and complex migraine.   Patient discussed with Dr. Suezanne Jacquet who has seen. Confirms patient not interested in symptomatic treatment at this time.  Reassuring neuroexam here.  No indication for emergent MRI.  After labs, plan discharge with close outpatient follow-up.  9:27 PM Reassessment performed. Patient appears stable.  He states that he is thinking more clearly now.  Labs personally reviewed and interpreted including: CBC, BMP and magnesium were all unremarkable.  Reviewed pertinent lab work and imaging with patient at bedside. Questions answered.   Most current vital  signs reviewed and are as follows: BP 100/70 (BP Location: Right Arm)   Pulse 75   Temp 99.2 F (37.3 C) (Oral)   Resp 18   Ht 5\' 10"  (1.778 m)   Wt 66.2 kg   SpO2 100%   BMI 20.95 kg/m   Plan: Discharge to home.  Patient and wife comfortable with discharge at this time.  Prescriptions written for: None  Other home care instructions discussed: Monitoring of symptoms  ED return instructions discussed: Patient counseled to return if they have weakness in their arms or legs, slurred speech, trouble walking or talking, confusion, trouble with their balance, or if they have any other concerns. Patient verbalizes understanding and agrees with plan.   Follow-up instructions discussed: Patient encouraged to follow-up with their PCP/neuro in 3 days.                                 Medical Decision Making  In regards to the patient's headache, critical differentials were considered including subarachnoid hemorrhage, intracerebral hemorrhage, epidural/subdural hematoma, pituitary apoplexy, vertebral/carotid artery dissection, giant cell arteritis, central venous thrombosis, reversible cerebral vasoconstriction, acute angle closure glaucoma,  idiopathic intracranial hypertension, bacterial meningitis, viral encephalitis, carbon monoxide poisoning, posterior reversible encephalopathy syndrome, pre-eclampsia.   Reg flag symptoms related to these causes were considered including systemic symptoms (fever, weight loss), neurologic symptoms (confusion, mental status change, vision change, associated seizure), acute or sudden "thunderclap" onset, patient age 12 or older with new or progressive headache, patient of any age with first headache or change in headache pattern, pregnant or postpartum status, history of HIV or other immunocompromise, history of cancer, headache occurring with exertion, associated neck or shoulder pain, associated traumatic injury, concurrent use of anticoagulation, family history of spontaneous SAH, and concurrent drug use.    Other benign, more common causes of headache were considered including migraine, tension-type headache, cluster headache, referred pain from other cause such as sinus infection, dental pain, trigeminal neuralgia.   On exam, patient has a reassuring neuro exam including baseline mental status, no significant neck pain or meningeal signs, no signs of severe infection or fever.   The patient's vital signs, pertinent lab work and imaging were reviewed and interpreted as discussed in the ED course. Hospitalization was considered for further testing, treatments, or serial exams/observation. However as patient is well-appearing, has a stable exam over the course of their evaluation, and reassuring studies today, I do not feel that they warrant admission at this time. This plan was discussed with the patient who verbalizes agreement and comfort with this plan and seems reliable and able to return to the Emergency Department with worsening or changing symptoms.          Final Clinical Impression(s) / ED Diagnoses Final diagnoses:  Acute nonintractable headache, unspecified headache type  Paresthesia     Rx / DC Orders ED Discharge Orders     None         Renne Crigler, Cordelia Poche 11/05/23 2128    Lonell Grandchild, MD 11/05/23 2350

## 2023-11-05 NOTE — ED Triage Notes (Addendum)
 Pt c/o migraine x5 days and worsening tingling/pins and needles in bilateral arms/hands, bilateral feet, and L side face.  Pain score 6/10.  Pt reports vision in R eye is "cutting in and out."  Sts typical vision issues are "like lines."      Pt has had numerous visits with neurology and cardiology.  Pt had a MRI brain yesterday at [facility].

## 2023-11-07 ENCOUNTER — Other Ambulatory Visit (HOSPITAL_BASED_OUTPATIENT_CLINIC_OR_DEPARTMENT_OTHER): Payer: BC Managed Care – PPO

## 2023-11-10 ENCOUNTER — Encounter: Payer: Self-pay | Admitting: Neurology

## 2023-11-11 DIAGNOSIS — G90A Postural orthostatic tachycardia syndrome (POTS): Secondary | ICD-10-CM | POA: Diagnosis not present

## 2023-11-11 DIAGNOSIS — K2960 Other gastritis without bleeding: Secondary | ICD-10-CM | POA: Diagnosis not present

## 2023-11-11 DIAGNOSIS — G43809 Other migraine, not intractable, without status migrainosus: Secondary | ICD-10-CM | POA: Diagnosis not present

## 2023-11-11 DIAGNOSIS — Z8547 Personal history of malignant neoplasm of testis: Secondary | ICD-10-CM | POA: Diagnosis not present

## 2023-11-11 NOTE — Telephone Encounter (Signed)
 Please Advise

## 2023-11-12 DIAGNOSIS — M791 Myalgia, unspecified site: Secondary | ICD-10-CM | POA: Diagnosis not present

## 2023-11-12 DIAGNOSIS — Z13220 Encounter for screening for lipoid disorders: Secondary | ICD-10-CM | POA: Diagnosis not present

## 2023-11-12 DIAGNOSIS — G629 Polyneuropathy, unspecified: Secondary | ICD-10-CM | POA: Diagnosis not present

## 2023-11-12 DIAGNOSIS — Z0000 Encounter for general adult medical examination without abnormal findings: Secondary | ICD-10-CM | POA: Diagnosis not present

## 2023-11-12 DIAGNOSIS — G90A Postural orthostatic tachycardia syndrome (POTS): Secondary | ICD-10-CM | POA: Diagnosis not present

## 2023-11-18 ENCOUNTER — Telehealth: Payer: Self-pay

## 2023-11-18 DIAGNOSIS — R202 Paresthesia of skin: Secondary | ICD-10-CM

## 2023-11-18 NOTE — Addendum Note (Signed)
 Addended by: Levert Feinstein on: 11/18/2023 06:05 PM   Modules accepted: Orders

## 2023-11-18 NOTE — Telephone Encounter (Addendum)
 I called patient, his migraine last for one week, has improved, he now concern of progressive bilateral hands and toes paresthesia, it is all the time,   Feel worst at night, last night could not feel his hands, pins and needles in his arms and feet, sometimes even to his teeth, but not involve his torso.  But denies significant gait abnormality, heels numb today, mild weird gait today.  He could not play guitar and tennis.   Ordered EMG/NCS.  Put him on my schedule for March 26th at 2:20pm, let him know, he might have to wait

## 2023-11-18 NOTE — Telephone Encounter (Signed)
 Call to patient after receiving mychart message, he reports numbness in both hands and feet that progress upward/inward to trunk of body that turns into more pain versus numbness. He has not had a migraine in over 1 week. He does not think it is associated with migraines and is concerned. He does not take any medications for this. Was recently seen in ED March 5 for migraine and parathesia. Reports during migraines, he will have slurred speech but returns when headache is gone.  also following up with Gi for 25 lb weight loss over 6 weeks. Advised I would send to Dr. Terrace Arabia for review. Patient appreciative of call

## 2023-11-19 NOTE — Telephone Encounter (Signed)
 Discussed wit Lawanna Kobus, She will call patient to schedule

## 2023-11-19 NOTE — Telephone Encounter (Signed)
 Patient is scheduled for NCV/EMG for 11/26/23 at 2:20pm and is aware that he may have to wait due to being worked in. He asked if this would be checking his vagus nerve. Could you call him back and let him know? Thank you!

## 2023-11-20 ENCOUNTER — Encounter: Payer: Self-pay | Admitting: Neurology

## 2023-11-20 DIAGNOSIS — R634 Abnormal weight loss: Secondary | ICD-10-CM | POA: Diagnosis not present

## 2023-11-20 DIAGNOSIS — D509 Iron deficiency anemia, unspecified: Secondary | ICD-10-CM | POA: Diagnosis not present

## 2023-11-20 DIAGNOSIS — K9089 Other intestinal malabsorption: Secondary | ICD-10-CM | POA: Diagnosis not present

## 2023-11-20 DIAGNOSIS — K449 Diaphragmatic hernia without obstruction or gangrene: Secondary | ICD-10-CM | POA: Diagnosis not present

## 2023-11-20 DIAGNOSIS — K2960 Other gastritis without bleeding: Secondary | ICD-10-CM | POA: Diagnosis not present

## 2023-11-20 DIAGNOSIS — G901 Familial dysautonomia [Riley-Day]: Secondary | ICD-10-CM | POA: Diagnosis not present

## 2023-11-20 NOTE — Telephone Encounter (Signed)
 noted

## 2023-11-20 NOTE — Telephone Encounter (Signed)
 NCS will not address his vagus nerve, further discussion during office visit.

## 2023-11-24 ENCOUNTER — Encounter: Payer: Self-pay | Admitting: Internal Medicine

## 2023-11-24 ENCOUNTER — Ambulatory Visit: Payer: BC Managed Care – PPO | Attending: Internal Medicine | Admitting: Internal Medicine

## 2023-11-24 ENCOUNTER — Telehealth: Payer: Self-pay | Admitting: Neurology

## 2023-11-24 ENCOUNTER — Telehealth: Payer: Self-pay

## 2023-11-24 DIAGNOSIS — R079 Chest pain, unspecified: Secondary | ICD-10-CM

## 2023-11-24 DIAGNOSIS — G90A Postural orthostatic tachycardia syndrome (POTS): Secondary | ICD-10-CM

## 2023-11-24 NOTE — Telephone Encounter (Signed)
 Left the patient a message that I was calling to get him ready for his phone visit and to call back at [phone].

## 2023-11-24 NOTE — Telephone Encounter (Signed)
 I called to confirm pt appt for 11/26/23 for Nerve cond. Study. Wanted to know if testing can still be done if his symptoms are no longer present like they were before.

## 2023-11-24 NOTE — Patient Instructions (Signed)
 Medication Instructions:  Your physician recommends that you continue on your current medications as directed. Please refer to the Current Medication list given to you today.  *If you need a refill on your cardiac medications before your next appointment, please call your pharmacy*   Lab Work: None ordered.  If you have labs (blood work) drawn today and your tests are completely normal, you will receive your results only by: MyChart Message (if you have MyChart) OR A paper copy in the mail If you have any lab test that is abnormal or we need to change your treatment, we will call you to review the results.   Testing/Procedures: None ordered.    Follow-Up: At [facility], you and your health needs are our priority.  As part of our continuing mission to provide you with exceptional heart care, we have created designated Provider Care Teams.  These Care Teams include your primary Cardiologist (physician) and Advanced Practice Providers (APPs -  Physician Assistants and Nurse Practitioners) who all work together to provide you with the care you need, when you need it.  We recommend signing up for the patient portal called "MyChart".  Sign up information is provided on this After Visit Summary.  MyChart is used to connect with patients for Virtual Visits (Telemedicine).  Patients are able to view lab/test results, encounter notes, upcoming appointments, etc.  Non-urgent messages can be sent to your provider as well.   To learn more about what you can do with MyChart, go to [email or web address].    Your next appointment:   As discussed by Dr Graciela Husbands

## 2023-11-24 NOTE — Telephone Encounter (Signed)
 Called and spoke to pt and stated that yes ncs emg is needed

## 2023-11-24 NOTE — Progress Notes (Signed)
 Electrophysiology TeleHealth Note      Date:  11/24/2023   ID:  Manuel [name], DOB [birth year], MRN [id number]  Location: patient's home  Provider location: [address] [place]  Evaluation Performed: Follow-up visit  PCP:  Beam, Chales Salmon, MD  Cardiologist:    Electrophysiologist:  SK   Chief Complaint:  lightheadedness  History of Present Illness:   My location is [facility]. The Patients location is their home. Manuel [name] is a 43 y.o. male who presents via/video conferencing for a telehealth visit today.  Since last being seen in our clinic for postural and exercise intolerance and symptomatic tachycardia without objective evidence of orthostatic hypotension or tachycardia in the context of prior chest radiation for testicular cancer initially quite responsive to salt water and compression and then with clinical recurrences associate with syncope and presyncope and significant fatigue and currently managed with a combination of fludrocortisone and acebutolol and as needed clonidine, collagenous colitis the patient reports things being much worse  Resolving symptoms coming off the betablocker stopped  2/2 worsening breathing >> hyperanxiety x 30-60 days  Nerve and tinging in extremities; numbness in hands feets and arms--   migraines, poor sleeping No exercising since NOV  Chest pain in ER  CaScore 0; palpitations with ALIVECor>> PVCs  Worsening orthostatic symptoms, now with tachy palpitations  Post prandial hypotension  Vit C and Folate levels exceedingly low -  felt better with blood draw and using the vagal stimulator    Weight loss 27 lbs>>following recovery  of weight.    Syncope     He raises the question of Ro[place] syndrome that is interaction between the GI track and the heart by way of autonomic's.  He has postprandial hypotension as well as postprandial palpitations.  He also raised the question about  something called gamma core Safire vagal stimulator a percutaneous device used post stroke but apparently in some people stands use for dysautonomia. 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   Viral meningitis 04/08/2014   Vision changes 10/05/2015    Past Surgical History:  Procedure Laterality Date   BIOPSY  02/11/2023   Procedure: BIOPSY;  Surgeon: Vida Rigger, MD;  Location: [facility];  Service: Gastroenterology;;   ESOPHAGOGASTRODUODENOSCOPY N/A 02/11/2023   Procedure: ESOPHAGOGASTRODUODENOSCOPY (EGD);  Surgeon: Vida Rigger, MD;  Location: [facility];  Service: Gastroenterology;  Laterality: N/A;   ORCHIECTOMY     ROTATOR CUFF REPAIR     SURGERY SCROTAL / TESTICULAR  2006    Current Outpatient Medications  Medication Sig Dispense Refill   acebutolol (SECTRAL) 200 MG capsule Take 1 capsule (200 mg total) by mouth 2 (two) times daily. 60 capsule 0   acetaminophen (TYLENOL) 500 MG tablet Take 500 mg by mouth every 6 (six) hours as needed for mild pain (pain score 1-3).     cloNIDine (CATAPRES) 0.1 MG tablet TAKE 1 TABLET BY MOUTH DAILY AS NEEDED 30 tablet  11   EPINEPHrine 0.3 mg/0.3 mL IJ SOAJ injection Inject 0.3 mg into the muscle as needed for anaphylaxis.     fludrocortisone (FLORINEF) 0.1 MG tablet Take 1 tablet (0.1 mg total) by mouth 2 (two) times daily. 60 tablet 11   ondansetron (ZOFRAN) 8 MG tablet Take 1 tablet (8 mg total) by mouth every 8 (eight) hours as needed for nausea. 30 tablet 3   prochlorperazine  (COMPAZINE) 10 MG tablet Take 1 tablet (10 mg total) by mouth every 8 (eight) hours as needed for nausea or vomiting. 10 tablet 0   Ubrogepant (UBRELVY) 50 MG TABS Take 1 tab at onset of migraine.  May repeat in 2 hrs, if needed.  Max dose: 2 tabs/day. This is a 30 day prescription. 12 tablet 6   No current facility-administered medications for this visit.    Allergies:   Codeine, Contrast media [iodinated contrast media], Erythromycin, Iodine, Shellfish allergy, Doxycycline, Azithromycin, Corn-containing products, Dilaudid [hydromorphone hcl], Dilaudid [hydromorphone], Etodolac, Ioversol, Nsaids, Tetracyclines & related, and Prednisone   ROS:  Please see the history of present illness.   All other systems are personally reviewed and negative.    Exam:    Vital Signs:  BP 113/68   Pulse 67   Ht 5\' 10"  (1.778 m)   BMI 20.95 kg/m       Labs/Other Tests and Data Reviewed:    Recent Labs: 06/23/2023: ALT 22 10/10/2023: TSH 1.792 11/05/2023: BUN 20; Creatinine, Ser 1.15; Hemoglobin 15.1; Magnesium 2.3; Platelets 286; Potassium 3.7; Sodium 138   Wt Readings from Last 3 Encounters:  11/05/23 146 lb (66.2 kg)  10/22/23 146 lb (66.2 kg)  10/10/23 155 lb (70.3 kg)     Other studies personally reviewed: Additional studies/ records that were reviewed today include:      ASSESSMENT & PLAN:    POTS in the past   Shower intolerance   Syncope/presyncope   Testicular cancer   Chest abdomen and perineal radiation   Fatigue   Labile blood pressure and raising the question of Baro reflex failure   Collagenous gastritis    Worsening symptoms for unclear trigger. It seems a lot of it is triggered by his GI track and he has this diagnosis collagenous gastritis, perhaps GI input going to be primary here trying to help settle this down.  We talked about the low use of clonidine as a central sympathomimetic worry a little bit about that given his low blood pressure  He says that his  heart rates are going up with standing, we have never demonstrated orthostatic tachycardia in the past.  Will get these for [place] and help [place] with this.  As a relates to the gamma core Safire stimulator, he needs a prescription for this, and a brief look I could not really find much harm in this I will try do a little bit more work on this.    Follow-up:  54m    Current medicines are reviewed at length with the patient today.   The patient  concerns regarding his medicines.  The following changes were made today:    Labs/ tests ordered today include:  No orders of the defined types were placed in this encounter.     Today, I have spent 43 minutes with the patient with telehealth technology discussing the above.  Signed, Sherryl Manges, MD  11/24/2023 1:33 PM     [facility] [address] [place] [address] [phone] (office) [phone] (fax)

## 2023-11-24 NOTE — Telephone Encounter (Signed)
 ..  Pt understands that although there may be some limitations with this type of visit, we will take all precautions to reduce any security or privacy concerns.  Pt understands that this will be treated like an in office visit and we will file with pt's insurance, and there may be a patient responsible charge related to this service. ? ?

## 2023-11-25 NOTE — Telephone Encounter (Signed)
 Pt reports that he just received results from a lot of lab work his pcp ordered, a lot of the results were low and he is questioning if these results could be cause of his issues. Pt is asking for a call from RN to discuss if he should hold off on his NCV/EMG please call pt.

## 2023-11-25 NOTE — Telephone Encounter (Signed)
 Called and spoke to pt and stated that yes he needed to do nerve conduction. He had labs from pcp stating that vitamin c, d , and folate low. And wanted to make sure that the appointment was needed for ncs emg. I advised him that the ncs emg will be Manuel [name]. I offered a video visit and postpone emg but once he found out Dr. Terrace Arabia was doing it he felt at ease.

## 2023-11-26 ENCOUNTER — Encounter: Payer: Self-pay | Admitting: Neurology

## 2023-11-26 ENCOUNTER — Ambulatory Visit (INDEPENDENT_AMBULATORY_CARE_PROVIDER_SITE_OTHER): Admitting: Neurology

## 2023-11-26 VITALS — BP 120/73 | Resp 16 | Wt 146.0 lb

## 2023-11-26 DIAGNOSIS — R202 Paresthesia of skin: Secondary | ICD-10-CM | POA: Diagnosis not present

## 2023-11-26 DIAGNOSIS — G43709 Chronic migraine without aura, not intractable, without status migrainosus: Secondary | ICD-10-CM | POA: Insufficient documentation

## 2023-11-26 NOTE — Progress Notes (Signed)
 Chief Complaint  Patient presents with   NERVE CONDUCTION STUDY    Emgrm4 wife present, Pt is well and ready for nerve conduction study      ASSESSMENT AND PLAN  Manuel [name] is a 43 y.o. male   Chronic migraine  Normal MRI of the brain without contrast from [facility] November 04, 2023,  Intermittent upper and lower extremity paresthesia  Essentially normal neurological examinations  Normal electrodiagnostic study  Laboratory evaluation showed mild decreased vitamin levels, I am not sure it contributed to his current complaints,  His current symptoms has caused a lot of concern, offered treatment such as Cymbalta, neuropathic pain medication  He would prefer to observe his symptoms  DIAGNOSTIC DATA (LABS, IMAGING, TESTING) - I reviewed patient records, labs, notes, testing and imaging myself where available.   MEDICAL HISTORY:  Manuel [name], is a 43 year old male seen in request by emergency for evaluation of visual disturbance, headache, initial evaluation was on June 26, 2023  History is obtained from the patient and review of electronic medical records. I personally reviewed pertinent available imaging films in PACS.   PMHx of  Testicular cancer in 2006, s/p orchiectomy radiation therapy. Collagenous gastritis  Baroreceptor failure  He presented to the emergency room June 23, 2023, symptoms started around 1145 during his workout, he felt dizziness, light sensitivity, low energy, he went to his car drink some water, was able to finish workout, eat lunch, 1 hour later, he began to noticed lightheadedness, dizziness, decreased bilateral peripheral vision, bright spot in his right visual field, blocking his vision, he also complains of right hand numbness, clumsiness, this is different from his previous migraine  He did develop severe headaches, with light noise sensitivity, he went to sleep woke up next morning, headache is better, but still have some  visual concerns, presented to the emergency room CT head without contrast was normal  He had 1 similar episode with prolonged visual disturbance, but not as severe  CT abdomen pelvic in September 2024 without contrast for evaluation of weight loss, history of testicular cancer showed no significant abnormality  He had a history of testicular cancer in 2006, status post orchiectomy, followed by radiation therapy from neck to pelvic region,  He has migraine headaches every couple months, typical migraine are retro-orbital area headache with light noise sensitivity, usually relieved by ice pack, hydration, sleep, she has not taking any prescription medicine for that  He has a history of migraines since teens, remembering middle school go to school nurse because seeing black-white checkbox in his visual field, nauseous, urged to use bathroom, variable degree of headache  Trigger for his migraine smell, strong perfume, light, alcohol, corn,  UPDATE March 26th 2025: He is accompanied by his wife at today's visit, multiple MyChart message since last visit complains of intermittent upper and lower extremity paresthesia, electrodiagnostic study today is essentially normal, there was no evidence of large fiber peripheral neuropathy right cervical or lumbosacral radiculopathy Symptoms present most of the time, he has to take Tylenol as needed, used ice pack frequently  Laboratory evaluation in February 2025 showed mildly decreased vitamin D 22, normal B12 396, TSH, ferritin, iron level, BMP, CBC, hemoglobin 15.1,  From [facility] November 20, 2023: Folic acid was mildly decreased 2.4, vitamin C was decreased 0.2, normal vitamin A, vitamin D level was also decreased 0.3, normal zinc, lactate dehydrogenase, negative Lyme titer, normal vitamin K, cortisol level, negative anti-neurotrophic cytoplasmic antibody, Lyme titer, normal  C-reactive protein, CPK,  PHYSICAL EXAM:   Vitals:   11/26/23 1409  BP:  120/73  Resp: 16  SpO2: 95%  Weight: 146 lb (66.2 kg)   Body mass index is 20.95 kg/m.  PHYSICAL EXAMNIATION:  Gen: NAD, conversant, well nourised, well groomed                     Cardiovascular: Regular rate rhythm, no peripheral edema, warm, nontender. Eyes: Conjunctivae clear without exudates or hemorrhage Neck: Supple, no carotid bruits. Pulmonary: Clear to auscultation bilaterally   NEUROLOGICAL EXAM:  MENTAL STATUS: Speech/cognition: Awake, alert, oriented to history taking and casual conversation CRANIAL NERVES: CN II: Visual fields are full to confrontation. Pupils are round equal and briskly reactive to light. CN [name], IV, VI: extraocular movement are normal. No ptosis. CN V: Facial sensation is intact to light touch CN VII: Face is symmetric with normal eye closure  CN VIII: Hearing is normal to causal conversation. CN IX, X: Phonation is normal. CN XI: Head turning and shoulder shrug are intact  MOTOR: There is no pronator drift of out-stretched arms. Muscle bulk and tone are normal. Muscle strength is normal.  REFLEXES: Reflexes are 2+ and symmetric at the biceps, triceps, knees, and ankles. Plantar responses are flexor.  SENSORY: Intact to light touch, pinprick and vibratory sensation are intact in fingers and toes.  COORDINATION: There is no trunk or limb dysmetria noted.  GAIT/STANCE: Posture is normal. Gait is steady with normal steps, base, arm swing, and turning. Heel and toe walking are normal. Tandem gait is normal.  Romberg is absent.  REVIEW OF SYSTEMS:  Full 14 system review of systems performed and notable only for as above All other review of systems were negative.   ALLERGIES: Allergies  Allergen Reactions   Codeine Nausea Only and Anaphylaxis    GI upset   Contrast Media [Iodinated Contrast Media] Anaphylaxis   Erythromycin Hives, Itching and Anaphylaxis   Iodine Anaphylaxis    Other reaction(s): anaphylaxis   Shellfish Allergy  Anaphylaxis   Doxycycline Nausea And Vomiting   Azithromycin    Corn-Containing Products Nausea And Vomiting   Dilaudid [Hydromorphone Hcl] Other (See Comments)    Nausea   Dilaudid [Hydromorphone]     Other reaction(s): urinary retention   Etodolac Other (See Comments)   Ioversol    Nsaids Other (See Comments)    Other reaction(s): Palpitations (01/2015)   Tetracyclines & Related    Prednisone Anxiety    Stomach upset    HOME MEDICATIONS: No current outpatient medications on file.   No current facility-administered medications for this visit.    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   Viral meningitis 04/08/2014   Vision changes 10/05/2015    PAST SURGICAL HISTORY: Past Surgical History:  Procedure Laterality Date   BIOPSY  02/11/2023   Procedure: BIOPSY;  Surgeon: Vida Rigger, MD;  Location: [facility];  Service: Gastroenterology;;   ESOPHAGOGASTRODUODENOSCOPY N/A  02/11/2023   Procedure: ESOPHAGOGASTRODUODENOSCOPY (EGD);  Surgeon: Vida Rigger, MD;  Location: [facility];  Service: Gastroenterology;  Laterality: N/A;   ORCHIECTOMY     ROTATOR CUFF REPAIR     SURGERY SCROTAL / TESTICULAR  2006    FAMILY HISTORY: Family History  Problem Relation Age of Onset   Migraines Mother    Diabetes type II Father     SOCIAL HISTORY: Social History   Socioeconomic History   Marital status: Married    Spouse name: Marchelle Folks   Number of children: 2   Years of education: 16   Highest education  level: Not on file  Occupational History   Occupation: Airline pilot  Tobacco Use   Smoking status: Never   Smokeless tobacco: Never  Substance and Sexual Activity   Alcohol use: Not Currently   Drug use: No   Sexual activity: Not on file  Other Topics Concern   Not on file  Social History Narrative   Lives with wife and kids   Caffeine use: none   Social Drivers of Health   Financial Resource Strain: Patient Declined (09/16/2023)   Received from [facility]   Overall Financial Resource Strain (CARDIA)    Difficulty of Paying Living Expenses: Patient declined  Food Insecurity: No Food Insecurity (09/16/2023)   Received from [facility]   Hunger Vital Sign    Worried About Running Out of Food in the Last Year: Never true    Ran Out of Food in the Last Year: Never true  Transportation Needs: No Transportation Needs (09/16/2023)   Received from [facility] - Transportation    Lack of Transportation (Medical): No    Lack of Transportation (Non-Medical): No  Physical Activity: Sufficiently Active (09/16/2023)   Received from [facility]   Exercise Vital Sign    Days of Exercise per Week: 3 days    Minutes of Exercise per Session: 60 min  Stress: Stress Concern Present (09/16/2023)   Received from [facility] - Occupational Stress Questionnaire    Feeling of Stress : To some extent  Social Connections: Moderately Integrated (09/16/2023)   Received from [facility]   Social Network    How would you rate your social network (family, work, friends)?: Adequate participation with social networks  Intimate Partner Violence: Not At Risk (09/16/2023)   Received from [facility]   HITS    Over the last 12 months how often did your partner physically hurt you?: Never    Over the last 12 months how often did your partner insult you or talk down to you?: Never    Over the last 12 months how often did your partner threaten you with  physical harm?: Never    Over the last 12 months how often did your partner scream or curse at you?: Never      Levert Feinstein, M.D. Ph.D.  [facility] [address] [place], [place] [address] Ph: [phone] Fax: [phone]  CC:  Beam, Chales Salmon, MD [address] [place],  [place] [address]  Beam, Chales Salmon, MD

## 2023-11-26 NOTE — Procedures (Signed)
 Full Name: Manuel [name] Gender: Male MRN #: [id number] Date of Birth: 06/30/1981    Visit Date: 11/26/2023 14:58 Age: 43 Years Examining Physician: Levert Feinstein Referring Physician: Levert Feinstein  Height: 5 feet 10 inch  History: 43 year old male complains of intermittent upper and lower extremity paresthesia  Summary of the test: Nerve conduction study: Right sural, superficial peroneal sensory responses were normal.  Right peroneal to EDB, tibial motor responses were normal. Right median ulnar sensory and motor responses were normal.  Electromyography: Selected needle examinations of right upper and lower extremity muscles were normal.  There was no spontaneous activity at right cervical and lumbar paraspinal muscles  Conclusion: This is a normal study.  There is no electrodiagnostic evidence of focal neuropathy, right cervical or lumbar radiculopathy.    Levert Feinstein. M.D. Ph.D.  [facility] [address] [place], [place] [address] Tel: [phone] Fax: [phone]  Verbal informed consent was obtained from the patient, patient was informed of potential risk of procedure, including bruising, bleeding, hematoma formation, infection, muscle weakness, muscle pain, numbness, among others.        MNC    Nerve / Sites Muscle Latency Ref. Amplitude Ref. Rel Amp Segments Distance Velocity Ref. Area    ms ms mV mV %  cm m/s m/s mVms  R Median - APB     Wrist APB 3.4 <=4.4 8.6 >=4.0 100 Wrist - APB 7   31.7     Upper arm APB 7.3  9.0  104 Upper arm - Wrist 22 56 >=49 32.5  R Ulnar - ADM     Wrist ADM 2.5 <=3.3 9.2 >=6.0 100 Wrist - ADM 7   33.2     B.Elbow ADM 4.8  9.0  98.1 B.Elbow - Wrist 15 65 >=49 32.3     A.Elbow ADM 7.4  8.8  98.1 A.Elbow - B.Elbow 15 58 >=49 33.6  R Peroneal - EDB     Ankle EDB 5.4 <=6.5 2.9 >=2.0 100 Ankle - EDB 9   8.9     Fib head EDB 11.1  3.0  104 Fib head - Ankle 31 54 >=44 9.6     Pop fossa EDB 13.2  3.2  106 Pop  fossa - Fib head 11 53 >=44 9.2         Pop fossa - Ankle      R Tibial - AH     Ankle AH 5.7 <=5.8 17.3 >=4.0 100 Ankle - AH 9   46.6     Pop fossa AH 13.7  13.2  76.5 Pop fossa - Ankle 45.5 57 >=41 44.6             SNC    Nerve / Sites Rec. Site Peak Lat Ref.  Amp Ref. Segments Distance    ms ms V V  cm  R Sural - Ankle (Calf)     Calf Ankle 3.9 <=4.4 14 >=6 Calf - Ankle 14  R Superficial peroneal - Ankle     Lat leg Ankle 3.1 <=4.4 9 >=6 Lat leg - Ankle 14  R Median - Orthodromic (Dig II, Mid palm)     Dig II Wrist 3.1 <=3.4 34 >=10 Dig II - Wrist 13  R Ulnar - Orthodromic, (Dig V, Mid palm)     Dig V Wrist 2.6 <=3.1 27 >=5 Dig V - Wrist 11             F  Wave    Nerve F Lat Ref.   ms ms  R Ulnar - ADM 27.6 <=32.0  R Tibial - AH 52.9 <=56.0         EMG Summary Table    Spontaneous MUAP Recruitment  Muscle IA Fib PSW Fasc Other Amp Dur. Poly Pattern  R. Tibialis anterior Normal None None None _______ Normal Normal Normal Normal  R. Peroneus longus Normal None None None _______ Normal Normal Normal Normal  R. Tibialis posterior Normal None None None _______ Normal Normal Normal Normal  R. Gastrocnemius (Medial head) Normal None None None _______ Normal Normal Normal Normal  R. Vastus lateralis Normal None None None _______ Normal Normal Normal Normal  R. Lumbar paraspinals (low) Normal None None None _______ Normal Normal Normal Normal  R. Lumbar paraspinals (mid) Normal None None None _______ Normal Normal Normal Normal  R. First dorsal interosseous Normal None None None _______ Normal Normal Normal Normal  R. Pronator teres Normal None None None _______ Normal Normal Normal Normal  R. Biceps brachii Normal None None None _______ Normal Normal Normal Normal  R. Deltoid Normal None None None _______ Normal Normal Normal Normal  R. Triceps brachii Normal None None None _______ Normal Normal Normal Normal  R. Cervical paraspinals Normal None None None _______ Normal Normal  Normal Normal

## 2023-11-28 DIAGNOSIS — G901 Familial dysautonomia [Riley-Day]: Secondary | ICD-10-CM | POA: Diagnosis not present

## 2023-12-10 DIAGNOSIS — K2960 Other gastritis without bleeding: Secondary | ICD-10-CM | POA: Diagnosis not present

## 2023-12-10 DIAGNOSIS — E46 Unspecified protein-calorie malnutrition: Secondary | ICD-10-CM | POA: Diagnosis not present

## 2023-12-15 DIAGNOSIS — G43709 Chronic migraine without aura, not intractable, without status migrainosus: Secondary | ICD-10-CM | POA: Diagnosis not present

## 2023-12-15 DIAGNOSIS — G90A Postural orthostatic tachycardia syndrome (POTS): Secondary | ICD-10-CM | POA: Diagnosis not present

## 2023-12-15 DIAGNOSIS — K2960 Other gastritis without bleeding: Secondary | ICD-10-CM | POA: Diagnosis not present

## 2023-12-15 DIAGNOSIS — K909 Intestinal malabsorption, unspecified: Secondary | ICD-10-CM | POA: Diagnosis not present

## 2023-12-20 ENCOUNTER — Encounter (HOSPITAL_COMMUNITY): Payer: Self-pay

## 2023-12-20 ENCOUNTER — Emergency Department (HOSPITAL_COMMUNITY)

## 2023-12-20 ENCOUNTER — Emergency Department (HOSPITAL_COMMUNITY)
Admission: EM | Admit: 2023-12-20 | Discharge: 2023-12-20 | Discharge status: Against Medical Advice | Attending: Emergency Medicine | Admitting: Emergency Medicine

## 2023-12-20 ENCOUNTER — Other Ambulatory Visit: Payer: Self-pay

## 2023-12-20 DIAGNOSIS — R112 Nausea with vomiting, unspecified: Secondary | ICD-10-CM | POA: Insufficient documentation

## 2023-12-20 DIAGNOSIS — M7918 Myalgia, other site: Secondary | ICD-10-CM | POA: Diagnosis not present

## 2023-12-20 DIAGNOSIS — R1310 Dysphagia, unspecified: Secondary | ICD-10-CM | POA: Insufficient documentation

## 2023-12-20 DIAGNOSIS — Z5321 Procedure and treatment not carried out due to patient leaving prior to being seen by health care provider: Secondary | ICD-10-CM | POA: Insufficient documentation

## 2023-12-20 DIAGNOSIS — R519 Headache, unspecified: Secondary | ICD-10-CM | POA: Diagnosis not present

## 2023-12-20 LAB — CBC WITH DIFFERENTIAL/PLATELET
Abs Immature Granulocytes: 0.04 10*3/uL (ref 0.00–0.07)
Basophils Absolute: 0.1 10*3/uL (ref 0.0–0.1)
Basophils Relative: 1 %
Eosinophils Absolute: 0.3 10*3/uL (ref 0.0–0.5)
Eosinophils Relative: 3 %
HCT: 45.9 % (ref 39.0–52.0)
Hemoglobin: 15.1 g/dL (ref 13.0–17.0)
Immature Granulocytes: 0 %
Lymphocytes Relative: 23 %
Lymphs Abs: 2.2 10*3/uL (ref 0.7–4.0)
MCH: 29.3 pg (ref 26.0–34.0)
MCHC: 32.9 g/dL (ref 30.0–36.0)
MCV: 89.1 fL (ref 80.0–100.0)
Monocytes Absolute: 0.5 10*3/uL (ref 0.1–1.0)
Monocytes Relative: 5 %
Neutro Abs: 6.7 10*3/uL (ref 1.7–7.7)
Neutrophils Relative %: 68 %
Platelets: 330 10*3/uL (ref 150–400)
RBC: 5.15 MIL/uL (ref 4.22–5.81)
RDW: 12.5 % (ref 11.5–15.5)
WBC: 9.7 10*3/uL (ref 4.0–10.5)
nRBC: 0 % (ref 0.0–0.2)

## 2023-12-20 LAB — COMPREHENSIVE METABOLIC PANEL WITH GFR
ALT: 17 U/L (ref 0–44)
AST: 19 U/L (ref 15–41)
Albumin: 3.9 g/dL (ref 3.5–5.0)
Alkaline Phosphatase: 55 U/L (ref 38–126)
Anion gap: 9 (ref 5–15)
BUN: 19 mg/dL (ref 6–20)
CO2: 26 mmol/L (ref 22–32)
Calcium: 9.2 mg/dL (ref 8.9–10.3)
Chloride: 102 mmol/L (ref 98–111)
Creatinine, Ser: 1.17 mg/dL (ref 0.61–1.24)
GFR, Estimated: >60 mL/min (ref >60)
Glucose, Bld: 105 mg/dL — ABNORMAL HIGH (ref 70–99)
Potassium: 4.3 mmol/L (ref 3.5–5.1)
Sodium: 137 mmol/L (ref 135–145)
Total Bilirubin: 1.2 mg/dL (ref 0.0–1.2)
Total Protein: 6.9 g/dL (ref 6.5–8.1)

## 2023-12-20 LAB — MAGNESIUM: Magnesium: 2.1 mg/dL (ref 1.7–2.4)

## 2023-12-20 NOTE — ED Provider Triage Note (Signed)
 Emergency Medicine Provider Triage Evaluation Note  ELDREDGE VELDHUIZEN III , a 43 y.o. male  was evaluated in triage.  Pt complains of nausea, vomiting, severe headache.  Patient reports that he has been having full body cramping, difficulty swallowing, nausea, vomiting and posterior headache that started out of about 45 minutes prior to arriving.  He reports that the headache in the back of his head is been present for the last 2 days and has been worsening with time.  No prior history of similar headaches.  He reports a history of collagenous gastritis and this is typically the cause behind majority of his symptoms.  Review of Systems  Positive: As above Negative: As above  Physical Exam  BP 126/70   Pulse 90   Temp 98.7 F (37.1 C)   Resp 20   Ht 5\' 10"  (1.778 m)   Wt 65.8 kg   SpO2 99%   BMI 20.81 kg/m  Gen:   Awake, no distress   Resp:  Normal effort  MSK:   Moves extremities without difficulty  Other:    Medical Decision Making  Medically screening exam initiated at 2:36 PM.  Appropriate orders placed.  Rock Chum III was informed that the remainder of the evaluation will be completed by another provider, this initial triage assessment does not replace that evaluation, and the importance of remaining in the ED until their evaluation is complete.     Khaliel Morey A, PA-C 12/20/23 1439

## 2023-12-20 NOTE — ED Notes (Addendum)
 Patient left said he didn't want to wait any longer

## 2023-12-20 NOTE — ED Triage Notes (Signed)
 Pt c.o full body cramping, difficulty swallowing, n/v and posterior headache pain that started about 45 mins PTA.

## 2023-12-23 ENCOUNTER — Telehealth: Payer: Self-pay

## 2023-12-23 ENCOUNTER — Ambulatory Visit (INDEPENDENT_AMBULATORY_CARE_PROVIDER_SITE_OTHER): Admitting: Neurology

## 2023-12-23 ENCOUNTER — Telehealth: Payer: Self-pay | Admitting: Neurology

## 2023-12-23 ENCOUNTER — Encounter: Payer: Self-pay | Admitting: Neurology

## 2023-12-23 VITALS — BP 108/69 | HR 80 | Ht 70.0 in | Wt 142.0 lb

## 2023-12-23 DIAGNOSIS — R202 Paresthesia of skin: Secondary | ICD-10-CM

## 2023-12-23 DIAGNOSIS — G43709 Chronic migraine without aura, not intractable, without status migrainosus: Secondary | ICD-10-CM | POA: Diagnosis not present

## 2023-12-23 DIAGNOSIS — H539 Unspecified visual disturbance: Secondary | ICD-10-CM

## 2023-12-23 NOTE — Telephone Encounter (Signed)
 Notied by Rosia Cook that patient refused lab work today

## 2023-12-23 NOTE — Progress Notes (Addendum)
 Chief Complaint  Patient presents with   Migraine    Rm14, wife present, Migraine: 0/30, has: constant, his symptoms worsened pain in back of head 24/7 worse at times, increasing weakness (bilateral legs/arms), the numbness and tingly feeling has evolved into nerve pain (bilateral legs/arms/neck back), issues breathing(has to force self to breathe) and swallowing difficulty (the communication from the brain to the mechanism that helps swallow seems not to be sending msg according to pt)      ASSESSMENT AND PLAN  Manuel [name] is a 43 y.o. male   Chronic migraine  Normal MRI of the brain without contrast from [facility] November 04, 2023,  Intermittent upper and lower extremity paresthesia  Essentially normal neurological examinations  Normal electrodiagnostic study  Extensive laboratory evaluation showed decreased vitamin levels, is under treatment.  Transient swallowing difficulty, desire complete evaluation, proceed with myasthenia gravis panel, to rule out neuromuscular junctional disorder,  I have offered Cymbalta, gabapentin for symptomatic treatment, he denies anxiety, will refer him to [facility] neurologist.    DIAGNOSTIC DATA (LABS, IMAGING, TESTING) - I reviewed patient records, labs, notes, testing and imaging myself where available.   MEDICAL HISTORY:  Manuel [name], is a 43 year old male seen in request by emergency for evaluation of visual disturbance, headache, initial evaluation was on June 26, 2023  History is obtained from the patient and review of electronic medical records. I personally reviewed pertinent available imaging films in PACS.   PMHx of  Testicular cancer in 2006, s/p orchiectomy radiation therapy. Collagenous gastritis  Baroreceptor failure  He presented to the emergency room June 23, 2023, symptoms started around 1145 during his workout, he felt dizziness, light sensitivity, low energy, he went to his car drink some water, was  able to finish workout, eat lunch, 1 hour later, he began to noticed lightheadedness, dizziness, decreased bilateral peripheral vision, bright spot in his right visual field, blocking his vision, he also complains of right hand numbness, clumsiness, this is different from his previous migraine  He did develop severe headaches, with light noise sensitivity, he went to sleep woke up next morning, headache is better, but still have some visual concerns, presented to the emergency room CT head without contrast was normal  He had 1 similar episode with prolonged visual disturbance, but not as severe  CT abdomen pelvic in September 2024 without contrast for evaluation of weight loss, history of testicular cancer showed no significant abnormality  He had a history of testicular cancer in 2006, status post orchiectomy, followed by radiation therapy from neck to pelvic region,  He has migraine headaches every couple months, typical migraine are retro-orbital area headache with light noise sensitivity, usually relieved by ice pack, hydration, sleep, she has not taking any prescription medicine for that  He has a history of migraines since teens, remembering middle school go to school nurse because seeing black-white checkbox in his visual field, nauseous, urged to use bathroom, variable degree of headache  Trigger for his migraine smell, strong perfume, light, alcohol, corn,  UPDATE March 26th 2025: He is accompanied by his wife at today's visit, multiple MyChart message since last visit complains of intermittent upper and lower extremity paresthesia, electrodiagnostic study today is essentially normal, there was no evidence of large fiber peripheral neuropathy right cervical or lumbosacral radiculopathy Symptoms present most of the time, he has to take Tylenol  as needed, used ice pack frequently  Laboratory evaluation in February 2025 showed mildly decreased vitamin D 22, normal  B12 396, TSH, ferritin,  iron level, BMP, CBC, hemoglobin 15.1,  From [facility] November 20, 2023: Folic acid  was mildly decreased 2.4, vitamin C was decreased 0.2, normal vitamin A , vitamin D level was also decreased 0.3, normal zinc , lactate dehydrogenase, negative Lyme titer, normal vitamin K, cortisol level, negative anti-neurotrophic cytoplasmic antibody, Lyme titer, normal C-reactive protein, CPK,  UPDATE December 23 2023: He is accompanied by his wife at today's clinical visit, complains of shooting pain at both arms and legs, feeling cold, difficulty sleeping, presenting to emergency room December 20, 2023, difficulty swallowing, breathing,  Had a CT head without contrast that was normal  Laboratory evaluation showed normal magnesium CBC, CMP, B12, ferritin of 50,  Extensive lab at [facility] showed normal ferritin, B6, B7, pantothenic acid, B1, B12 B3, homocystine, tryptase, hepatal globin, vitamin A , zinc , mildly low vitamin D 23, folic acid  2.4, vitamin C of 0.2, vitamin E of 0.3,  PHYSICAL EXAM:   Vitals:   12/23/23 1123  BP: 108/69  Pulse: 80  Weight: 142 lb (64.4 kg)  Height: 5\' 10"  (1.778 m)   Body mass index is 20.37 kg/m.  PHYSICAL EXAMNIATION:  Gen: NAD, conversant, well nourised, well groomed                     Cardiovascular: Regular rate rhythm, no peripheral edema, warm, nontender. Eyes: Conjunctivae clear without exudates or hemorrhage Neck: Supple, no carotid bruits. Pulmonary: Clear to auscultation bilaterally   NEUROLOGICAL EXAM:  MENTAL STATUS: Speech/cognition: Awake, alert, oriented to history taking and casual conversation CRANIAL NERVES: CN II: Visual fields are full to confrontation. Pupils are round equal and briskly reactive to light. CN [name], IV, VI: extraocular movement are normal. No ptosis. CN V: Facial sensation is intact to light touch CN VII: Face is symmetric with normal eye closure  CN VIII: Hearing is normal to causal conversation. CN IX, X: Phonation is  normal. CN XI: Head turning and shoulder shrug are intact  MOTOR: There is no pronator drift of out-stretched arms. Muscle bulk and tone are normal. Muscle strength is normal.  REFLEXES: Reflexes are 2+ and symmetric at the biceps, triceps, knees, and ankles. Plantar responses are flexor.  SENSORY: Intact to light touch, pinprick and vibratory sensation are intact in fingers and toes.  COORDINATION: There is no trunk or limb dysmetria noted.  GAIT/STANCE: Posture is normal. Gait is steady with normal steps, base, arm swing, and turning. Heel and toe walking are normal. Tandem gait is normal.  Romberg is absent.  REVIEW OF SYSTEMS:  Full 14 system review of systems performed and notable only for as above All other review of systems were negative.   ALLERGIES: Allergies  Allergen Reactions   Codeine Nausea Only and Anaphylaxis    GI upset   Contrast Media [Iodinated Contrast Media] Anaphylaxis   Erythromycin Hives, Itching and Anaphylaxis   Iodine Anaphylaxis, Hives, Other (See Comments) and Shortness Of Breath    Other reaction(s): anaphylaxis   Shellfish Allergy Anaphylaxis   Azithromycin Dermatitis and Other (See Comments)   Doxycycline  Nausea And Vomiting   Corn-Containing Products Nausea And Vomiting   Dilaudid [Hydromorphone Hcl] Other (See Comments)    Nausea   Dilaudid [Hydromorphone]     Other reaction(s): urinary retention   Etodolac Other (See Comments)   Ioversol    Nsaids Other (See Comments)    Other reaction(s): Palpitations (01/2015)   Tetracyclines & Related    Prednisone  Anxiety    Stomach  upset    HOME MEDICATIONS: No current outpatient medications on file.   No current facility-administered medications for this visit.    PAST MEDICAL HISTORY: Past Medical History:  Diagnosis Date   Bilateral flank pain 07/17/2012   Complication of lumbar puncture 10/05/2015   Concussion    pt reports he has hx of multiple concussions, last one was 2009    Headache 10/05/2015   Hypotension    Left sided numbness 04/24/2012   Migraine    Nausea without vomiting 10/05/2015   POTS (postural orthostatic tachycardia syndrome)    Seizure (HCC)    Subacute confusional state 10/05/2015   Syncope 04/24/2012   Testicular cancer (HCC) 2006   Traumatic CSF leak 10/05/2015   Viral meningitis 04/08/2014   Vision changes 10/05/2015    PAST SURGICAL HISTORY: Past Surgical History:  Procedure Laterality Date   BIOPSY  02/11/2023   Procedure: BIOPSY;  Surgeon: Ozell Blunt, MD;  Location: [facility];  Service: Gastroenterology;;   ESOPHAGOGASTRODUODENOSCOPY N/A 02/11/2023   Procedure: ESOPHAGOGASTRODUODENOSCOPY (EGD);  Surgeon: Ozell Blunt, MD;  Location: [facility];  Service: Gastroenterology;  Laterality: N/A;   ORCHIECTOMY     ROTATOR CUFF REPAIR     SURGERY SCROTAL / TESTICULAR  2006    FAMILY HISTORY: Family History  Problem Relation Age of Onset   Migraines Mother    Diabetes type II Father     SOCIAL HISTORY: Social History   Socioeconomic History   Marital status: Married    Spouse name: Mylinda Asa   Number of children: 2   Years of education: 16   Highest education level: Not on file  Occupational History   Occupation: Airline pilot  Tobacco Use   Smoking status: Never   Smokeless tobacco: Never  Substance and Sexual Activity   Alcohol use: Not Currently   Drug use: No   Sexual activity: Not on file  Other Topics Concern   Not on file  Social History Narrative   Lives with wife and kids   Caffeine use: none   Social Drivers of Health   Financial Resource Strain: Patient Declined (09/16/2023)   Received from [facility]   Overall Financial Resource Strain (CARDIA)    Difficulty of Paying Living Expenses: Patient declined  Food Insecurity: No Food Insecurity (09/16/2023)   Received from [facility]   Hunger Vital Sign    Worried About Running Out of Food in the Last Year: Never true    Ran Out of Food in the Last Year: Never true   Transportation Needs: No Transportation Needs (09/16/2023)   Received from [facility] - Transportation    Lack of Transportation (Medical): No    Lack of Transportation (Non-Medical): No  Physical Activity: Sufficiently Active (09/16/2023)   Received from [facility]   Exercise Vital Sign    Days of Exercise per Week: 3 days    Minutes of Exercise per Session: 60 min  Stress: Stress Concern Present (09/16/2023)   Received from [facility] - Occupational Stress Questionnaire    Feeling of Stress : To some extent  Social Connections: Moderately Integrated (09/16/2023)   Received from [facility]   Social Network    How would you rate your social network (family, work, friends)?: Adequate participation with social networks  Intimate Partner Violence: Not At Risk (09/16/2023)   Received from [facility]   HITS    Over the last 12 months how often did your partner  physically hurt you?: Never    Over the last 12 months how often did your partner insult you or talk down to you?: Never    Over the last 12 months how often did your partner threaten you with physical harm?: Never    Over the last 12 months how often did your partner scream or curse at you?: Never      Phebe Brasil, M.D. Ph.D.  [facility] [address], [address] [place], [place] [address] Ph: [phone] Fax: [phone]  CC:  Beam, Conway Dennis, MD [address] [place],  [place] [address]  Beam, Conway Dennis, MD

## 2023-12-23 NOTE — Telephone Encounter (Signed)
 Referral for neurology fax to [facility]. Phone: [phone], Fax:[phone]

## 2023-12-25 NOTE — Telephone Encounter (Signed)
 Please Inform Patient That I can find no published research on gammaCore sapphire and dysautonomia -- if you can help me find something that would be great--but without that I can't write the prescription for you on that Thanks

## 2024-01-02 DIAGNOSIS — K909 Intestinal malabsorption, unspecified: Secondary | ICD-10-CM | POA: Diagnosis not present

## 2024-01-02 DIAGNOSIS — K2960 Other gastritis without bleeding: Secondary | ICD-10-CM | POA: Diagnosis not present

## 2024-01-07 DIAGNOSIS — G43709 Chronic migraine without aura, not intractable, without status migrainosus: Secondary | ICD-10-CM | POA: Diagnosis not present

## 2024-01-07 DIAGNOSIS — R202 Paresthesia of skin: Secondary | ICD-10-CM | POA: Diagnosis not present

## 2024-01-14 ENCOUNTER — Ambulatory Visit: Attending: Student

## 2024-01-14 DIAGNOSIS — G90A Postural orthostatic tachycardia syndrome (POTS): Secondary | ICD-10-CM

## 2024-01-14 NOTE — Progress Notes (Unsigned)
 Enrolled patient for a 14 day Zio XT monitor to be mailed to patients home  Manuel [name] to read

## 2024-01-15 DIAGNOSIS — R200 Anesthesia of skin: Secondary | ICD-10-CM | POA: Diagnosis not present

## 2024-01-15 DIAGNOSIS — R1310 Dysphagia, unspecified: Secondary | ICD-10-CM | POA: Diagnosis not present

## 2024-01-20 NOTE — Telephone Encounter (Signed)
 Received a call back from [facility]. They let me know that before accepting this referral - they are needing to know which specific specialty Dr Gracie Lav is wanting the patient to see, ex. Neuromuscular, etc. Per the office, the providers are cracking down on this and will decline referrals that do not state a specific speciality.   Referral can either be re-faxed with clarification or they can be reached back at [phone].

## 2024-01-22 ENCOUNTER — Ambulatory Visit: Payer: BC Managed Care – PPO | Admitting: Neurology

## 2024-01-22 DIAGNOSIS — M79604 Pain in right leg: Secondary | ICD-10-CM

## 2024-01-27 NOTE — Addendum Note (Signed)
 Addended by: Latoshia Monrroy on: 01/27/2024 08:50 AM   Modules accepted: Orders

## 2024-01-27 NOTE — Telephone Encounter (Signed)
 Referral resent to [facility] - General Neurology. Phone: [phone], Fax: [phone]

## 2024-01-27 NOTE — Telephone Encounter (Signed)
 Orders Placed This Encounter  Procedures   Ambulatory referral to Neurology     General Neurology Refer to [facility] neurologist

## 2024-01-28 ENCOUNTER — Ambulatory Visit (HOSPITAL_COMMUNITY)
Admission: RE | Admit: 2024-01-28 | Discharge: 2024-01-28 | Disposition: A | Discharge status: Home / Self Care | Source: Ambulatory Visit | Attending: Student | Admitting: Student

## 2024-01-28 ENCOUNTER — Ambulatory Visit: Payer: Self-pay | Admitting: Student

## 2024-01-28 DIAGNOSIS — M79605 Pain in left leg: Secondary | ICD-10-CM | POA: Diagnosis not present

## 2024-01-28 DIAGNOSIS — M79604 Pain in right leg: Secondary | ICD-10-CM | POA: Diagnosis not present

## 2024-01-28 LAB — IMMUNOFIXATION ELECTROPHORESIS
IgA/Immunoglobulin A, Serum: 253 mg/dL (ref 90–386)
IgG (Immunoglobin G), Serum: 889 mg/dL (ref 603–1613)
IgM (Immunoglobulin M), Srm: 95 mg/dL (ref 20–172)
Total Protein: 6.6 g/dL (ref 6.0–8.5)

## 2024-01-28 LAB — VAS US ABI WITH/WO TBI
Left ABI: 1.25
Right ABI: 1.24

## 2024-01-28 LAB — ANA W/REFLEX IF POSITIVE: Anti Nuclear Antibody (ANA): NEGATIVE

## 2024-01-28 LAB — ACHR ALL WITH REFLEX TO MUSK
AChR Binding Ab, Serum: <0.07 nmol/L (ref 0.00–0.24)
AChR-modulating Ab: 1 % (ref 0–45)
Acetylchol Block Ab: 19 % (ref 0–25)

## 2024-01-28 LAB — SEDIMENTATION RATE: Sed Rate: 2 mm/h (ref 0–15)

## 2024-01-28 LAB — RPR: RPR Ser Ql: NONREACTIVE

## 2024-01-28 LAB — HIV ANTIBODY (ROUTINE TESTING W REFLEX): HIV Screen 4th Generation wRfx: NONREACTIVE

## 2024-01-28 LAB — MUSK ANTIBODIES: MuSK Antibodies: <1 U/mL

## 2024-02-02 ENCOUNTER — Ambulatory Visit: Payer: Self-pay | Admitting: Neurology

## 2024-02-03 DIAGNOSIS — K2960 Other gastritis without bleeding: Secondary | ICD-10-CM | POA: Diagnosis not present

## 2024-02-03 DIAGNOSIS — E46 Unspecified protein-calorie malnutrition: Secondary | ICD-10-CM | POA: Diagnosis not present

## 2024-02-06 DIAGNOSIS — K2960 Other gastritis without bleeding: Secondary | ICD-10-CM | POA: Diagnosis not present

## 2024-02-06 DIAGNOSIS — K30 Functional dyspepsia: Secondary | ICD-10-CM | POA: Diagnosis not present

## 2024-02-06 DIAGNOSIS — R634 Abnormal weight loss: Secondary | ICD-10-CM | POA: Diagnosis not present

## 2024-02-09 DIAGNOSIS — I7300 Raynaud's syndrome without gangrene: Secondary | ICD-10-CM | POA: Diagnosis not present

## 2024-02-09 DIAGNOSIS — M7918 Myalgia, other site: Secondary | ICD-10-CM | POA: Diagnosis not present

## 2024-02-09 DIAGNOSIS — D8989 Other specified disorders involving the immune mechanism, not elsewhere classified: Secondary | ICD-10-CM | POA: Diagnosis not present

## 2024-02-09 DIAGNOSIS — R5383 Other fatigue: Secondary | ICD-10-CM | POA: Diagnosis not present

## 2024-02-09 DIAGNOSIS — R202 Paresthesia of skin: Secondary | ICD-10-CM | POA: Diagnosis not present

## 2024-02-12 DIAGNOSIS — G629 Polyneuropathy, unspecified: Secondary | ICD-10-CM | POA: Diagnosis not present

## 2024-02-17 DIAGNOSIS — K2960 Other gastritis without bleeding: Secondary | ICD-10-CM | POA: Diagnosis not present

## 2024-02-17 DIAGNOSIS — R634 Abnormal weight loss: Secondary | ICD-10-CM | POA: Diagnosis not present

## 2024-02-17 DIAGNOSIS — R112 Nausea with vomiting, unspecified: Secondary | ICD-10-CM | POA: Diagnosis not present

## 2024-02-17 DIAGNOSIS — D509 Iron deficiency anemia, unspecified: Secondary | ICD-10-CM | POA: Diagnosis not present

## 2024-02-17 DIAGNOSIS — G90A Postural orthostatic tachycardia syndrome (POTS): Secondary | ICD-10-CM | POA: Diagnosis not present

## 2024-02-25 ENCOUNTER — Observation Stay (HOSPITAL_BASED_OUTPATIENT_CLINIC_OR_DEPARTMENT_OTHER)
Admission: EM | Admit: 2024-02-25 | Discharge: 2024-02-26 | Disposition: A | Discharge status: Home / Self Care | Attending: Emergency Medicine | Admitting: Emergency Medicine

## 2024-02-25 ENCOUNTER — Emergency Department (HOSPITAL_BASED_OUTPATIENT_CLINIC_OR_DEPARTMENT_OTHER)

## 2024-02-25 ENCOUNTER — Encounter (HOSPITAL_COMMUNITY): Payer: Self-pay | Admitting: Internal Medicine

## 2024-02-25 ENCOUNTER — Other Ambulatory Visit: Payer: Self-pay

## 2024-02-25 DIAGNOSIS — Z8679 Personal history of other diseases of the circulatory system: Secondary | ICD-10-CM | POA: Insufficient documentation

## 2024-02-25 DIAGNOSIS — G43709 Chronic migraine without aura, not intractable, without status migrainosus: Secondary | ICD-10-CM | POA: Diagnosis present

## 2024-02-25 DIAGNOSIS — K2960 Other gastritis without bleeding: Secondary | ICD-10-CM | POA: Diagnosis not present

## 2024-02-25 DIAGNOSIS — R202 Paresthesia of skin: Secondary | ICD-10-CM | POA: Insufficient documentation

## 2024-02-25 DIAGNOSIS — R112 Nausea with vomiting, unspecified: Secondary | ICD-10-CM | POA: Diagnosis not present

## 2024-02-25 DIAGNOSIS — C6290 Malignant neoplasm of unspecified testis, unspecified whether descended or undescended: Secondary | ICD-10-CM | POA: Diagnosis present

## 2024-02-25 DIAGNOSIS — Z8547 Personal history of malignant neoplasm of testis: Secondary | ICD-10-CM | POA: Diagnosis not present

## 2024-02-25 DIAGNOSIS — Z743 Need for continuous supervision: Secondary | ICD-10-CM | POA: Diagnosis not present

## 2024-02-25 DIAGNOSIS — R079 Chest pain, unspecified: Secondary | ICD-10-CM | POA: Diagnosis not present

## 2024-02-25 DIAGNOSIS — R634 Abnormal weight loss: Secondary | ICD-10-CM | POA: Diagnosis not present

## 2024-02-25 DIAGNOSIS — R55 Syncope and collapse: Secondary | ICD-10-CM | POA: Diagnosis not present

## 2024-02-25 DIAGNOSIS — I495 Sick sinus syndrome: Secondary | ICD-10-CM | POA: Insufficient documentation

## 2024-02-25 DIAGNOSIS — G608 Other hereditary and idiopathic neuropathies: Secondary | ICD-10-CM | POA: Insufficient documentation

## 2024-02-25 DIAGNOSIS — R002 Palpitations: Secondary | ICD-10-CM | POA: Diagnosis not present

## 2024-02-25 LAB — COMPREHENSIVE METABOLIC PANEL WITH GFR
ALT: 15 U/L (ref 0–44)
AST: 17 U/L (ref 15–41)
Albumin: 4.5 g/dL (ref 3.5–5.0)
Alkaline Phosphatase: 71 U/L (ref 38–126)
Anion gap: 10 (ref 5–15)
BUN: 19 mg/dL (ref 6–20)
CO2: 27 mmol/L (ref 22–32)
Calcium: 9.8 mg/dL (ref 8.9–10.3)
Chloride: 101 mmol/L (ref 98–111)
Creatinine, Ser: 1.07 mg/dL (ref 0.61–1.24)
GFR, Estimated: >60 mL/min (ref >60)
Glucose, Bld: 94 mg/dL (ref 70–99)
Potassium: 4 mmol/L (ref 3.5–5.1)
Sodium: 139 mmol/L (ref 135–145)
Total Bilirubin: 0.7 mg/dL (ref 0.0–1.2)
Total Protein: 7.3 g/dL (ref 6.5–8.1)

## 2024-02-25 LAB — CBC WITH DIFFERENTIAL/PLATELET
Abs Immature Granulocytes: 0.03 10*3/uL (ref 0.00–0.07)
Basophils Absolute: 0.1 10*3/uL (ref 0.0–0.1)
Basophils Relative: 1 %
Eosinophils Absolute: 0.4 10*3/uL (ref 0.0–0.5)
Eosinophils Relative: 4 %
HCT: 44.3 % (ref 39.0–52.0)
Hemoglobin: 15.2 g/dL (ref 13.0–17.0)
Immature Granulocytes: 0 %
Lymphocytes Relative: 30 %
Lymphs Abs: 2.7 10*3/uL (ref 0.7–4.0)
MCH: 30 pg (ref 26.0–34.0)
MCHC: 34.3 g/dL (ref 30.0–36.0)
MCV: 87.4 fL (ref 80.0–100.0)
Monocytes Absolute: 0.6 10*3/uL (ref 0.1–1.0)
Monocytes Relative: 6 %
Neutro Abs: 5.2 10*3/uL (ref 1.7–7.7)
Neutrophils Relative %: 59 %
Platelets: 316 10*3/uL (ref 150–400)
RBC: 5.07 MIL/uL (ref 4.22–5.81)
RDW: 12.5 % (ref 11.5–15.5)
WBC: 8.9 10*3/uL (ref 4.0–10.5)
nRBC: 0 % (ref 0.0–0.2)

## 2024-02-25 LAB — CBC
HCT: 40.5 % (ref 39.0–52.0)
Hemoglobin: 13.7 g/dL (ref 13.0–17.0)
MCH: 29.5 pg (ref 26.0–34.0)
MCHC: 33.8 g/dL (ref 30.0–36.0)
MCV: 87.3 fL (ref 80.0–100.0)
Platelets: 302 10*3/uL (ref 150–400)
RBC: 4.64 MIL/uL (ref 4.22–5.81)
RDW: 12.3 % (ref 11.5–15.5)
WBC: 9.2 10*3/uL (ref 4.0–10.5)
nRBC: 0 % (ref 0.0–0.2)

## 2024-02-25 LAB — D-DIMER, QUANTITATIVE: D-Dimer, Quant: <0.27 ug{FEU}/mL (ref 0.00–0.50)

## 2024-02-25 LAB — CREATININE, SERUM
Creatinine, Ser: 1.01 mg/dL (ref 0.61–1.24)
GFR, Estimated: >60 mL/min (ref >60)

## 2024-02-25 LAB — TROPONIN T, HIGH SENSITIVITY
Troponin T High Sensitivity: <15 ng/L (ref <19)
Troponin T High Sensitivity: <15 ng/L (ref <19)

## 2024-02-25 MED ORDER — SODIUM CHLORIDE 0.9 % IV BOLUS
1000.0000 mL | Freq: Once | INTRAVENOUS | Status: AC
  Administered 2024-02-25: 1000 mL via INTRAVENOUS

## 2024-02-25 MED ORDER — GABAPENTIN 300 MG PO CAPS
300.0000 mg | ORAL_CAPSULE | Freq: Three times a day (TID) | ORAL | Status: DC
  Filled 2024-02-25 (×2): qty 1

## 2024-02-25 MED ORDER — ONDANSETRON HCL 4 MG PO TABS: 4.0000 mg | ORAL_TABLET | Freq: Four times a day (QID) | ORAL | Status: DC | PRN

## 2024-02-25 MED ORDER — ONDANSETRON HCL 4 MG/2ML IJ SOLN: 4.0000 mg | Freq: Four times a day (QID) | INTRAMUSCULAR | Status: DC | PRN

## 2024-02-25 MED ORDER — ALPRAZOLAM 0.25 MG PO TABS: 0.2500 mg | ORAL_TABLET | Freq: Three times a day (TID) | ORAL | Status: DC | PRN

## 2024-02-25 MED ORDER — BUDESONIDE 3 MG PO CPEP
9.0000 mg | ORAL_CAPSULE | Freq: Every day | ORAL | Status: DC
  Filled 2024-02-25: qty 3

## 2024-02-25 MED ORDER — SODIUM CHLORIDE 0.9% FLUSH
3.0000 mL | Freq: Two times a day (BID) | INTRAVENOUS | Status: DC
  Administered 2024-02-25 – 2024-02-26 (×2): 3 mL via INTRAVENOUS

## 2024-02-25 MED ORDER — LACTATED RINGERS IV SOLN: INTRAVENOUS | Status: DC

## 2024-02-25 MED ORDER — FAMOTIDINE 20 MG PO TABS
20.0000 mg | ORAL_TABLET | Freq: Every day | ORAL | Status: DC
  Filled 2024-02-25: qty 1

## 2024-02-25 MED ORDER — ENOXAPARIN SODIUM 40 MG/0.4ML IJ SOSY
40.0000 mg | PREFILLED_SYRINGE | INTRAMUSCULAR | Status: DC
  Filled 2024-02-25: qty 0.4

## 2024-02-25 NOTE — Consult Note (Signed)
 Cardiology Consultation   Patient ID: [name] MRN: [id number]; DOB: 1981/04/14  Admit date: 02/25/2024 Date of Consult: 02/25/2024  PCP:  [name], MD   [facility] Providers Cardiologist:  None  Electrophysiologist:  Elspeth Sage, MD       Patient Profile: Manuel [name] is a 43 y.o. male with a hx of f small fiber neuropathy, history of testicular cancer, POTS, seizures  who is being seen 02/25/2024 for the evaluation of Syncope at the request of Dr Sim.  History of Present Illness: Manuel [name] is a 43 y.o. male with a hx of f small fiber neuropathy, history of testicular cancer, POTS, seizures  who is being seen 02/25/2024 for the evaluation of Syncope.  Reports 2 episodes of syncope-he is a poor historian.  Goes tangential to a lot of symptoms.  Reports he felt chest pain, fatigue and lethargic and then passed out but he remembers passing out and events prior to that.  Never had syncope before. He has been battling multiple symptoms such as extreme fatigue, dizziness, weakness and tiredness for the e last several months, multiple evaluation with neurology.  Small fiber neuropathy and may be autonomic dysfunction, also has collagenous gastritis which is also affecting he also thinks he is vitamin D deficient and he takes a lot of multivitamin and supplements. Currently he is not taking any medication for vasovagal symptoms.  He remains hydrated. Interestingly 6 months ago he was playing tennis and was very active and all this started recently  He also reports while he was in the ER at [facility] he had a lot of pauses he could sleep his heart stopping and then restarting but in between he was feeling very fatigued and lethargic.  There is nothing telemetry found.  EKG and strip shows PAC.  Previously seen Dr Sage- most recent was 11/2023: has collagenous gastritis. He was talking about a stimulator device to Dr Sage. Previously he responded  to liberal salt intake and compression and then there was thought of taking fludrocortisone  and acebutolol , as needed clonidine .  However patient never took it EKG: NSR ECHO: 10/2023- LVEF 60%, no valvular abn  Recent patch monitor as below Patch Wear Time:  13 days and 11 hours (2025-05-25T12:56:41-0400 to 2025-06-08T00:03:49-0400)   Patient had a min HR of 47 bpm, max HR of 135 bpm, and avg HR of 68 bpm. Predominant underlying rhythm was Sinus Rhythm. Isolated SVEs were rare (<1.0%), and no SVE Couplets or SVE Triplets were present. Isolated VEs were rare (<1.0%), and no VE Couplets  or VE Triplets were present.    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   Viral meningitis 04/08/2014   Vision changes 10/05/2015    Past Surgical History:  Procedure Laterality Date   BIOPSY  02/11/2023   Procedure: BIOPSY;  Surgeon: Rosalie Kitchens, MD;  Location: [facility];  Service: Gastroenterology;;   ESOPHAGOGASTRODUODENOSCOPY N/A 02/11/2023   Procedure: ESOPHAGOGASTRODUODENOSCOPY (EGD);  Surgeon: Rosalie Kitchens, MD;  Location: [facility];  Service: Gastroenterology;  Laterality: N/A;   ORCHIECTOMY     ROTATOR CUFF REPAIR     SURGERY SCROTAL / TESTICULAR  2006     Home Medications:  Prior to Admission medications   Medication Sig Start Date End Date Taking? Authorizing Provider  ALPRAZolam (XANAX) 0.25 MG tablet Take 0.25 mg by mouth 3 (three) times daily as needed for anxiety. 01/27/24  Yes [provider]  budesonide (ENTOCORT EC) 3  MG 24 hr capsule Take 9 mg by mouth daily. 02/17/24 03/18/24 Yes [provider]  famotidine  (PEPCID ) 20 MG tablet Take 20 mg by mouth daily. 01/15/24  Yes [provider]  gabapentin (NEURONTIN) 300 MG capsule Take 300 mg by mouth 3 (three) times daily.    [provider]    Scheduled Meds:  Continuous Infusions:  PRN Meds:   Allergies:    Allergies  Allergen Reactions   Codeine Nausea Only and Anaphylaxis    GI upset   Contrast Media [Iodinated Contrast Media] Anaphylaxis   Erythromycin Hives, Itching and Anaphylaxis   Iodine Anaphylaxis, Hives, Other (See Comments) and Shortness Of Breath    Other reaction(s): anaphylaxis   Shellfish Allergy Anaphylaxis   Azithromycin Dermatitis and Other (See Comments)   Doxycycline  Nausea And Vomiting   Corn-Containing Products Nausea And Vomiting   Dilaudid [Hydromorphone Hcl] Other (See Comments)    Nausea   Dilaudid [Hydromorphone]     Other reaction(s): urinary retention   Etodolac Other (See Comments)   Ioversol    Nsaids Other (See Comments)    Other reaction(s): Palpitations (01/2015)   Tetracyclines & Related    Prednisone  Anxiety    Stomach upset    Social History:   Social History   Socioeconomic History   Marital status: Married    Spouse name: Alan   Number of children: 2   Years of education: 16   Highest education level: Not on file  Occupational History   Occupation: Airline pilot  Tobacco Use   Smoking status: Never   Smokeless tobacco: Never  Substance and Sexual Activity   Alcohol use: Not Currently   Drug use: No   Sexual activity: Not on file  Other Topics Concern   Not on file  Social History Narrative   Lives with wife and kids   Caffeine use: none   Social Drivers of Health   Financial Resource Strain: Patient Declined (09/16/2023)   Received from [facility]   Overall Financial Resource Strain (CARDIA)    Difficulty of Paying Living Expenses: Patient declined  Food  Insecurity: No Food Insecurity (09/16/2023)   Received from [facility]   Hunger Vital Sign    Within the past 12 months, you worried that your food would run out before you got the money to buy more.: Never true    Within the past 12 months, the food you bought just didn't last and you didn't have money to get more.: Never true  Transportation Needs: No Transportation Needs (09/16/2023)   Received from [facility] - Transportation    Lack of Transportation (Medical): No    Lack of Transportation (Non-Medical): No  Physical Activity: Sufficiently Active (09/16/2023)   Received from [facility]   Exercise Vital Sign    On average, how many days per week do you engage in moderate to strenuous exercise (like a brisk walk)?: 3 days    On average, how many minutes do you  engage in exercise at this level?: 60 min  Stress: Stress Concern Present (09/16/2023)   Received from [facility] - Occupational Stress Questionnaire    Feeling of Stress : To some extent  Social Connections: Moderately Integrated (09/16/2023)   Received from [facility]   Social Network    How would you rate your social network (family, work, friends)?: Adequate participation with social networks  Intimate Partner Violence: Not At Risk (09/16/2023)   Received from [facility]   HITS    Over the last 12 months how often did your partner physically hurt you?: Never    Over the last 12 months how often did your partner insult you or talk down to you?: Never    Over the last 12 months how often did your partner threaten you with physical harm?: Never    Over the last 12 months how often did your partner scream or curse at you?: Never    Family History:    Family History  Problem Relation Age of Onset   Migraines Mother    Diabetes type II Father      ROS:  Please see the history of present illness.   All other ROS reviewed and negative.     Physical  Exam/Data: Vitals:   02/25/24 2000 02/25/24 2015 02/25/24 2122 02/25/24 2127  BP: 117/80 121/89  113/79  Pulse: 70 77 77   Resp: 13 14 18    Temp:   98.4 F (36.9 C)   TempSrc:   Oral   SpO2: 100% 100% 98%     Intake/Output Summary (Last 24 hours) at 02/25/2024 2216 Last data filed at 02/25/2024 1856 Gross per 24 hour  Intake 1000 ml  Output --  Net 1000 ml      12/23/2023   11:23 AM 12/20/2023    2:17 PM 11/26/2023    2:09 PM  Last 3 Weights  Weight (lbs) 142 lb 145 lb 146 lb  Weight (kg) 64.411 kg 65.772 kg 66.225 kg     There is no height or weight on file to calculate BMI.  General:  Well nourished, well developed, in no acute distress HEENT: normal Neck: no JVD Vascular: No carotid bruits; Distal pulses 2+ bilaterally Cardiac:  normal S1, S2; RRR; no murmur  Lungs:  clear to auscultation bilaterally, no wheezing, rhonchi or rales  Abd: soft, nontender, no hepatomegaly  Ext: no edema Musculoskeletal:  No deformities, BUE and BLE strength normal and equal Skin: warm and dry  Neuro:  CNs 2-12 intact, no focal abnormalities noted Psych:  Normal affect     Laboratory Data: High Sensitivity Troponin:  No results for input(s): TROPONINIHS in the last 720 hours.   Chemistry Recent Labs  Lab 02/25/24 1714  NA 139  K 4.0  CL 101  CO2 27  GLUCOSE 94  BUN 19  CREATININE 1.07  CALCIUM 9.8  GFRNONAA >60  ANIONGAP 10    Recent Labs  Lab 02/25/24 1714  PROT 7.3  ALBUMIN 4.5  AST 17  ALT 15  ALKPHOS 71  BILITOT 0.7   Lipids No results for input(s): CHOL, TRIG, HDL, LABVLDL, LDLCALC, CHOLHDL in the last 168 hours.  Hematology Recent Labs  Lab 02/25/24 1714  WBC 8.9  RBC 5.07  HGB 15.2  HCT 44.3  MCV 87.4  MCH 30.0  MCHC 34.3  RDW 12.5  PLT 316   Thyroid  No results for input(s): TSH, FREET4 in the last 168  hours.  BNPNo results for input(s): BNP, PROBNP in the last 168 hours.  DDimer  Recent Labs  Lab 02/25/24 1714  DDIMER  <0.27    Radiology/Studies:  CT HEAD WO CONTRAST Result Date: 02/25/2024 CLINICAL DATA:  Syncope/presyncope, cerebrovascular cause suspected EXAM: CT HEAD WITHOUT CONTRAST TECHNIQUE: Contiguous axial images were obtained from the base of the skull through the vertex without intravenous contrast. RADIATION DOSE REDUCTION: This exam was performed according to the departmental dose-optimization program which includes automated exposure control, adjustment of the mA and/or kV according to patient size and/or use of iterative reconstruction technique. COMPARISON:  12/20/2023 FINDINGS: Brain: No acute intracranial abnormality. Specifically, no hemorrhage, hydrocephalus, mass lesion, acute infarction, or significant intracranial injury. Vascular: No hyperdense vessel or unexpected calcification. Skull: No acute calvarial abnormality. Sinuses/Orbits: No acute findings Other: None IMPRESSION: No acute intracranial abnormality. Electronically Signed   By: Franky Crease M.D.   On: 02/25/2024 18:00   DG Chest Port 1 View Result Date: 02/25/2024 CLINICAL DATA:  Chest pain. EXAM: PORTABLE CHEST 1 VIEW COMPARISON:  October 10, 2023. FINDINGS: The heart size and mediastinal contours are within normal limits. Both lungs are clear. The visualized skeletal structures are unremarkable. IMPRESSION: No active disease. Electronically Signed   By: Lynwood Landy Raddle M.D.   On: 02/25/2024 17:28     Assessment and Plan: Syncope- unclear etiology but suspect vasovagal.  Longstanding history of syncope, dizziness -unclear etiology ?  Pauses, likely has PAC and PVC with post PVC pause I do not see any major pauses, recent telemetry did not comment anything H/o small fiber neuropathy H/o POTS H/o testicular cancer  Plan: -continue telemetry,  - continue hydration, compression stockings, and may need to rethink about fludrocortisone  and other therapy. Patient with difficult symptoms and multitude of symptoms need comprehensive  and multidisciplinary approach. -> I will keep him n.p.o. after midnight, probably EP can see him and consider loop recorder  Will follow him in am.   Risk Assessment/Risk Scores:              For questions or updates, please contact  [facility] Please consult [email or web address] for contact info under    Signed, Grayce Bold, MD  02/25/2024 10:16 PM

## 2024-02-25 NOTE — ED Provider Notes (Signed)
 [place] EMERGENCY DEPARTMENT AT [facility] Provider Note   CSN: [id number] Arrival date & time: 02/25/24  1655     Patient presents with: Loss of Consciousness  HPI AYYAN SITES III is a 43 y.o. male with history of small fiber neuropathy, history of testicular cancer, POTS, seizures presenting for syncope.  States he has had multiple syncopal episodes.  States he was recently diagnosed with autonomic small fiber neuropathy which he states causes his heart to stop.  Witnessed to have syncopized multiple times in the waiting room.  Intermittently unconscious in triage but had a pulse throughout.  Also mention that he has pain in the center of his chest with intermittent shortness of breath that started earlier today.  Also reporting that both his left and right arms are numb and his feet are numb as well.  Denies head injury or fall.     Loss of Consciousness      Prior to Admission medications   Medication Sig Start Date End Date Taking? Authorizing Provider  ALPRAZolam (XANAX) 0.25 MG tablet Take 0.25 mg by mouth 3 (three) times daily as needed for anxiety. 01/27/24  Yes [provider]  budesonide (ENTOCORT EC) 3 MG 24 hr capsule Take 9 mg by mouth daily. 02/17/24 03/18/24 Yes [provider]  famotidine  (PEPCID ) 20 MG tablet Take 20 mg by mouth daily. 01/15/24  Yes [provider]  gabapentin (NEURONTIN) 300 MG capsule Take 300 mg by mouth 3 (three) times daily.    [provider]    Allergies: Codeine, Contrast media [iodinated contrast media], Erythromycin, Iodine, Shellfish allergy, Azithromycin, Doxycycline , Corn-containing products, Dilaudid [hydromorphone hcl], Dilaudid [hydromorphone], Etodolac, Ioversol, Nsaids, Tetracyclines & related, and Prednisone     Review of Systems  Cardiovascular:  Positive for syncope.    Updated Vital Signs BP 107/68   Pulse 68   Temp 98.7 F (37.1 C)   Resp 18   SpO2 100%    Physical  Exam   Vitals:   02/25/24 1900 02/25/24 1915  BP: 113/80 107/68  Pulse: 68 68  Resp: 17 18  Temp:    SpO2: 100% 100%    CONSTITUTIONAL:  well-appearing, NAD NEURO:  Alert and oriented x 3, CN 3-12 grossly intact EYES:  eyes equal and reactive ENT/NECK:  Supple, no stridor CARDIO:  regular rate and rhythm, appears well-perfused  PULM:  No respiratory distress, CTAB GI/GU:  non-distended, soft MSK/SPINE:  No gross deformities, no edema, moves all extremities  SKIN:  no rash, atraumatic  *Additional and/or pertinent findings included in MDM below  (all labs ordered are listed, but only abnormal results are displayed) Labs Reviewed  CBC WITH DIFFERENTIAL/PLATELET  COMPREHENSIVE METABOLIC PANEL WITH GFR  D-DIMER, QUANTITATIVE  CBG MONITORING, ED  TROPONIN T, HIGH SENSITIVITY  TROPONIN T, HIGH SENSITIVITY    EKG: EKG Interpretation Date/Time:  Wednesday February 25 2024 17:07:14 EDT Ventricular Rate:  80 PR Interval:  132 QRS Duration:  92 QT Interval:  364 QTC Calculation: 420 R Axis:   88  Text Interpretation: Sinus rhythm Confirmed by Ruthe Cornet [phone]) on 02/25/2024 5:22:10 PM  Radiology: CT HEAD WO CONTRAST Result Date: 02/25/2024 CLINICAL DATA:  Syncope/presyncope, cerebrovascular cause suspected EXAM: CT HEAD WITHOUT CONTRAST TECHNIQUE: Contiguous axial images were obtained from the base of the skull through the vertex without intravenous contrast. RADIATION DOSE REDUCTION: This exam was performed according to the departmental dose-optimization program which includes automated exposure control, adjustment of the mA and/or kV according to patient  size and/or use of iterative reconstruction technique. COMPARISON:  12/20/2023 FINDINGS: Brain: No acute intracranial abnormality. Specifically, no hemorrhage, hydrocephalus, mass lesion, acute infarction, or significant intracranial injury. Vascular: No hyperdense vessel or unexpected calcification. Skull: No acute calvarial  abnormality. Sinuses/Orbits: No acute findings Other: None IMPRESSION: No acute intracranial abnormality. Electronically Signed   By: Franky Crease M.D.   On: 02/25/2024 18:00   DG Chest Port 1 View Result Date: 02/25/2024 CLINICAL DATA:  Chest pain. EXAM: PORTABLE CHEST 1 VIEW COMPARISON:  October 10, 2023. FINDINGS: The heart size and mediastinal contours are within normal limits. Both lungs are clear. The visualized skeletal structures are unremarkable. IMPRESSION: No active disease. Electronically Signed   By: Lynwood Landy Raddle M.D.   On: 02/25/2024 17:28     Procedures   Medications Ordered in the ED  sodium chloride  0.9 % bolus 1,000 mL (0 mLs Intravenous Stopped 02/25/24 1856)    Clinical Course as of 02/25/24 1939  Wed Feb 25, 2024  1903 Dr. Alvan of cardiology recommended admission to medicine and cards will follow him. [JR]    Clinical Course User Index [JR] Lang Norleen POUR, PA-C                                 Medical Decision Making Amount and/or Complexity of Data Reviewed Labs: ordered. Radiology: ordered.   Initial Impression and Ddx 43 year old well-appearing male presenting for syncope.  Initially evaluated patient in triage.  Was called to the bedside after he had syncopized and was unconscious reportedly by nursing staff in the waiting room.  When I saw him he was notably pale, diaphoretic but he had a 2+ radial pulse and was AAO x 3.  Was endorsing chest pain at that time as well.  DDx includes ACS, arrhythmia, PE, aortic dissection, stroke, seizure, other. Patient PMH that increases complexity of ED encounter:  history of small fiber neuropathy, history of testicular cancer, POTS, seizures   Interpretation of Diagnostics - I independent reviewed and interpreted the labs as followed: No acute derangements.  - I independently visualized the following imaging with scope of interpretation limited to determining acute life threatening conditions related to emergency  care: CT head and chest x-ray, which revealed no acute abnormalities  -I personally reviewed interpreted EKG which revealed sinus rhythm  -I personally reviewed and interpreted his telemetry as well along with Dr. Ruthe which revealed multiple missed beats without evidence of obvious heart block  Patient Reassessment and Ultimate Disposition/Management On reassessment, patient remained well-appearing, did notice at the bedside that on telemetry there was missed beats and at that time he would tell me he was feeling a sense of palpitations in his chest.  Discussed patient with Dr. Alvan of cardiology who recommended admission to medicine for further evaluation.  Admitted with Dr. Shona.  Plan is for transfer to [facility].  Patient management required discussion with the following services or consulting groups:  Hospitalist Service and Cardiology  Complexity of Problems Addressed Acute complicated illness or Injury  Additional Data Reviewed and Analyzed Further history obtained from: Further history from spouse/family member, Past medical history and medications listed in the EMR, and Prior ED visit notes  Patient Encounter Risk Assessment Consideration of hospitalization      Final diagnoses:  Syncope, unspecified syncope type    ED Discharge Orders     None          Lang,  Norleen POUR, PA-C 02/25/24 1939    Ruthe Cornet, DO 02/25/24 2002

## 2024-02-25 NOTE — ED Notes (Signed)
 Pt reports taking 5000 mcgs of Biotin per day. Pt concerned this may falsely alter Troponin results to read lower than normal. EDP notified and aware. Pt also wants to also note that he takes high dose gamma-E every day.

## 2024-02-25 NOTE — ED Notes (Signed)
 Attempted to call report, Diplomatic Services operational officer reported Engineer, civil (consulting) was in meeting and has not reviewed chart, reports they will call back for report.

## 2024-02-25 NOTE — H&P (Signed)
 History and Physical    Patient: Manuel [name] FMW:[id number] DOB: [birth year] DOA: 02/25/2024 DOS: the patient was seen and examined on 02/25/2024 PCP: Beam, Lamar POUR, MD  Patient coming from: {Point_of_Origin:[address]777}  Chief Complaint:  Chief Complaint  Patient presents with   Loss of Consciousness   HPI: Manuel [name] is a 43 y.o. male with medical history significant of ***  Review of Systems: {ROS_Text:26778} 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   Viral meningitis 04/08/2014   Vision changes 10/05/2015   Past Surgical History:  Procedure Laterality Date   BIOPSY  02/11/2023   Procedure: BIOPSY;  Surgeon: Rosalie Kitchens, MD;  Location: [facility];  Service: Gastroenterology;;   ESOPHAGOGASTRODUODENOSCOPY N/A 02/11/2023   Procedure: ESOPHAGOGASTRODUODENOSCOPY (EGD);  Surgeon: Rosalie Kitchens, MD;  Location: [facility];  Service: Gastroenterology;  Laterality: N/A;   ORCHIECTOMY     ROTATOR CUFF REPAIR     SURGERY SCROTAL / TESTICULAR  2006   Social History:  reports that he has never smoked. He has never used smokeless tobacco. He reports that he does not currently use alcohol. He reports that he does not use drugs.  Allergies  Allergen Reactions   Codeine Nausea Only and Anaphylaxis    GI upset   Contrast Media [Iodinated Contrast Media] Anaphylaxis   Erythromycin Hives, Itching and Anaphylaxis   Iodine Anaphylaxis, Hives, Other (See Comments) and Shortness Of Breath    Other reaction(s): anaphylaxis    Shellfish Allergy Anaphylaxis   Azithromycin Dermatitis and Other (See Comments)   Doxycycline  Nausea And Vomiting   Corn-Containing Products Nausea And Vomiting   Dilaudid [Hydromorphone Hcl] Other (See Comments)    Nausea   Dilaudid [Hydromorphone]     Other reaction(s): urinary retention   Etodolac Other (See Comments)   Ioversol    Nsaids Other (See Comments)    Other reaction(s): Palpitations (01/2015)   Tetracyclines & Related    Prednisone  Anxiety    Stomach upset    Family History  Problem Relation Age of Onset   Migraines Mother    Diabetes type II Father     Prior to Admission medications   Medication Sig Start Date End Date Taking? Authorizing Provider  ALPRAZolam (XANAX) 0.25 MG tablet Take 0.25 mg by mouth 3 (three) times daily as needed for anxiety. 01/27/24  Yes [provider]  budesonide (ENTOCORT EC) 3 MG 24 hr capsule Take 9 mg by mouth daily. 02/17/24 03/18/24 Yes [provider]  famotidine  (PEPCID ) 20 MG tablet Take 20 mg by mouth daily. 01/15/24  Yes [provider]  gabapentin (NEURONTIN) 300 MG capsule Take 300 mg by mouth 3 (three) times daily.    [provider]    Physical Exam: Vitals:   02/25/24 2000 02/25/24 2015 02/25/24 2122 02/25/24 2127  BP: 117/80 121/89  113/79  Pulse: 70 77 77   Resp: 13 14 18    Temp:   98.4 F (36.9 C)   TempSrc:  Oral   SpO2: 100% 100% 98%    *** Data Reviewed: {Tip this will not be part of the note when signed- Document your independent interpretation of telemetry tracing, EKG, lab, Radiology test or any other diagnostic tests. Add any new diagnostic test ordered today. (Optional):26781} {Results:26384}  Assessment and Plan: No notes have been filed under this hospital service. Service: Hospitalist     Advance Care Planning:   Code Status: Full Code ***  Consults: ***  Family Communication: ***  Severity of  Illness: {Observation/Inpatient:21159}  AuthorBETHA SIM KNOLL, MD 02/25/2024 10:22 PM  For on call review [email or web address].

## 2024-02-25 NOTE — ED Notes (Signed)
 Pt CBG 102. CBG over ride in triage due to lack of pt labels. EDP made aware

## 2024-02-25 NOTE — ED Triage Notes (Signed)
 Syncopal episode today. Recent dx autonomic small fibro neurotrophy. States heart stopped pta   Patient alert and talking to staff. Diaphoretic  and weak.

## 2024-02-26 ENCOUNTER — Encounter (HOSPITAL_COMMUNITY)
Admission: EM | Disposition: A | Discharge status: Home / Self Care | Payer: Self-pay | Source: Home / Self Care | Attending: Emergency Medicine

## 2024-02-26 ENCOUNTER — Encounter (HOSPITAL_COMMUNITY): Payer: Self-pay | Admitting: Student

## 2024-02-26 DIAGNOSIS — G43709 Chronic migraine without aura, not intractable, without status migrainosus: Secondary | ICD-10-CM | POA: Diagnosis not present

## 2024-02-26 DIAGNOSIS — C6291 Malignant neoplasm of right testis, unspecified whether descended or undescended: Secondary | ICD-10-CM | POA: Diagnosis not present

## 2024-02-26 DIAGNOSIS — R55 Syncope and collapse: Secondary | ICD-10-CM

## 2024-02-26 LAB — CBG MONITORING, ED: Glucose-Capillary: 102 mg/dL — ABNORMAL HIGH (ref 70–99)

## 2024-02-26 LAB — T4, FREE: Free T4: 1 ng/dL (ref 0.61–1.12)

## 2024-02-26 LAB — COMPREHENSIVE METABOLIC PANEL WITH GFR
ALT: 16 U/L (ref 0–44)
AST: 14 U/L — ABNORMAL LOW (ref 15–41)
Albumin: 3.5 g/dL (ref 3.5–5.0)
Alkaline Phosphatase: 46 U/L (ref 38–126)
Anion gap: 8 (ref 5–15)
BUN: 17 mg/dL (ref 6–20)
CO2: 22 mmol/L (ref 22–32)
Calcium: 9 mg/dL (ref 8.9–10.3)
Chloride: 107 mmol/L (ref 98–111)
Creatinine, Ser: 0.93 mg/dL (ref 0.61–1.24)
GFR, Estimated: >60 mL/min (ref >60)
Glucose, Bld: 94 mg/dL (ref 70–99)
Potassium: 4.1 mmol/L (ref 3.5–5.1)
Sodium: 137 mmol/L (ref 135–145)
Total Bilirubin: 1.1 mg/dL (ref 0.0–1.2)
Total Protein: 6.1 g/dL — ABNORMAL LOW (ref 6.5–8.1)

## 2024-02-26 LAB — CBC
HCT: 40 % (ref 39.0–52.0)
Hemoglobin: 13.3 g/dL (ref 13.0–17.0)
MCH: 29.2 pg (ref 26.0–34.0)
MCHC: 33.3 g/dL (ref 30.0–36.0)
MCV: 87.7 fL (ref 80.0–100.0)
Platelets: 259 10*3/uL (ref 150–400)
RBC: 4.56 MIL/uL (ref 4.22–5.81)
RDW: 12.5 % (ref 11.5–15.5)
WBC: 8.3 10*3/uL (ref 4.0–10.5)
nRBC: 0 % (ref 0.0–0.2)

## 2024-02-26 LAB — GLUCOSE, CAPILLARY: Glucose-Capillary: 98 mg/dL (ref 70–99)

## 2024-02-26 LAB — TSH: TSH: 1.572 u[IU]/mL (ref 0.350–4.500)

## 2024-02-26 SURGERY — LOOP RECORDER INSERTION

## 2024-02-26 NOTE — Progress Notes (Signed)
 PROGRESS NOTE  Manuel [name] FMW:[id number] DOB: 1981/01/23   PCP: [name], MD  Patient is from: Home  DOA: 02/25/2024 LOS: 1  Chief complaints Chief Complaint  Patient presents with   Loss of Consciousness     Brief Narrative / Interim history: 43 year old with PMH of POTS, collagenous gastritis, migraine headaches, seizure disorder, testicular cancer in remission, viral meningitis, small fiber neuropathy and micronutrient deficiency presented to [facility] ED with recurrent syncopal and presyncope, long pauses, fatigue and lethargy, and admitted with working diagnosis of possible syncope.  Per EDP, witnessed to have syncopized multiple times in the waiting room and intermittently unconscious.  Patient is recently diagnosed with small fiber neuropathy by neurology.  Also micronutrient deficiency and has been on multiple vitamin supplementations.  Followed by EP, Dr. Fernande.  He had Holter monitor for 2 weeks in March.  In ED, vital stable.  In normal sinus rhythm.  CMP, CBC and serial troponin negative.  Cardiology consulted and seen patient on admission.  Subjective: Seen and examined earlier this morning.  No major events overnight or this morning.  Continues to report intermittent pauses.  Also reports left facial numbness, lethargy, fatigue and intermittent chest pain.  Patient's father at bedside.  Objective: Vitals:   02/25/24 2326 02/26/24 0500 02/26/24 0608 02/26/24 0742  BP: 97/69  103/63 118/63  Pulse: 67  70 67  Resp: 17  15 12   Temp: 97.8 F (36.6 C)  97.8 F (36.6 C) (!) 97.5 F (36.4 C)  TempSrc: Oral  Oral Oral  SpO2: 97%  99% 98%  Weight:  64.7 kg      Examination:  GENERAL: No apparent distress.  Nontoxic. HEENT: MMM.  Vision and hearing grossly intact.  NECK: Supple.  No apparent JVD.  RESP:  No IWOB.  Fair aeration bilaterally. CVS:  RRR. Heart sounds normal.  ABD/GI/GU: BS+. Abd soft, NTND.  MSK/EXT:  Moves extremities. No apparent  deformity. No edema.  SKIN: no apparent skin lesion or wound NEURO: AA.  Oriented appropriately.  Clear speech.  No facial asymmetry.  PERRL.  Motor strength symmetric in all extremities.  Diminished light sensation in left lower extremity.  PSYCH: Calm. Normal affect.   Consultants:  Cardiology  Procedures: None  Microbiology summarized: None  Assessment and plan: Recurrent syncope/presyncope: Unclear etiology of this but patient reports history of autonomic dysfunction attributed to small fiber neuropathy per his neurologist outpatient.  Twelve-lead EKG NSR.  Serial troponin and basic labs unrevealing.  TSH normal.  Had a normal TTE in 10/2023.  Based on cardiology note from 11/24/2023, concerned about Roemheld syndrome as a result of interaction between the GI tract and the heart by the way of autonomic given postprandial hypotension and palpitation.  He also had previous chest, abdomen and peritoneal radiation.  -Follow recommendation by cardiology.  Paresthesia: Reports numbness in his left face.  Also diminished light sensation in his left leg.  Unclear if this is related to his small fiber neuropathy.  It seems like he has left-sided numbness and paresthesia in the past.  His MRI in March was negative.  He stated that his neurologist was planning to get cervical imaging -Follow repeat MRI brain -Added MRI cervical spine   Questionable pauses: Not seen on EKG or telemetry.  Patient also had outpatient Holter monitor not sure if it picked of those pauses and PVCs.  Defer to cardiology.    History of small fiber neuropathy: Defer to outpatient therapy   History  of POTS: Again continue per cardiology.   History of testicular cancer in remission.  Per last oncology note in 05/2023, repeat imaging study showed no evidence of disease and he does not need long-term follow-up.  Body mass index is 20.47 kg/m.           DVT prophylaxis:  enoxaparin (LOVENOX) injection 40 mg Start:  02/26/24 1000  Code Status: Full code Family Communication: Updated patient's father at bedside Level of care: Progressive Status is: Inpatient Remains inpatient appropriate because: Syncope   Final disposition: Home   55 minutes with more than 50% spent in reviewing records, counseling patient/family and coordinating care.   Sch Meds:  Scheduled Meds:  budesonide  9 mg Oral Daily   enoxaparin (LOVENOX) injection  40 mg Subcutaneous Q24H   famotidine   20 mg Oral Daily   gabapentin  300 mg Oral TID   sodium chloride  flush  3 mL Intravenous Q12H   Continuous Infusions:  lactated ringers      PRN Meds:.ALPRAZolam, ondansetron  **OR** ondansetron  (ZOFRAN ) IV  Antimicrobials: Anti-infectives (From admission, onward)    None        I have personally reviewed the following labs and images: CBC: Recent Labs  Lab 02/25/24 1714 02/25/24 2251 02/26/24 0550  WBC 8.9 9.2 8.3  NEUTROABS 5.2  --   --   HGB 15.2 13.7 13.3  HCT 44.3 40.5 40.0  MCV 87.4 87.3 87.7  PLT 316 302 259   BMP &GFR Recent Labs  Lab 02/25/24 1714 02/25/24 2251 02/26/24 0550  NA 139  --  137  K 4.0  --  4.1  CL 101  --  107  CO2 27  --  22  GLUCOSE 94  --  94  BUN 19  --  17  CREATININE 1.07 1.01 0.93  CALCIUM 9.8  --  9.0   Estimated Creatinine Clearance: 92.8 mL/min (by C-G formula based on SCr of 0.93 mg/dL). Liver & Pancreas: Recent Labs  Lab 02/25/24 1714 02/26/24 0550  AST 17 14*  ALT 15 16  ALKPHOS 71 46  BILITOT 0.7 1.1  PROT 7.3 6.1*  ALBUMIN 4.5 3.5   No results for input(s): LIPASE, AMYLASE in the last 168 hours. No results for input(s): AMMONIA in the last 168 hours. Diabetic: No results for input(s): HGBA1C in the last 72 hours. Recent Labs  Lab 02/25/24 1658 02/26/24 0532  GLUCAP 102* 98   Cardiac Enzymes: No results for input(s): CKTOTAL, CKMB, CKMBINDEX, TROPONINI in the last 168 hours. No results for input(s): PROBNP in the last 8760  hours. Coagulation Profile: No results for input(s): INR, PROTIME in the last 168 hours. Thyroid  Function Tests: Recent Labs    02/26/24 0550  TSH 1.572  FREET4 1.00   Lipid Profile: No results for input(s): CHOL, HDL, LDLCALC, TRIG, CHOLHDL, LDLDIRECT in the last 72 hours. Anemia Panel: No results for input(s): VITAMINB12, FOLATE, FERRITIN, TIBC, IRON, RETICCTPCT in the last 72 hours. Urine analysis:    Component Value Date/Time   COLORURINE COLORLESS (A) 06/23/2023 1619   APPEARANCEUR CLEAR 06/23/2023 1619   LABSPEC 1.003 (L) 06/23/2023 1619   PHURINE 6.0 06/23/2023 1619   GLUCOSEU NEGATIVE 06/23/2023 1619   HGBUR NEGATIVE 06/23/2023 1619   BILIRUBINUR NEGATIVE 06/23/2023 1619   KETONESUR NEGATIVE 06/23/2023 1619   PROTEINUR NEGATIVE 06/23/2023 1619   UROBILINOGEN 0.2 08/21/2014 1052   NITRITE NEGATIVE 06/23/2023 1619   LEUKOCYTESUR NEGATIVE 06/23/2023 1619   Sepsis Labs: Invalid input(s): PROCALCITONIN, LACTICIDVEN  Microbiology:  No results found for this or any previous visit (from the past 240 hours).  Radiology Studies: CT HEAD WO CONTRAST Result Date: 02/25/2024 CLINICAL DATA:  Syncope/presyncope, cerebrovascular cause suspected EXAM: CT HEAD WITHOUT CONTRAST TECHNIQUE: Contiguous axial images were obtained from the base of the skull through the vertex without intravenous contrast. RADIATION DOSE REDUCTION: This exam was performed according to the departmental dose-optimization program which includes automated exposure control, adjustment of the mA and/or kV according to patient size and/or use of iterative reconstruction technique. COMPARISON:  12/20/2023 FINDINGS: Brain: No acute intracranial abnormality. Specifically, no hemorrhage, hydrocephalus, mass lesion, acute infarction, or significant intracranial injury. Vascular: No hyperdense vessel or unexpected calcification. Skull: No acute calvarial abnormality. Sinuses/Orbits: No acute  findings Other: None IMPRESSION: No acute intracranial abnormality. Electronically Signed   By: Franky Crease M.D.   On: 02/25/2024 18:00   DG Chest Port 1 View Result Date: 02/25/2024 CLINICAL DATA:  Chest pain. EXAM: PORTABLE CHEST 1 VIEW COMPARISON:  October 10, 2023. FINDINGS: The heart size and mediastinal contours are within normal limits. Both lungs are clear. The visualized skeletal structures are unremarkable. IMPRESSION: No active disease. Electronically Signed   By: Lynwood Landy Raddle M.D.   On: 02/25/2024 17:28      Jontae Adebayo T. Nhu Glasby [facility]  If 7PM-7AM, please contact night-coverage [email or web address] 02/26/2024, 10:36 AM

## 2024-02-26 NOTE — Progress Notes (Addendum)
 Progress Note  Patient Name: Manuel [name] Date of Encounter: 02/26/2024 [facility] Cardiologist: Lynwood Schilling, MD   Interval Summary   Patient admitted overnight for history of recurrent episodes of syncope.  No syncopal events overnight.  We discussed at great lengths these episodes and previous specialist workup for it.  Vital Signs Vitals:   02/25/24 2326 02/26/24 0500 02/26/24 0608 02/26/24 0742  BP: 97/69  103/63 118/63  Pulse: 67  70 67  Resp: 17  15 12   Temp: 97.8 F (36.6 C)  97.8 F (36.6 C) (!) 97.5 F (36.4 C)  TempSrc: Oral  Oral Oral  SpO2: 97%  99% 98%  Weight:  64.7 kg      Intake/Output Summary (Last 24 hours) at 02/26/2024 1047 Last data filed at 02/26/2024 0608 Gross per 24 hour  Intake 1000 ml  Output 200 ml  Net 800 ml      02/26/2024    5:00 AM 12/23/2023   11:23 AM 12/20/2023    2:17 PM  Last 3 Weights  Weight (lbs) 142 lb 10.2 oz 142 lb 145 lb  Weight (kg) 64.7 kg 64.411 kg 65.772 kg      Telemetry/ECG  Sinus rhythm heart rates in the 80s.  Frequent blocked PACs.- Personally Reviewed  Physical Exam  GEN: No acute distress.   Neck: No JVD Cardiac: RRR, no murmurs, rubs, or gallops.  Respiratory: Clear to auscultation bilaterally. GI: Soft, nontender, non-distended  MS: No edema  Patient Profile Patient with past medical history significant for testicular cancer status post radiation, small fiber neuropathy, seizure disorder, migraines, viral meningitis,  Assessment & Plan   Syncope Patient is followed by Dr. Fernande.  In brief patient reports that he has history of testicular cancer status post radiation that he reports subsequently led to collagenous gastritis with severe micronutrient deficiencies, also recently diagnosed with small fiber neuropathy recently.  Has underlying autonomic dysfunction issues that he feels is related to this (previously thought to be POTS though).  He is followed by multiple specialists  (neurology, GI)  He reports for the last 2 to 3 days he has had multiple episodes of syncope with loss of consciousness.  States that his heart stops frequently and for 5 to 10 seconds at a time.  He also reports he has episodes where he is physically unable to swallow and not able to move his arms.  Reportedly had episode at [facility] ED but no documented telemetry strips or EKGs have verified this.  But has not had any episodes while at [facility].  Cardiac related causes for this have been unremarkable.  He has had recent heart monitor in May that showed predominant sinus rhythm without any significant arrhythmias or ectopy burden.  0 CAC score.  Echocardiogram February 2025 without any significant abnormalities/valvular disease.  In review of telemetry it appears that he is having blocked PACs although pauses only for 1.5 seconds that seem disproportionate to his symptoms.  It is possible that he could have some sinus node dysfunction that was not detected on previous heart monitor given abrupt symptoms for last 3 days although this is not obvious on current telemetry/EKG.  There has been no evidence of high degree block, profound bradycardia or pauses here. Will plan for loop recorder, EP to consult.  If indication for PPM, I do not think this will completely solve some of his other issues but need to verify if he is truly having prolonged pauses. I think he should  follow-up with Dr. Fernande soon as possible there was some consideration of stimulation devices and had previous order for cardiac MRI. Otherwise continue to aggressively treat secondary causes and avoid triggers. May benefit from GI/neurology input here.  He has pending cervical MRI here.       For questions or updates, please contact [facility] Please consult [email or web address] for contact info under       Signed, Thom LITTIE Sluder, PA-C

## 2024-02-26 NOTE — Plan of Care (Signed)
  Problem: Education: Goal: Knowledge of General Education information will improve Description: Including pain rating scale, medication(s)/side effects and non-pharmacologic comfort measures 02/26/2024 1823 by Christianne Fredie CROME, RN Outcome: Completed/Met 02/26/2024 1419 by Christianne Fredie CROME, RN Outcome: Progressing   Problem: Health Behavior/Discharge Planning: Goal: Ability to manage health-related needs will improve 02/26/2024 1823 by Christianne Fredie CROME, RN Outcome: Completed/Met 02/26/2024 1419 by Christianne Fredie CROME, RN Outcome: Progressing   Problem: Clinical Measurements: Goal: Ability to maintain clinical measurements within normal limits will improve 02/26/2024 1823 by Christianne Fredie CROME, RN Outcome: Completed/Met 02/26/2024 1419 by Christianne Fredie CROME, RN Outcome: Progressing Goal: Will remain free from infection 02/26/2024 1823 by Christianne Fredie CROME, RN Outcome: Completed/Met 02/26/2024 1419 by Christianne Fredie CROME, RN Outcome: Progressing Goal: Diagnostic test results will improve 02/26/2024 1823 by Christianne Fredie CROME, RN Outcome: Completed/Met 02/26/2024 1419 by Christianne Fredie CROME, RN Outcome: Progressing Goal: Respiratory complications will improve 02/26/2024 1823 by Christianne Fredie CROME, RN Outcome: Completed/Met 02/26/2024 1419 by Christianne Fredie CROME, RN Outcome: Progressing Goal: Cardiovascular complication will be avoided 02/26/2024 1823 by Christianne Fredie CROME, RN Outcome: Completed/Met 02/26/2024 1419 by Christianne Fredie CROME, RN Outcome: Progressing   Problem: Activity: Goal: Risk for activity intolerance will decrease 02/26/2024 1823 by Christianne Fredie CROME, RN Outcome: Completed/Met 02/26/2024 1419 by Christianne Fredie CROME, RN Outcome: Progressing   Problem: Nutrition: Goal: Adequate nutrition will be maintained 02/26/2024 1823 by Christianne Fredie CROME, RN Outcome: Completed/Met 02/26/2024 1419 by Christianne Fredie CROME, RN Outcome: Progressing   Problem: Coping: Goal: Level of anxiety will decrease 02/26/2024 1823 by Christianne Fredie CROME, RN Outcome:  Completed/Met 02/26/2024 1419 by Christianne Fredie CROME, RN Outcome: Progressing   Problem: Elimination: Goal: Will not experience complications related to bowel motility 02/26/2024 1823 by Christianne Fredie CROME, RN Outcome: Completed/Met 02/26/2024 1419 by Christianne Fredie CROME, RN Outcome: Progressing Goal: Will not experience complications related to urinary retention 02/26/2024 1823 by Christianne Fredie CROME, RN Outcome: Completed/Met 02/26/2024 1419 by Christianne Fredie CROME, RN Outcome: Progressing   Problem: Pain Managment: Goal: General experience of comfort will improve and/or be controlled 02/26/2024 1823 by Christianne Fredie CROME, RN Outcome: Completed/Met 02/26/2024 1419 by Christianne Fredie CROME, RN Outcome: Progressing   Problem: Safety: Goal: Ability to remain free from injury will improve 02/26/2024 1823 by Christianne Fredie CROME, RN Outcome: Completed/Met 02/26/2024 1419 by Christianne Fredie CROME, RN Outcome: Progressing   Problem: Skin Integrity: Goal: Risk for impaired skin integrity will decrease 02/26/2024 1823 by Christianne Fredie CROME, RN Outcome: Completed/Met 02/26/2024 1419 by Christianne Fredie CROME, RN Outcome: Progressing   Problem: Education: Goal: Knowledge of condition and prescribed therapy will improve 02/26/2024 1823 by Christianne Fredie CROME, RN Outcome: Completed/Met 02/26/2024 1419 by Christianne Fredie CROME, RN Outcome: Progressing   Problem: Cardiac: Goal: Will achieve and/or maintain adequate cardiac output 02/26/2024 1823 by Christianne Fredie CROME, RN Outcome: Completed/Met 02/26/2024 1419 by Christianne Fredie CROME, RN Outcome: Progressing   Problem: Physical Regulation: Goal: Complications related to the disease process, condition or treatment will be avoided or minimized 02/26/2024 1823 by Christianne Fredie CROME, RN Outcome: Completed/Met 02/26/2024 1419 by Christianne Fredie CROME, RN Outcome: Progressing

## 2024-02-26 NOTE — Consult Note (Addendum)
 ELECTROPHYSIOLOGY CONSULT NOTE  Patient ID: Manuel [name] MRN: [id number], DOB/AGE: 02-05-1981   Admit date: 02/25/2024 Date of Consult: 02/26/2024  Primary Physician: [name], MD Primary Cardiologist: Lynwood Schilling, MD  Primary Electrophysiologist: Elspeth Sage, MD  Reason for Consultation: Syncope  Insurance: BCBS  History of Present Illness Manuel [name] is a 43 y.o. male with h/o small fiber neuropathy (biopsy proven), history of testicular cancer, POTS, and seizures for placement of an implantable loop recorder to monitor for syncope.  The patient was admitted on 02/25/2024 with Syncope.    He has undergone prior workup for arrhythmia/cardiac cause including:   Echo 10/2023 LVEF 60-65% Coronary CT 11/01/2023 Coronary Calcium Score 0 ABIs 01/28/2024 - Normal Monitor 5/25 - 02/08/2024 HRs 47 - 135 bpm and avg 68 bpm; No significant ectopy or arrhythmia   Has previously felt worse on BB.   Pt recently had biopsy proven diagnosis of Small fiber neuropathy. Pt has also been noted to have post prandial hypotension.   Well known to EP team with history as above.   Pt presented to [facility] very near syncope, and then had multiple syncope while in the ED. He also reports while he was in the ER at [facility] he had a lot of pauses he could sleep his heart stopping and then restarting but in between he was feeling very fatigued and lethargic. There is nothing telemetry found. EKG and strip shows PACs.   Telemetry here thus far has been unremarkable, though pt very concerned regarding occasional PACs with brief compensatory causes.   Allergies, Past Medical, Surgical, Social, and Family Histories have been reviewed and are referenced here-in when relevant for medical decision making.   Inpatient Medications:   budesonide  9 mg Oral Daily   enoxaparin (LOVENOX) injection  40 mg Subcutaneous Q24H   famotidine   20 mg Oral Daily   sodium chloride  flush  3 mL  Intravenous Q12H    Physical Exam: Vitals:   02/26/24 0608 02/26/24 0742 02/26/24 1200 02/26/24 1310  BP: 103/63 118/63  118/65  Pulse: 70 67 80 72  Resp: 15 12 17 15   Temp: 97.8 F (36.6 C) (!) 97.5 F (36.4 C)  98.1 F (36.7 C)  TempSrc: Oral Oral  Oral  SpO2: 99% 98% 97% 99%  Weight:        GEN- NAD. A&O x 3. Normal affect. HEENT: Normocephalic, atraumatic Lungs- CTAB, Normal effort.  Heart- Regular rate and rhythm with occasional ectopy rate and rhythm. No M/G/R.  Extremities- No peripheral edema. no clubbing or cyanosis Skin- warm and dry, no rash or lesion. Neuro - No focal deficits.    12-lead ECG NSR at 80 bpm (personally reviewed)  Telemetry NSR/ Sinus tach 70-110s with occasional PACs and compensatory brief pause as well as blocked PACs (personally reviewed)  Assessment and Plan:  Syncope / pre-syncope Autonomic dysfunction PACs Overall normal cardiac work up as above.  Suspect multifactorial with h/o autonomic dysfunction likely exacerbated or caused by small fiber neuropathy.  Pauses on telemetry have not been significant  No indication for PPM at this juncture.  Felt worse on beta blockers. I spoke at length with the patient about monitoring for afib with an implantable loop recorder, including monthly monitor fees which may range from $0-$40.  Risks, benefits, and alteratives to implantable loop recorder were discussed with the patient today.   Small fiber neuropathy Manuel [name] need to look into further, but has an association with autonomic dysfunction  Pacing is not a primary therapy for either SFN or autonomic dysfunction  Manuel Andrew Tillery, PA-C 02/26/2024 3:36 PM  I have seen and examined this patient with Manuel [name].  Agree with above, note added to reflect my findings.  The past history above.  Presents to the hospital in after an episode of syncope.  He presented to [facility] with a near syncopal episode.  He was feeling quite poor initially  that day.  On the drive to [facility], he felt better.  While in the waiting room in [facility], he was sitting in the wheelchair and felt his arms and hands quite heavy, like concrete.  He also noticed that he had trouble swallowing.  He says he passed out at that time.  He was feeling very fatigued around the time of his episode of syncope.  While in the emergency room, he states that he saw multiple irregular rhythms on his cardiac monitor.  He has a rhythm strip that shows a single blocked PAC.  GEN: No acute distress.   Neck: No JVD Cardiac: Tachycardic Respiratory: Normal work of breathing MS: No edema; No deformity. Neuro:  Nonfocal  Skin: warm and dry Psych: Normal affect    Syncope/presyncope Autonomic dysfunction PACs Small fiber neuropathy  Patient has a very complex medical history including small fiber neuropathy.  He has significant autonomic dysfunction, thought caused by his neuropathy according to his neurologist.  He is having multiple PACs.  He has significant palpitations.  He also had an episode of syncope with multiple episodes of near syncope.  He has recently worn a 2-week ZIO monitor that showed PACs but no obvious arrhythmias otherwise.  He a less than 1% PAC burden.  He has felt different since his monitor was removed.  I have offered ILR implant.  He understands that if he has an episode of syncope, this could rule out cardiac causes.  For now, he would like to hold off.  Manuel [name] discuss further in clinic.  Manuel [name] M. Manuel Yannuzzi MD 02/26/2024 6:47 PM

## 2024-02-26 NOTE — Progress Notes (Signed)
 Transport came for patient to take him down to MRI. Patient requested nurse. Nurse explained to patient what MRI was ordered for. Patient stated that he recently had an MRI and would like to go off of that MRI that was already completed and that he would like to speak with the doctor concerning this. MD made aware. Fredie LITTIE Morones, RN

## 2024-02-26 NOTE — Plan of Care (Signed)
   Problem: Education: Goal: Knowledge of General Education information will improve Description Including pain rating scale, medication(s)/side effects and non-pharmacologic comfort measures Outcome: Progressing

## 2024-02-26 NOTE — Plan of Care (Signed)

## 2024-02-26 NOTE — Progress Notes (Signed)
 Patient decided on not getting loop recorder placed. Patient stated that he wanted to wait until he sees his neurologist and will decide after that. Patient requested to be discharged. MD made aware and placed discharge orders. Discharge instructions give to patient and wife at bedside. Techs x2 escorted patient off of unit and out of facility. Patient nor wife had any questions. Fredie LITTIE Morones, RN

## 2024-02-26 NOTE — Discharge Summary (Signed)
 Physician Discharge Summary  Manuel [name] FMW:[id number] DOB: 1980-12-01 DOA: 02/25/2024  PCP: [name], MD  Admit date: 02/25/2024 Discharge date: 02/26/24  Admitted From: Home Disposition: Home Recommendations for Outpatient Follow-up:  Follow up with PCP in 1 week Outpatient follow-up with electrophysiology Outpatient follow-up with neurology Check CMP and CBC at follow-up Please follow up on the following pending results: None  Home Health: No need identified Equipment/Devices: No need identified  Discharge Condition: Stable CODE STATUS: Full code  Follow-up Information     Beam, Lamar POUR, MD. Schedule an appointment as soon as possible for a visit in 1 week(s).   Specialty: Family Medicine Contact information: [address] [place] [address] [phone]                 Hospital course 43 year old with PMH of POTS, collagenous gastritis, migraine headaches, seizure disorder, testicular cancer in remission, viral meningitis, small fiber neuropathy and micronutrient deficiency presented to [facility] with recurrent syncopal and presyncope, long pauses, fatigue and lethargy, and admitted with working diagnosis of possible syncope.  Per EDP, witnessed to have syncopized multiple times in the waiting room and intermittently unconscious.  Patient is recently diagnosed with small fiber neuropathy by neurology.  Also micronutrient deficiency and has been on multiple vitamin supplementations.  Followed by EP, Dr. Fernande.  He had Holter monitor for 2 weeks in March.   In ED, vital stable.  In normal sinus rhythm.  CMP, CBC and serial troponin negative.  Cardiology consulted and seen patient on admission.  Patient has not had pauses or significant arrhythmia on telemetry throughout the night and day.  Evaluated by electrophysiology.  No explanation for his syncope but concern about possible autonomic dysfunction related to small fiber neuropathy.   After evaluation by electrophysiology, patient requested discharge to follow-up with neurology.  He is cleared for discharge by electrophysiology for outpatient follow-up.   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  Twelve-lead EKG NSR.  No events on telemetry other than occasional PACs.  Serial troponin and basic labs unrevealing.  TSH normal.  Had a normal TTE in 10/2023.  After evaluation by cardiology and electrophysiology, patient requested discharge to follow-up with neurology and electrophysiology outpatient.  Electrophysiology and cardiology in agreement with discharge.   Paresthesia: Reports numbness in his left face.  Also diminished light sensation in his left leg.  Unclear if this is related to his small fiber neuropathy.  It seems like he has left-sided numbness and paresthesia in the past.  His MRI in March was negative.  He stated that his neurologist was planning to get cervical imaging.  Outpatient follow-up with neurology    Questionable pauses : Not seen on EKG or telemetry.  Patient also had outpatient Holter monitor not sure if it picked of those pauses and PVCs.  Defer to cardiology.    History of small fiber neuropathy: Defer to outpatient therapy   History of POTS: Again continue per cardiology.   History of testicular cancer in remission.  Per last oncology note in 05/2023, repeat imaging study showed no evidence of disease and he does not need long-term follow-up.  See individual problem list below for more.             Time spent 35 minutes  Vital signs Vitals:   02/26/24 1310 02/26/24 1557 02/26/24 1558  02/26/24 1816  BP: 118/65  121/82   Pulse: 72 93 92 75  Temp: 98.1 F (36.7 C)  98 F (36.7 C)   Resp: 15 14 19 15   Weight:      SpO2: 99% 97% 97% 96%  TempSrc: Oral  Oral       Discharge exam  GENERAL: No apparent distress.  Nontoxic. HEENT: MMM.  Vision and hearing grossly intact.  NECK: Supple.  No apparent JVD.  RESP:  No IWOB.  Fair aeration bilaterally. CVS:  RRR. Heart sounds normal.  ABD/GI/GU: BS+. Abd soft, NTND.  MSK/EXT:  Moves extremities. No apparent deformity. No edema.  SKIN: no apparent skin lesion or wound NEURO: AA.  Oriented appropriately.  Clear speech.  No facial asymmetry.  PERRL.  Motor strength symmetric in all extremities.  Diminished light sensation in left lower extremity.  PSYCH: Calm. Normal affect.    Discharge Instructions Discharge Instructions     Diet general   Complete by: As directed    Discharge instructions   Complete by: As directed    It has been a pleasure taking care of you!  You were hospitalized due to possible syncope/near syncope.  It is unclear what caused your symptoms.  You may follow-up with your electrophysiologist and neurologist outpatient.    Take care,   Increase activity slowly   Complete by: As directed       Allergies as of 02/26/2024       Reactions   Codeine Nausea And Vomiting   Erythromycin Hives, Itching, Anaphylaxis   Iodine Anaphylaxis, Shortness Of Breath   Ioversol Anaphylaxis   Shellfish Allergy Anaphylaxis   Azithromycin Hives   Corn-containing Products Nausea And Vomiting, Swelling   Wheat Nausea And Vomiting   Dilaudid [hydromorphone Hcl] Nausea And Vomiting   Nsaids Swelling, Palpitations   Prednisone  Nausea And Vomiting, Anxiety   Tetracyclines & Related Rash        Medication List     TAKE these medications    albuterol  108 (90 Base) MCG/ACT inhaler Commonly known as: VENTOLIN  HFA Inhale 2 puffs into the lungs every 6 (six) hours as needed for shortness of breath or wheezing.   Biotin 5000 MCG Caps Take 5,000 mcg by mouth daily at 12 noon.   BL IRON PO Take 25 mg by mouth daily at 12 noon.   budesonide 3 MG 24 hr capsule Commonly known as:  ENTOCORT EC Take 9 mg by mouth daily.   folic acid  400 MCG tablet Commonly known as: FOLVITE  Take 400 mcg by mouth daily.   Vitamin C 125 MG Chew Chew 125 mg by mouth daily at 12 noon.   Vitamin D3 Maximum Strength 125 MCG (5000 UT) capsule Generic drug: Cholecalciferol Take 5,000 Units by mouth daily.   vitamin E 180 MG (400 UNITS) capsule Take 400 Units by mouth daily. Gamma 100 mg - Alpha   vitamin k 100 MCG tablet Take 100 mcg by mouth daily.        Consultations: Cardiology Electrophysiology  Procedures/Studies:   CT HEAD WO CONTRAST Result Date: 02/25/2024 CLINICAL DATA:  Syncope/presyncope, cerebrovascular cause suspected EXAM: CT HEAD WITHOUT CONTRAST TECHNIQUE: Contiguous axial images were obtained from the base of the skull through the vertex without intravenous contrast. RADIATION DOSE REDUCTION: This exam was performed according to the departmental dose-optimization program which includes automated exposure control, adjustment of the mA and/or kV according to patient size and/or use of iterative reconstruction technique. COMPARISON:  12/20/2023 FINDINGS: Brain: No  acute intracranial abnormality. Specifically, no hemorrhage, hydrocephalus, mass lesion, acute infarction, or significant intracranial injury. Vascular: No hyperdense vessel or unexpected calcification. Skull: No acute calvarial abnormality. Sinuses/Orbits: No acute findings Other: None IMPRESSION: No acute intracranial abnormality. Electronically Signed   By: Franky Crease M.D.   On: 02/25/2024 18:00   DG Chest Port 1 View Result Date: 02/25/2024 CLINICAL DATA:  Chest pain. EXAM: PORTABLE CHEST 1 VIEW COMPARISON:  October 10, 2023. FINDINGS: The heart size and mediastinal contours are within normal limits. Both lungs are clear. The visualized skeletal structures are unremarkable. IMPRESSION: No active disease. Electronically Signed   By: Lynwood Landy Raddle M.D.   On: 02/25/2024 17:28   VAS US  ABI WITH/WO  TBI Result Date: 01/28/2024  LOWER EXTREMITY DOPPLER STUDY Patient Name:  Manuel [name]  Date of Exam:   01/28/2024 Medical Rec #: [id number]             Accession #:    [id number] Date of Birth: August 26, 1981            Patient Gender: M Patient Age:   47 years Exam Location:  [address] Procedure:      VAS US  ABI WITH/WO TBI Referring Phys: OZELL PASSEY --------------------------------------------------------------------------------  Indications: Rest pain. High Risk Factors: None.  Comparison Study: None. Performing Technologist: Garnette Rockers  Examination Guidelines: A complete evaluation includes at minimum, Doppler waveform signals and systolic blood pressure reading at the level of bilateral brachial, anterior tibial, and posterior tibial arteries, when vessel segments are accessible. Bilateral testing is considered an integral part of a complete examination. Photoelectric Plethysmograph (PPG) waveforms and toe systolic pressure readings are included as required and additional duplex testing as needed. Limited examinations for reoccurring indications may be performed as noted.  ABI Findings: +--------+------------------+-----+--------+--------+ Right   Rt Pressure (mmHg)IndexWaveformComment  +--------+------------------+-----+--------+--------+ [id number]                                     +--------+------------------+-----+--------+--------+ PTA     135               1.24 biphasic         +--------+------------------+-----+--------+--------+ DP      117               1.07 biphasic         +--------+------------------+-----+--------+--------+ +--------+------------------+-----+---------+-------+ Left    Lt Pressure (mmHg)IndexWaveform Comment +--------+------------------+-----+---------+-------+ [id number]                                     +--------+------------------+-----+---------+-------+ PTA     136               1.25 biphasic          +--------+------------------+-----+---------+-------+ DP      126               1.16 triphasic        +--------+------------------+-----+---------+-------+ +-------+-----------+-----------+------------+------------+ ABI/TBIToday's ABIToday's TBIPrevious ABIPrevious TBI +-------+-----------+-----------+------------+------------+ Right  1.24                                           +-------+-----------+-----------+------------+------------+ Left   1.25                                           +-------+-----------+-----------+------------+------------+  Summary: Right: Resting right ankle-brachial index is within normal range. Left: Resting left ankle-brachial index is within normal range. *See table(s) above for measurements and observations.  Electronically signed by Penne Colorado MD on 01/28/2024 at 11:47:23 AM.    Final        The results of significant diagnostics from this hospitalization (including imaging, microbiology, ancillary and laboratory) are listed below for reference.     Microbiology: No results found for this or any previous visit (from the past 240 hours).   Labs:  CBC: Recent Labs  Lab 02/25/24 1714 02/25/24 2251 02/26/24 0550  WBC 8.9 9.2 8.3  NEUTROABS 5.2  --   --   HGB 15.2 13.7 13.3  HCT 44.3 40.5 40.0  MCV 87.4 87.3 87.7  PLT 316 302 259   BMP &GFR Recent Labs  Lab 02/25/24 1714 02/25/24 2251 02/26/24 0550  NA 139  --  137  K 4.0  --  4.1  CL 101  --  107  CO2 27  --  22  GLUCOSE 94  --  94  BUN 19  --  17  CREATININE 1.07 1.01 0.93  CALCIUM 9.8  --  9.0   Estimated Creatinine Clearance: 92.8 mL/min (by C-G formula based on SCr of 0.93 mg/dL). Liver & Pancreas: Recent Labs  Lab 02/25/24 1714 02/26/24 0550  AST 17 14*  ALT 15 16  ALKPHOS 71 46  BILITOT 0.7 1.1  PROT 7.3 6.1*  ALBUMIN 4.5 3.5   No results for input(s): LIPASE, AMYLASE in the last 168 hours. No results for input(s): AMMONIA in the last 168  hours. Diabetic: No results for input(s): HGBA1C in the last 72 hours. Recent Labs  Lab 02/25/24 1658 02/26/24 0532  GLUCAP 102* 98   Cardiac Enzymes: No results for input(s): CKTOTAL, CKMB, CKMBINDEX, TROPONINI in the last 168 hours. No results for input(s): PROBNP in the last 8760 hours. Coagulation Profile: No results for input(s): INR, PROTIME in the last 168 hours. Thyroid  Function Tests: Recent Labs    02/26/24 0550  TSH 1.572  FREET4 1.00   Lipid Profile: No results for input(s): CHOL, HDL, LDLCALC, TRIG, CHOLHDL, LDLDIRECT in the last 72 hours. Anemia Panel: No results for input(s): VITAMINB12, FOLATE, FERRITIN, TIBC, IRON, RETICCTPCT in the last 72 hours. Urine analysis:    Component Value Date/Time   COLORURINE COLORLESS (A) 06/23/2023 1619   APPEARANCEUR CLEAR 06/23/2023 1619   LABSPEC 1.003 (L) 06/23/2023 1619   PHURINE 6.0 06/23/2023 1619   GLUCOSEU NEGATIVE 06/23/2023 1619   HGBUR NEGATIVE 06/23/2023 1619   BILIRUBINUR NEGATIVE 06/23/2023 1619   KETONESUR NEGATIVE 06/23/2023 1619   PROTEINUR NEGATIVE 06/23/2023 1619   UROBILINOGEN 0.2 08/21/2014 1052   NITRITE NEGATIVE 06/23/2023 1619   LEUKOCYTESUR NEGATIVE 06/23/2023 1619   Sepsis Labs: Invalid input(s): PROCALCITONIN, LACTICIDVEN   SIGNED:  Marlee Armenteros T Bradan Congrove, MD  [facility] 02/26/2024, 6:21 PM

## 2024-02-27 DIAGNOSIS — K909 Intestinal malabsorption, unspecified: Secondary | ICD-10-CM | POA: Diagnosis not present

## 2024-02-27 DIAGNOSIS — K2960 Other gastritis without bleeding: Secondary | ICD-10-CM | POA: Diagnosis not present

## 2024-02-27 DIAGNOSIS — G629 Polyneuropathy, unspecified: Secondary | ICD-10-CM | POA: Diagnosis not present

## 2024-03-02 DIAGNOSIS — G629 Polyneuropathy, unspecified: Secondary | ICD-10-CM | POA: Diagnosis not present

## 2024-03-02 DIAGNOSIS — G90A Postural orthostatic tachycardia syndrome (POTS): Secondary | ICD-10-CM

## 2024-03-03 ENCOUNTER — Ambulatory Visit: Payer: Self-pay | Admitting: Physician Assistant

## 2024-03-03 DIAGNOSIS — R7989 Other specified abnormal findings of blood chemistry: Secondary | ICD-10-CM | POA: Diagnosis not present

## 2024-03-03 DIAGNOSIS — E559 Vitamin D deficiency, unspecified: Secondary | ICD-10-CM | POA: Diagnosis not present

## 2024-03-03 DIAGNOSIS — K909 Intestinal malabsorption, unspecified: Secondary | ICD-10-CM | POA: Diagnosis not present

## 2024-03-03 DIAGNOSIS — G629 Polyneuropathy, unspecified: Secondary | ICD-10-CM | POA: Diagnosis not present

## 2024-03-06 ENCOUNTER — Other Ambulatory Visit: Payer: Self-pay

## 2024-03-06 ENCOUNTER — Emergency Department (HOSPITAL_COMMUNITY)

## 2024-03-06 ENCOUNTER — Encounter (HOSPITAL_COMMUNITY): Payer: Self-pay

## 2024-03-06 ENCOUNTER — Emergency Department (HOSPITAL_COMMUNITY)
Admission: EM | Admit: 2024-03-06 | Discharge: 2024-03-07 | Disposition: A | Discharge status: Home / Self Care | Attending: Emergency Medicine | Admitting: Emergency Medicine

## 2024-03-06 DIAGNOSIS — M5023 Other cervical disc displacement, cervicothoracic region: Secondary | ICD-10-CM | POA: Diagnosis not present

## 2024-03-06 DIAGNOSIS — R29818 Other symptoms and signs involving the nervous system: Secondary | ICD-10-CM | POA: Diagnosis not present

## 2024-03-06 DIAGNOSIS — R55 Syncope and collapse: Secondary | ICD-10-CM

## 2024-03-06 DIAGNOSIS — M5021 Other cervical disc displacement,  high cervical region: Secondary | ICD-10-CM | POA: Diagnosis not present

## 2024-03-06 DIAGNOSIS — R110 Nausea: Secondary | ICD-10-CM | POA: Diagnosis not present

## 2024-03-06 DIAGNOSIS — R569 Unspecified convulsions: Secondary | ICD-10-CM | POA: Insufficient documentation

## 2024-03-06 DIAGNOSIS — E876 Hypokalemia: Secondary | ICD-10-CM | POA: Insufficient documentation

## 2024-03-06 DIAGNOSIS — M47812 Spondylosis without myelopathy or radiculopathy, cervical region: Secondary | ICD-10-CM | POA: Diagnosis not present

## 2024-03-06 DIAGNOSIS — M50223 Other cervical disc displacement at C6-C7 level: Secondary | ICD-10-CM | POA: Diagnosis not present

## 2024-03-06 LAB — CBC WITH DIFFERENTIAL/PLATELET
Abs Immature Granulocytes: 0.02 K/uL (ref 0.00–0.07)
Basophils Absolute: 0.1 K/uL (ref 0.0–0.1)
Basophils Relative: 1 %
Eosinophils Absolute: 0.3 K/uL (ref 0.0–0.5)
Eosinophils Relative: 4 %
HCT: 39.9 % (ref 39.0–52.0)
Hemoglobin: 13.1 g/dL (ref 13.0–17.0)
Immature Granulocytes: 0 %
Lymphocytes Relative: 26 %
Lymphs Abs: 2.2 K/uL (ref 0.7–4.0)
MCH: 29 pg (ref 26.0–34.0)
MCHC: 32.8 g/dL (ref 30.0–36.0)
MCV: 88.3 fL (ref 80.0–100.0)
Monocytes Absolute: 0.5 K/uL (ref 0.1–1.0)
Monocytes Relative: 6 %
Neutro Abs: 5.2 K/uL (ref 1.7–7.7)
Neutrophils Relative %: 63 %
Platelets: 310 K/uL (ref 150–400)
RBC: 4.52 MIL/uL (ref 4.22–5.81)
RDW: 12.3 % (ref 11.5–15.5)
WBC: 8.3 K/uL (ref 4.0–10.5)
nRBC: 0 % (ref 0.0–0.2)

## 2024-03-06 LAB — BASIC METABOLIC PANEL WITH GFR
Anion gap: 7 (ref 5–15)
BUN: 15 mg/dL (ref 6–20)
CO2: 21 mmol/L — ABNORMAL LOW (ref 22–32)
Calcium: 7.5 mg/dL — ABNORMAL LOW (ref 8.9–10.3)
Chloride: 114 mmol/L — ABNORMAL HIGH (ref 98–111)
Creatinine, Ser: 0.76 mg/dL (ref 0.61–1.24)
GFR, Estimated: >60 mL/min (ref >60)
Glucose, Bld: 99 mg/dL (ref 70–99)
Potassium: 3 mmol/L — ABNORMAL LOW (ref 3.5–5.1)
Sodium: 142 mmol/L (ref 135–145)

## 2024-03-06 LAB — MAGNESIUM: Magnesium: 1.7 mg/dL (ref 1.7–2.4)

## 2024-03-06 LAB — CBG MONITORING, ED: Glucose-Capillary: 116 mg/dL — ABNORMAL HIGH (ref 70–99)

## 2024-03-06 MED ORDER — LORAZEPAM 2 MG/ML IJ SOLN: 0.5000 mg | Freq: Once | INTRAMUSCULAR | Status: DC

## 2024-03-06 NOTE — ED Notes (Signed)
 This nurse called main lab to add on the magnesium lab to the previously collected labs.

## 2024-03-06 NOTE — ED Notes (Signed)
 Patient transported to MRI

## 2024-03-06 NOTE — ED Provider Notes (Addendum)
 [facility] AT [facility] Provider Note   CSN: [id number] Arrival date & time: 03/06/24  1708     Patient presents with: Loss of Consciousness   Manuel [name] is a 43 y.o. male who reports having a acute episode of of altered consciousness when he was at home.  Episode began with a loss of sensation in bilateral lower extremities, this is followed by left-sided vision loss, as well as numbness in the left arm and left side of the face along with loss of motor function.  EMS was called for this patient by family members who state that he was unresponsive for several minutes, currently presents as lethargic, and endorsing generalized malaise, continued bilateral lower extremity paresthesia.  It is noted this patient has an extensive medical history with previous diagnoses of postural orthostatic tachycardia syndrome, small fiber neuropathy, and collagenous gastritis.  Also had a previous diagnosis of testicular cancer treated with radiation.  He endorses taking 5 g of biotin daily for nutrient deficiency.  He was recently discharged from this facility on 26 June from a previous episode that was similar to the current episode.  Extensive lab workup at that time did not show any acute abnormalities with all metabolic panels and review of his hematology being unremarkable as well as thyroid  function markers are completely unremarkable.  CT obtained on 25 June did not demonstrate any acute abnormalities.  He does have orders placed for MR of the brain with his neurologist, and also has cardiac MR scheduled.    Loss of Consciousness Associated symptoms: dizziness, headaches and weakness        Prior to Admission medications   Medication Sig Start Date End Date Taking? Authorizing Provider  albuterol  (VENTOLIN  HFA) 108 (90 Base) MCG/ACT inhaler Inhale 2 puffs into the lungs every 6 (six) hours as needed for shortness of breath or wheezing. 09/23/23 09/22/24   [provider]  Ascorbic Acid  (VITAMIN C) 125 MG CHEW Chew 125 mg by mouth daily at 12 noon.    [provider]  Biotin 5000 MCG CAPS Take 5,000 mcg by mouth daily at 12 noon.    [provider]  budesonide  (ENTOCORT EC ) 3 MG 24 hr capsule Take 9 mg by mouth daily. Patient not taking: Reported on 02/26/2024 02/17/24 03/18/24  [provider]  Cholecalciferol (VITAMIN D3 MAXIMUM STRENGTH) 125 MCG (5000 UT) capsule Take 5,000 Units by mouth daily.    [provider]  Ferrous Sulfate (BL IRON PO) Take 25 mg by mouth daily at 12 noon.    [provider]  folic acid  (FOLVITE ) 400 MCG tablet Take 400 mcg by mouth daily.    [provider]  vitamin E 180 MG (400 UNITS) capsule Take 400 Units by mouth daily. Gamma 100 mg - Alpha    [provider]  vitamin k 100 MCG tablet Take 100 mcg by mouth daily.    [provider]    Allergies: Codeine, Erythromycin, Iodine, Ioversol, Shellfish allergy, Azithromycin, Corn-containing products, Wheat, Dilaudid [hydromorphone hcl], Nsaids, Prednisone , and Tetracyclines & related    Review of Systems  Constitutional:  Positive for fatigue.  Cardiovascular:  Positive for syncope.  Musculoskeletal:  Positive for myalgias.  Neurological:  Positive for dizziness, syncope, weakness and headaches.  All other systems reviewed and are negative.   Updated Vital Signs BP 114/73   Pulse 69   Temp 98.5 F (36.9 C) (Oral)   Resp 13   Ht 5'  10 (1.778 m)   Wt 59 kg   SpO2 100%   BMI 18.65 kg/m   Physical Exam Vitals and nursing note reviewed.  Constitutional:      General: He is not in acute distress.    Appearance: Normal appearance.  HENT:     Head: Normocephalic and atraumatic.     Mouth/Throat:     Mouth: Mucous membranes are moist.     Pharynx: Oropharynx is clear.  Eyes:     General: No visual field deficit.    Extraocular Movements: Extraocular movements intact.      Conjunctiva/sclera: Conjunctivae normal.     Pupils: Pupils are equal, round, and reactive to light.  Cardiovascular:     Rate and Rhythm: Normal rate and regular rhythm.     Pulses: Normal pulses.     Heart sounds: Normal heart sounds. No murmur heard.    No friction rub. No gallop.  Pulmonary:     Effort: Pulmonary effort is normal.     Breath sounds: Normal breath sounds.  Abdominal:     General: Abdomen is flat. Bowel sounds are normal.     Palpations: Abdomen is soft.  Musculoskeletal:        General: Normal range of motion.     Cervical back: Normal range of motion and neck supple.     Right lower leg: No edema.     Left lower leg: No edema.  Skin:    General: Skin is warm and dry.     Capillary Refill: Capillary refill takes less than 2 seconds.  Neurological:     General: No focal deficit present.     Mental Status: He is alert and oriented to person, place, and time. Mental status is at baseline.     GCS: GCS eye subscore is 4. GCS verbal subscore is 5. GCS motor subscore is 6.     Cranial Nerves: No cranial nerve deficit, dysarthria or facial asymmetry.     Sensory: Sensation is intact.     Motor: Motor function is intact.     Coordination: Coordination is intact.     Comments: No pronator drift appreciated  Psychiatric:        Mood and Affect: Mood normal.     (all labs ordered are listed, but only abnormal results are displayed) Labs Reviewed  BASIC METABOLIC PANEL WITH GFR - Abnormal; Notable for the following components:      Result Value   Potassium 3.0 (*)    Chloride 114 (*)    CO2 21 (*)    Calcium 7.5 (*)    All other components within normal limits  CBG MONITORING, ED - Abnormal; Notable for the following components:   Glucose-Capillary 116 (*)    All other components within normal limits  CBC WITH DIFFERENTIAL/PLATELET  MAGNESIUM    EKG: None  Radiology: MR BRAIN WO CONTRAST Result Date: 03/06/2024 CLINICAL DATA:  Initial evaluation for  acute syncope/presyncope. EXAM: MRI HEAD WITHOUT CONTRAST TECHNIQUE: Multiplanar, multiecho pulse sequences of the brain and surrounding structures were obtained without intravenous contrast. COMPARISON:  Prior CT from earlier the same day. FINDINGS: Brain: Cerebral volume within normal limits for age. No focal parenchymal signal abnormality. No abnormal foci of restricted diffusion to suggest acute or subacute ischemia. Gray-white matter differentiation well maintained. No encephalomalacia to suggest chronic cortical infarction or other insult. No foci of susceptibility artifact indicative of acute or chronic intracranial blood products. No mass lesion, midline shift or mass effect. Ventricles  normal in size and morphology without hydrocephalus. No extra-axial fluid collection. Pituitary gland and suprasellar region within normal limits. Vascular: Major intracranial vascular flow voids are well maintained. Skull and upper cervical spine: Craniocervical junction within normal limits. Visualized upper cervical spine demonstrates no significant finding. Bone marrow signal intensity within normal limits. No scalp soft tissue abnormality. Sinuses/Orbits: Globes and orbital soft tissues are within normal limits. Mild mucosal thickening about the ethmoidal air cells and maxillary sinuses. No mastoid effusion. Other: None. IMPRESSION: Normal brain MRI. No acute intracranial abnormality identified. Electronically Signed   By: Morene Hoard M.D.   On: 03/06/2024 21:31   MR Cervical Spine Wo Contrast Result Date: 03/06/2024 CLINICAL DATA:  Initial evaluation for chronic neck pain. EXAM: MRI CERVICAL SPINE WITHOUT CONTRAST TECHNIQUE: Multiplanar, multisequence MR imaging of the cervical spine was performed. No intravenous contrast was administered. COMPARISON:  None Available. FINDINGS: Alignment: Straightening of the normal cervical lordosis. Trace anterolisthesis of C2 on C3 and C3 on C4. Vertebrae: Vertebral body  height maintained without acute or chronic fracture. Bone marrow signal intensity within normal limits. No worrisome osseous lesions. No abnormal marrow edema. Cord: Normal signal and morphology. Posterior Fossa, vertebral arteries, paraspinal tissues: Unremarkable. Disc levels: C2-C3: Trace anterolisthesis. Tiny central disc protrusion minimally indents the ventral thecal sac. No spinal stenosis. Foramina remain patent. C3-C4: Small right paracentral disc protrusion indents the right ventral thecal sac (series 9, image 20). No spinal stenosis. Foramina remain patent. C4-C5: Mild left-sided uncovertebral spurring without significant disc bulge. No canal or foraminal stenosis. C5-C6: Mild bilateral uncovertebral spurring without significant disc bulge. No stenosis. C6-C7: Minimal disc bulge with uncovertebral spurring. No canal or foraminal stenosis. C7-T1: Minimal disc bulge with uncovertebral spurring. No canal or foraminal stenosis. IMPRESSION: 1. Small right paracentral disc protrusion at C3-4 without significant stenosis or impingement. 2. Additional mild for age spondylosis elsewhere within the cervical spine as above. No other significant stenosis or neural impingement. Electronically Signed   By: Morene Hoard M.D.   On: 03/06/2024 20:53   CT HEAD WO CONTRAST Result Date: 03/06/2024 CLINICAL DATA:  Syncope EXAM: CT HEAD WITHOUT CONTRAST TECHNIQUE: Contiguous axial images were obtained from the base of the skull through the vertex without intravenous contrast. RADIATION DOSE REDUCTION: This exam was performed according to the departmental dose-optimization program which includes automated exposure control, adjustment of the mA and/or kV according to patient size and/or use of iterative reconstruction technique. COMPARISON:  02/25/2024 FINDINGS: Brain: No acute intracranial abnormality. Specifically, no hemorrhage, hydrocephalus, mass lesion, acute infarction, or significant intracranial injury.  Vascular: No hyperdense vessel or unexpected calcification. Skull: No acute calvarial abnormality. Sinuses/Orbits: No acute findings Other: None IMPRESSION: No acute intracranial abnormality. Electronically Signed   By: Franky Crease M.D.   On: 03/06/2024 19:07     Procedures   Medications Ordered in the ED  LORazepam  (ATIVAN ) injection 0.5 mg (0.5 mg Intravenous Patient Refused/Not Given 03/06/24 2017)  potassium chloride  SA (KLOR-CON  M) CR tablet 40 mEq (has no administration in time range)    Clinical Course as of 03/07/24 0049  Sun Mar 07, 2024  0041 Potassium(!): 3.0 [JG]    Clinical Course User Index [JG] Myriam Dorn BROCKS, PA                                 Medical Decision Making Amount and/or Complexity of Data Reviewed Labs: ordered. Decision-making details documented in ED Course. Radiology:  ordered.  Risk Prescription drug management.   Medical Decision Making:   ROBBI SCURLOCK [name] is a 43 y.o. male who presented to the ED today with acute loss of consciousness detailed above.    Additional history discussed with patient's family/caregivers.  External chart has been reviewed including prior lab reports as well as prior imaging reports and neurology/family medicine notes.. Patient's presentation is complicated by their history of POTS, collagenous gastritis.  Patient placed on continuous vitals and telemetry monitoring while in ED which was reviewed periodically.  Complete initial physical exam performed, notably the patient  was alert and oriented in no apparent distress but obviously lethargic.  Physical exam as noted..    Reviewed and confirmed nursing documentation for past medical history, family history, social history.    Initial Assessment:   With the patient's presentation of syncope/loss of consciousness, differential includes acute neurovascular insult/TIA, continued sequelae of POTS, cardiac arrhythmia.   Initial Plan:  Obtain CT imaging of the head  to assess for acute neurovascular insult.  Consider obtaining MRI of the brain based on previous orders for such per neurology and findings from CT. Screening labs including CBC and Metabolic panel to evaluate for infectious or metabolic etiology of disease.  EKG to evaluate for cardiac pathology Objective evaluation as below reviewed   Initial Study Results:   Laboratory  All laboratory results reviewed without evidence of clinically relevant pathology.   Exceptions include: Potassium 3.0  EKG EKG was reviewed independently. Rate, rhythm, axis, intervals all examined and without medically relevant abnormality. ST segments without concerns for elevations.    Radiology:  All images reviewed independently. Agree with radiology report at this time.   MR BRAIN WO CONTRAST Result Date: 03/06/2024 CLINICAL DATA:  Initial evaluation for acute syncope/presyncope. EXAM: MRI HEAD WITHOUT CONTRAST TECHNIQUE: Multiplanar, multiecho pulse sequences of the brain and surrounding structures were obtained without intravenous contrast. COMPARISON:  Prior CT from earlier the same day. FINDINGS: Brain: Cerebral volume within normal limits for age. No focal parenchymal signal abnormality. No abnormal foci of restricted diffusion to suggest acute or subacute ischemia. Gray-white matter differentiation well maintained. No encephalomalacia to suggest chronic cortical infarction or other insult. No foci of susceptibility artifact indicative of acute or chronic intracranial blood products. No mass lesion, midline shift or mass effect. Ventricles normal in size and morphology without hydrocephalus. No extra-axial fluid collection. Pituitary gland and suprasellar region within normal limits. Vascular: Major intracranial vascular flow voids are well maintained. Skull and upper cervical spine: Craniocervical junction within normal limits. Visualized upper cervical spine demonstrates no significant finding. Bone marrow signal  intensity within normal limits. No scalp soft tissue abnormality. Sinuses/Orbits: Globes and orbital soft tissues are within normal limits. Mild mucosal thickening about the ethmoidal air cells and maxillary sinuses. No mastoid effusion. Other: None. IMPRESSION: Normal brain MRI. No acute intracranial abnormality identified. Electronically Signed   By: Morene Hoard M.D.   On: 03/06/2024 21:31   MR Cervical Spine Wo Contrast Result Date: 03/06/2024 CLINICAL DATA:  Initial evaluation for chronic neck pain. EXAM: MRI CERVICAL SPINE WITHOUT CONTRAST TECHNIQUE: Multiplanar, multisequence MR imaging of the cervical spine was performed. No intravenous contrast was administered. COMPARISON:  None Available. FINDINGS: Alignment: Straightening of the normal cervical lordosis. Trace anterolisthesis of C2 on C3 and C3 on C4. Vertebrae: Vertebral body height maintained without acute or chronic fracture. Bone marrow signal intensity within normal limits. No worrisome osseous lesions. No abnormal marrow edema. Cord: Normal signal and morphology. Posterior  Fossa, vertebral arteries, paraspinal tissues: Unremarkable. Disc levels: C2-C3: Trace anterolisthesis. Tiny central disc protrusion minimally indents the ventral thecal sac. No spinal stenosis. Foramina remain patent. C3-C4: Small right paracentral disc protrusion indents the right ventral thecal sac (series 9, image 20). No spinal stenosis. Foramina remain patent. C4-C5: Mild left-sided uncovertebral spurring without significant disc bulge. No canal or foraminal stenosis. C5-C6: Mild bilateral uncovertebral spurring without significant disc bulge. No stenosis. C6-C7: Minimal disc bulge with uncovertebral spurring. No canal or foraminal stenosis. C7-T1: Minimal disc bulge with uncovertebral spurring. No canal or foraminal stenosis. IMPRESSION: 1. Small right paracentral disc protrusion at C3-4 without significant stenosis or impingement. 2. Additional mild for age  spondylosis elsewhere within the cervical spine as above. No other significant stenosis or neural impingement. Electronically Signed   By: Morene Hoard M.D.   On: 03/06/2024 20:53   CT HEAD WO CONTRAST Result Date: 03/06/2024 CLINICAL DATA:  Syncope EXAM: CT HEAD WITHOUT CONTRAST TECHNIQUE: Contiguous axial images were obtained from the base of the skull through the vertex without intravenous contrast. RADIATION DOSE REDUCTION: This exam was performed according to the departmental dose-optimization program which includes automated exposure control, adjustment of the mA and/or kV according to patient size and/or use of iterative reconstruction technique. COMPARISON:  02/25/2024 FINDINGS: Brain: No acute intracranial abnormality. Specifically, no hemorrhage, hydrocephalus, mass lesion, acute infarction, or significant intracranial injury. Vascular: No hyperdense vessel or unexpected calcification. Skull: No acute calvarial abnormality. Sinuses/Orbits: No acute findings Other: None IMPRESSION: No acute intracranial abnormality. Electronically Signed   By: Franky Crease M.D.   On: 03/06/2024 19:07   LONG TERM MONITOR (3-14 DAYS) Result Date: 03/02/2024 14 day monitor Predominant rhythm: Sinus Sinus HR: 47 - 135 bpm, AVG 68 bpm < 1% atrial ectopy <1% ventricular ectopy Arrhythmia detected: none Patient triggered events: isolated ectopic beats   CT HEAD WO CONTRAST Result Date: 02/25/2024 CLINICAL DATA:  Syncope/presyncope, cerebrovascular cause suspected EXAM: CT HEAD WITHOUT CONTRAST TECHNIQUE: Contiguous axial images were obtained from the base of the skull through the vertex without intravenous contrast. RADIATION DOSE REDUCTION: This exam was performed according to the departmental dose-optimization program which includes automated exposure control, adjustment of the mA and/or kV according to patient size and/or use of iterative reconstruction technique. COMPARISON:  12/20/2023 FINDINGS: Brain: No acute  intracranial abnormality. Specifically, no hemorrhage, hydrocephalus, mass lesion, acute infarction, or significant intracranial injury. Vascular: No hyperdense vessel or unexpected calcification. Skull: No acute calvarial abnormality. Sinuses/Orbits: No acute findings Other: None IMPRESSION: No acute intracranial abnormality. Electronically Signed   By: Franky Crease M.D.   On: 02/25/2024 18:00   DG Chest Port 1 View Result Date: 02/25/2024 CLINICAL DATA:  Chest pain. EXAM: PORTABLE CHEST 1 VIEW COMPARISON:  October 10, 2023. FINDINGS: The heart size and mediastinal contours are within normal limits. Both lungs are clear. The visualized skeletal structures are unremarkable. IMPRESSION: No active disease. Electronically Signed   By: Lynwood Landy Raddle M.D.   On: 02/25/2024 17:28      Consults: Case discussed with S. Kahliqdina, MD.   Reassessment and Plan:   Consulted with neurology for review of of findings, they recommended MRA of the head and neck to rule out basilar occlusion, arterial stenosis.  If clear, can follow-up with neurology outpatient which she has an appointment on this coming Tuesday.  Potassium supplementation given for hypokalemia 3.0.       Final diagnoses:  None    ED Discharge Orders     None  Myriam Dorn BROCKS, PA 03/07/24 0047    Myriam Dorn BROCKS, PA 03/07/24 9950    Armenta Canning, MD 03/13/24 [phone]

## 2024-03-06 NOTE — ED Triage Notes (Signed)
 Pt bib ems with reports of syncopal episode, dizziness, nausea and anxiety. Reports abnormal sensation everywhere. 124/82 HR 90 18 RR CBG 130

## 2024-03-07 ENCOUNTER — Emergency Department (HOSPITAL_COMMUNITY)

## 2024-03-07 DIAGNOSIS — R29818 Other symptoms and signs involving the nervous system: Secondary | ICD-10-CM | POA: Diagnosis not present

## 2024-03-07 MED ORDER — POTASSIUM CHLORIDE CRYS ER 20 MEQ PO TBCR
40.0000 meq | EXTENDED_RELEASE_TABLET | Freq: Once | ORAL | Status: AC
  Administered 2024-03-07: 40 meq via ORAL
  Filled 2024-03-07: qty 2

## 2024-03-07 NOTE — ED Provider Notes (Addendum)
  Physical Exam  BP 105/69 (BP Location: Right Arm)   Pulse 64   Temp 98.6 F (37 C) (Oral)   Resp 18   Ht 5' 10 (1.778 m)   Wt 59 kg   SpO2 100%   BMI 18.65 kg/m   Physical Exam  Procedures  Procedures  ED Course / MDM   Clinical Course as of 03/07/24 0405  Sun Mar 07, 2024  0041 Potassium(!): 3.0 [JG]    Clinical Course User Index [JG] Myriam Dorn BROCKS, PA   Medical Decision Making Amount and/or Complexity of Data Reviewed Labs: ordered. Decision-making details documented in ED Course. Radiology: ordered.  Risk Prescription drug management.  Care of this patient assumed from preceding ED provider DOROTHA Myriam, PA-C at time of shift change.  Please see his associated note for further insight of patient's ED course.  In brief patient is a 43 year old male presents with constellation of neurologic symptoms that were transient and spontaneously resolved, with the exception of L facial numbness.  History of Small Fiber neuropathy and has concern that recent syncopal episodes are related to SFN associated autonomic dysfunction. He has outpatient neuro and cardiology follow-up scheduled for 7/8 with the specialist at [facility]. Also has hx of collagenous gastritis.  The patient was recently admitted here for syncopal episode, with largely reassuring cardiologic workup, revealing only occasional PACs and blocked PACs. ILR was offered, but patient declined placement at time of admission.   He has had reassuring workup in the ED thus far, at time of shift change patient is pending MRA head and neck.  If reassuring, plan is for discharge home and continuation of close outpatient neurologic follow-up.  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  Case was discussed with  neurologist again, Dr.Khaliqdina, who states we will proceed with MRA neck without contrast; if grossly abnormal would proceed with contrasted study.  MRAs are very reassuring.  While the exact etiology of the patient's symptoms remains unclear, there does not appear to be any emergent underlying condition at this time that would warrant further ED workup and inpatient management. His symptoms are likely related to his Small Fiber Neuropathy and associated autonomic dysfunction; he will be best served by close outpatient follow up with his specialists scheduled for 48 hours from now.  Strict return precautions given.  Madeleine and his wife voiced understanding of her medical evaluation and treatment plan. Each of their questions answered to their expressed satisfaction.  Return precautions were given.  Patient is well-appearing, stable, and was discharged in good condition.  This chart was dictated using voice recognition software, Dragon. Despite the best efforts of this provider to proofread and correct errors, errors may still occur which can change documentation meaning.     Bobette Pleasant SAUNDERS, PA-C 03/07/24 [id number]    Albertina Dixon, MD 03/07/24 [phone]

## 2024-03-07 NOTE — Discharge Instructions (Signed)
 Your workup today was very reassuring.  While the exact cause of your symptoms remains unclear, there does not appear to be any emergent underlying condition at this time.  Please follow-up closely with your neurologist as previously scheduled for 7/8.  Return to the ER develop any new severe symptoms.

## 2024-03-07 NOTE — ED Notes (Signed)
 Patient currently in MRI.

## 2024-03-08 DIAGNOSIS — R55 Syncope and collapse: Secondary | ICD-10-CM | POA: Diagnosis not present

## 2024-03-08 DIAGNOSIS — G90A Postural orthostatic tachycardia syndrome (POTS): Secondary | ICD-10-CM | POA: Diagnosis not present

## 2024-03-08 DIAGNOSIS — G43709 Chronic migraine without aura, not intractable, without status migrainosus: Secondary | ICD-10-CM | POA: Diagnosis not present

## 2024-03-12 ENCOUNTER — Other Ambulatory Visit: Payer: Self-pay | Admitting: Gastroenterology

## 2024-04-01 ENCOUNTER — Other Ambulatory Visit: Payer: Self-pay | Admitting: Gastroenterology

## 2024-04-01 ENCOUNTER — Ambulatory Visit: Attending: Cardiology | Admitting: Cardiology

## 2024-04-01 ENCOUNTER — Encounter: Payer: Self-pay | Admitting: *Deleted

## 2024-04-01 ENCOUNTER — Encounter: Payer: Self-pay | Admitting: Cardiology

## 2024-04-01 VITALS — BP 118/70 | HR 84 | Ht 70.0 in | Wt 135.3 lb

## 2024-04-01 DIAGNOSIS — Q2112 Patent foramen ovale: Secondary | ICD-10-CM

## 2024-04-01 DIAGNOSIS — R0789 Other chest pain: Secondary | ICD-10-CM

## 2024-04-01 DIAGNOSIS — G909 Disorder of the autonomic nervous system, unspecified: Secondary | ICD-10-CM | POA: Diagnosis not present

## 2024-04-01 DIAGNOSIS — R002 Palpitations: Secondary | ICD-10-CM | POA: Diagnosis not present

## 2024-04-01 NOTE — Progress Notes (Signed)
 Electrophysiology Office Note:   Date:  04/01/2024  ID:  Chesley ONEIDA Bimler III, DOB 06/05/1981, MRN [id number]  Primary Cardiologist: Lynwood Schilling, MD Primary Heart Failure: None Electrophysiologist: Elspeth Sage, MD      History of Present Illness:   YOSHI MANCILLAS III is a 43 y.o. male with h/o small fiber neuropathy (biopsy-proven), testicular cancer, POTS, seizures seen today for post hospital follow up.    Admitted 02/26/2024 with an episode of syncope.  He presented to [facility] at the time with an episode of syncope.  He has had multiple episodes of syncope in the past.  He reported pauses in his heart rhythm, though no telemetry monitoring was in the computer.  He was having quite a few PACs that made him quite symptomatic.  Discussed the use of AI scribe software for clinical note transcription with the patient, who gave verbal consent to proceed.  History of Present Illness  Discussed the use of AI scribe software for clinical note transcription with the patient, who gave verbal consent to proceed.  History of Present Illness   PIO EATHERLY is a 43 year old male with severe autonomic neuropathy who presents with episodes of syncope and numbness. He was referred by his neurologist for further evaluation of his cardiac symptoms.  He experiences episodes of syncope with gradual vision loss and difficulty swallowing, lasting two to three days with intermittent relief. During these episodes, he notes normal heart rhythm but experiences concurrent numbness and vision loss. Numbness affects his legs, feet, left arm, and face, with his hands turning blue or white. Symptoms worsen with physical activity and heat exposure. Over a few hours during these episodes he becomes more pre-syncopal and eventually has syncope. He has some days where he does not feel bad but other days which he has these episodes. He used to exercise but cannot exercise any longer.  He manages  symptoms with high fluid intake and compression stockings. He monitors his heart rhythm using a personal device, noting blocked PACs and PVCs concurrent with his symptoms.  He was informed of a potential shunt between his heart chambers during an echocardiogram, though it was not included in the final report.        Review of systems complete and found to be negative unless listed in HPI.   EP Information / Studies Reviewed:    EKG is not ordered today. EKG from 03/06/24 reviewed which showed sinus rhythm        Risk Assessment/Calculations:           Physical Exam:   VS:  BP 118/70 (BP Location: Right Arm, Patient Position: Sitting, Cuff Size: Normal)   Pulse 84   Ht 5' 10 (1.778 m)   Wt 135 lb 4.8 oz (61.4 kg)   SpO2 98%   BMI 19.41 kg/m    Wt Readings from Last 3 Encounters:  04/01/24 135 lb 4.8 oz (61.4 kg)  03/06/24 130 lb (59 kg)  02/26/24 142 lb 10.2 oz (64.7 kg)     GEN: Well nourished, well developed in no acute distress NECK: No JVD; No carotid bruits CARDIAC: Regular rate and rhythm, no murmurs, rubs, gallops RESPIRATORY:  Clear to auscultation without rales, wheezing or rhonchi  ABDOMEN: Soft, non-tender, non-distended EXTREMITIES:  No edema; No deformity   ASSESSMENT AND PLAN:    1.  Syncope/presyncope 2.  Autonomic dysfunction 3.  PACs 4.  Small fiber neuropathy  Assessment and Plan    Autonomic small  fiber neuropathy with syncope and sensory symptoms Severe autonomic small fiber neuropathy confirmed by [facility] testing, presenting with persistent pain, numbness, and syncope. Potential causes include ischemia or vitamin deficiency. Symptoms inconsistent with POTS. Limited treatment options beyond pain management. Cardiologist to explore potential cardiac involvement. - Order treadmill stress test to evaluate cardiac response during exercise. - Review literature on autonomic small fiber neuropathy for potential cardiac implications.  Cardiac  arrhythmias (PACs, PVCs, blocked PACs) Rare PACs, PVCs, and blocked PACs concurrent with neuropathy symptoms. No evidence of AFib. Arrhythmias not believed to be the primary cause of syncope or sensory symptoms. Heart rhythm appears normal even during severe symptoms. - Monitor heart rhythm during treadmill stress test.  Patent foramen ovale (PFO) Potential PFO noted by echo technician, not confirmed in the report. No current evidence of hemodynamic issues or strokes related to PFO.  Electrolyte abnormalities (hypokalemia, hypocalcemia, hypomagnesemia) Recent ER visit showed hypokalemia, hypocalcemia, and hypomagnesemia, likely related to malabsorption from underlying GI condition. - Increase dietary intake of potassium and magnesium, such as consuming more bananas.        Follow up with EP Team pending treadmill results   Signed, Zonnie Landen Gladis Norton, MD

## 2024-04-01 NOTE — Patient Instructions (Addendum)
 Medication Instructions:  Your physician recommends that you continue on your current medications as directed. Please refer to the Current Medication list given to you today.  *If you need a refill on your cardiac medications before your next appointment, please call your pharmacy*  Lab Work: None ordered  If you have any lab test that is abnormal or we need to change your treatment, we will call you to review the results.  Testing/Procedures: Your physician has requested that you have an exercise tolerance test. For further information please visit [email or web address] Please also follow instruction sheet, as given.  Follow-Up: At [facility], you and your health needs are our priority.  As part of our continuing mission to provide you with exceptional heart care, our providers are all part of one team.  This team includes your primary Cardiologist (physician) and Advanced Practice Providers or APPs (Physician Assistants and Nurse Practitioners) who all work together to provide you with the care you need, when you need it.  Your next appointment:   To be determined  Provider:   You may see Dr. Inocencio or one of the following Advanced Practice Providers on your designated Care Team:   Charlies Arthur, PA-C Michael Andy Tillery, PA-C Daphne Barrack, NP  Thank you for choosing [facility]!!   Maeola Domino, RN [phone]   Other Instructions   April 01, 2024      LAYMOND POSTLE III DOB: [birth year] MRN: [id number] [place] [address]   Dear Mr. Ozment,   You are scheduled for an Exercise Stress Test on _________ at __________  Please arrive 15 minutes prior to your appointment time for registration and insurance purposes.  The test will take approximately 45 minutes to complete.  How to prepare for your Exercise Stress Test: Do bring a list of your current medications with you.  If not listed below, you may take your medications  as normal. Do wear comfortable clothes (no dresses or overalls) and walking shoes, tennis shoes preferred (no heels or open toed shoes are allowed) Do Not wear cologne, perfume, aftershave or lotions (deodorant is allowed). Please report to [address] for your test.  If these instructions are not followed, your test will have to be rescheduled.  If you have questions or concerns about your appointment, you can call the Stress Lab at [phone].  If you cannot keep your appointment, please provide 24 hours notification to the Stress Lab, to avoid a possible $50 charge to your account.

## 2024-04-05 ENCOUNTER — Encounter (HOSPITAL_COMMUNITY): Payer: Self-pay | Admitting: Gastroenterology

## 2024-04-05 NOTE — Progress Notes (Signed)
 Attempted to obtain medical history for pre op call via telephone, unable to reach at this time. HIPAA compliant voicemail message left requesting return call to pre surgical testing department.

## 2024-04-06 ENCOUNTER — Encounter (HOSPITAL_COMMUNITY)
Admission: RE | Disposition: A | Discharge status: Home / Self Care | Payer: Self-pay | Source: Home / Self Care | Attending: Gastroenterology

## 2024-04-06 ENCOUNTER — Other Ambulatory Visit: Payer: Self-pay

## 2024-04-06 ENCOUNTER — Encounter (HOSPITAL_COMMUNITY): Payer: Self-pay | Admitting: Gastroenterology

## 2024-04-06 ENCOUNTER — Ambulatory Visit (HOSPITAL_COMMUNITY)
Admission: RE | Admit: 2024-04-06 | Discharge: 2024-04-06 | Disposition: A | Discharge status: Home / Self Care | Attending: Gastroenterology | Admitting: Gastroenterology

## 2024-04-06 DIAGNOSIS — R634 Abnormal weight loss: Secondary | ICD-10-CM | POA: Insufficient documentation

## 2024-04-06 DIAGNOSIS — R1013 Epigastric pain: Secondary | ICD-10-CM | POA: Diagnosis not present

## 2024-04-06 DIAGNOSIS — K2950 Unspecified chronic gastritis without bleeding: Secondary | ICD-10-CM | POA: Diagnosis not present

## 2024-04-06 HISTORY — PX: ESOPHAGOGASTRODUODENOSCOPY: SHX5428

## 2024-04-06 SURGERY — EGD (ESOPHAGOGASTRODUODENOSCOPY)

## 2024-04-06 MED ORDER — BUTAMBEN-TETRACAINE-BENZOCAINE 2-2-14 % EX AERO
INHALATION_SPRAY | CUTANEOUS | Status: AC
  Filled 2024-04-06: qty 20

## 2024-04-06 MED ORDER — SODIUM CHLORIDE 0.9 % IV SOLN: INTRAVENOUS | Status: DC

## 2024-04-06 NOTE — Progress Notes (Addendum)
 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

## 2024-04-06 NOTE — Op Note (Signed)
 [facility] Patient Name: Manuel [name] Procedure Date: 04/06/2024 MRN: [id number] Attending MD: Oliva Boots , MD, [id number] Date of Birth: [birth year] CSN: [id number] Age: 43 Admit Type: Outpatient Procedure:                Upper GI endoscopy Indications:              Epigastric abdominal pain, Suspected gastritis                            history of collagenous gastritis, Weight loss Providers:                Oliva Boots, MD, Jacquelyn Jaci Pierce, RN, Corene Southgate, Technician Referring MD:              Medicines:                Cetacaine  spray only Complications:            No immediate complications. Estimated Blood Loss:     Estimated blood loss: none. Procedure:                Pre-Anesthesia Assessment:                           - Prior to the procedure, a History and Physical                            was performed, and patient medications and                            allergies were reviewed. The patient's tolerance of                            previous anesthesia was also reviewed. The risks                            and benefits of the procedure and the sedation                            options and risks were discussed with the patient.                            All questions were answered, and informed consent                            was obtained. Prior Anticoagulants: The patient has                            taken no anticoagulant or antiplatelet agents. ASA                            Grade Assessment: II - A patient with mild systemic  disease. After reviewing the risks and benefits,                            the patient was deemed in satisfactory condition to                            undergo the procedure.                           After obtaining informed consent, the endoscope was                            passed under direct vision. Throughout the                             procedure, the patient's blood pressure, pulse, and                            oxygen  saturations were monitored continuously. The                            [id number]-H190 ([id number]) Olympus endoscope was introduced                            through the mouth, and advanced to the third part                            of duodenum. The upper GI endoscopy was                            accomplished without difficulty. The patient                            tolerated the procedure well. Scope In: Scope Out: Findings:      The examined esophagus was normal.      Diffuse mild inflammation characterized by erythema and granularity was       found in the entire examined stomach. Biopsies were taken with a cold       forceps for histology. Some gastric contents was suctioned at the       beginning of the procedure for pH testing with results added to my epic       computer note as an addendum      The duodenal bulb, first portion of the duodenum, second portion of the       duodenum and third portion of the duodenum were normal. Biopsies were       taken with a cold forceps for histology.      The cardia and gastric fundus were normal on retroflexion. Impression:               - Normal esophagus.                           - Chronic gastritis. Biopsied.                           -  Normal duodenal bulb, first portion of the                            duodenum, second portion of the duodenum and third                            portion of the duodenum. Biopsied. Moderate Sedation:      Not Applicable - Patient had care per Anesthesia. Recommendation:           - Patient has a contact number available for                            emergencies. The signs and symptoms of potential                            delayed complications were discussed with the                            patient. Return to normal activities tomorrow.                            Written discharge instructions were provided to the                             patient.                           - Soft diet today.                           - Continue present medications. Start Entocort or                            budesonide  3 mg tablets 3 pills a day today for a                            total of 9 mg                           - Await pathology results.                           - Return to GI clinic in 4-6 weeks.                           - Telephone GI clinic for pathology results in 1                            week.                           - Telephone GI clinic if symptomatic PRN. Procedure Code(s):        --- Professional ---  56760, Esophagogastroduodenoscopy, flexible,                            transoral; with biopsy, single or multiple Diagnosis Code(s):        --- Professional ---                           K29.50, Unspecified chronic gastritis without                            bleeding                           R10.13, Epigastric pain                           R63.4, Abnormal weight loss CPT copyright 2022 American Medical Association. All rights reserved. The codes documented in this report are preliminary and upon coder review may  be revised to meet current compliance requirements. Oliva Boots, MD 04/06/2024 12:24:38 PM This report has been signed electronically. Number of Addenda: 0

## 2024-04-06 NOTE — Discharge Instructions (Addendum)
 Call if GI question or problem otherwise call for biopsy report in 1 week and restart Entocort 9mg  i.e. budesonide  3 pills a day today and follow-up in 4 to 6 weeks in the office but call sooner if need beYOU HAD AN ENDOSCOPIC PROCEDURE TODAY: Refer to the procedure report and other information in the discharge instructions given to you for any specific questions about what was found during the examination. If this information does not answer your questions, please call [facility] office at [phone] to clarify.   YOU SHOULD EXPECT: Some feelings of bloating in the abdomen. Passage of more gas than usual. Walking can help get rid of the air that was put into your GI tract during the procedure and reduce the bloating. If you had a lower endoscopy (such as a colonoscopy or flexible sigmoidoscopy) you may notice spotting of blood in your stool or on the toilet paper. Some abdominal soreness may be present for a day or two, also.  DIET: Your first meal following the procedure should be a light meal and then it is ok to progress to your normal diet. A half-sandwich or bowl of soup is an example of a good first meal. Heavy or fried foods are harder to digest and may make you feel nauseous or bloated. Drink plenty of fluids but you should avoid alcoholic beverages for 24 hours. If you had a esophageal dilation, please see attached instructions for diet.    ACTIVITY: Your care partner should take you home directly after the procedure. You should plan to take it easy, moving slowly for the rest of the day. You can resume normal activity the day after the procedure however YOU SHOULD NOT DRIVE, use power tools, machinery or perform tasks that involve climbing or major physical exertion for 24 hours (because of the sedation medicines used during the test).   SYMPTOMS TO REPORT IMMEDIATELY: A gastroenterologist can be reached at any hour. Please call [phone]  for any of the following symptoms:  Following lower  endoscopy (colonoscopy, flexible sigmoidoscopy) Excessive amounts of blood in the stool  Significant tenderness, worsening of abdominal pains  Swelling of the abdomen that is new, acute  Fever of 100 or higher  Following upper endoscopy (EGD, EUS, ERCP, esophageal dilation) Vomiting of blood or coffee ground material  New, significant abdominal pain  New, significant chest pain or pain under the shoulder blades  Painful or persistently difficult swallowing  New shortness of breath  Black, tarry-looking or red, bloody stools  FOLLOW UP:  If any biopsies were taken you will be contacted by phone or by letter within the next 1-3 weeks. Call [phone]  if you have not heard about the biopsies in 3 weeks.  Please also call with any specific questions about appointments or follow up tests.

## 2024-04-08 ENCOUNTER — Encounter (HOSPITAL_COMMUNITY): Payer: Self-pay | Admitting: Gastroenterology

## 2024-04-09 LAB — SURGICAL PATHOLOGY

## 2024-04-12 ENCOUNTER — Other Ambulatory Visit: Payer: Self-pay | Admitting: Cardiology

## 2024-04-12 ENCOUNTER — Encounter (HOSPITAL_COMMUNITY): Payer: Self-pay

## 2024-04-12 ENCOUNTER — Ambulatory Visit (HOSPITAL_COMMUNITY)
Admission: RE | Admit: 2024-04-12 | Discharge: 2024-04-12 | Disposition: A | Discharge status: Home / Self Care | Source: Ambulatory Visit | Attending: Cardiology | Admitting: Cardiology

## 2024-04-12 DIAGNOSIS — Q2112 Patent foramen ovale: Secondary | ICD-10-CM

## 2024-04-12 LAB — ECHOCARDIOGRAM LIMITED BUBBLE STUDY: S' Lateral: 2.05 cm

## 2024-04-13 DIAGNOSIS — H43393 Other vitreous opacities, bilateral: Secondary | ICD-10-CM | POA: Diagnosis not present

## 2024-04-13 DIAGNOSIS — H53149 Visual discomfort, unspecified: Secondary | ICD-10-CM | POA: Diagnosis not present

## 2024-04-13 DIAGNOSIS — H53459 Other localized visual field defect, unspecified eye: Secondary | ICD-10-CM | POA: Diagnosis not present

## 2024-04-13 DIAGNOSIS — R519 Headache, unspecified: Secondary | ICD-10-CM | POA: Diagnosis not present

## 2024-04-14 ENCOUNTER — Ambulatory Visit (HOSPITAL_COMMUNITY): Admission: RE | Admit: 2024-04-14 | Source: Ambulatory Visit | Attending: Cardiology

## 2024-04-14 ENCOUNTER — Ambulatory Visit: Payer: Self-pay | Admitting: Cardiology

## 2024-04-14 ENCOUNTER — Telehealth (HOSPITAL_COMMUNITY): Payer: Self-pay | Admitting: Cardiology

## 2024-04-14 NOTE — Telephone Encounter (Signed)
 Will send this message to Dr. Inocencio and RN as a general FYI from echo scheduler.

## 2024-04-14 NOTE — Telephone Encounter (Signed)
 Patient cancelled GXT and does not wish to reschedule. See below:  04/14/2024 9:36 AM Manuel [name], Manuel [name]  Cancel Rsn: Patient (Wishes to cancel ETT for now.   Order will be removed from the ECHO WQ. Thank you.

## 2024-04-16 NOTE — Telephone Encounter (Signed)
 Patient calling to speak to a nurse regarding information he received from the pharmacy regarding this medication  rifAXIMin (XIFAXAN) 550 mg tablet   Best number to reach him is [phone]

## 2024-04-16 NOTE — Telephone Encounter (Signed)
 Notified pt that we were awaiting the approval. Pt voiced understanding.

## 2024-04-16 NOTE — Telephone Encounter (Signed)
 PA Status  Pending  Medication Xifaxan 550mg    Provider [employer] Dora  Method  Key/Ref# covermymeds  [id number]

## 2024-04-16 NOTE — Progress Notes (Unsigned)
  Cardiology Office Note:   Date:  04/19/2024  ID:  Manuel [name], DOB [birth year], MRN [id number] PCP: [name], MD  [facility] Providers Cardiologist:  Lynwood Schilling, MD Electrophysiologist:  Elspeth Sage, MD {  History of Present Illness:   Manuel [name] is a 43 y.o. male who had previously seen Dr. Inocencio for palpitations and LOC.  He was admitted June 2025 with syncope.  He has autonomic dysfunction previously diagnosed at [facility].  He has PVCs and PACs and a PFO.   He was to have a GXT after seeing Dr. Inocencio but he canceled this.    He previously saw Dr Sage.  I saw him in [facility] in June for syncope.    He has had a diagnosis of small fiber neuropathy by biopsy that was sent to [facility].  He has seen dermatology, rheumatology, GI, neurology in [place].  He was treated for possible dysautonomia for years but does not think really that was his issue.  He had testicular cancer.  He continues to go downhill.  He is very symptomatic and says he has just progressive exercise intolerance.  He has numbness and tingling in cold sensation in his hands.  He has dyspnea on exertion.  He has lost progressive weight.  He is losing peripheral vision and is actually seeing ophthalmology who thought he had some ischemic changes and has further testing planned.  He did not think he could do a treadmill test.  He describes orthopnea and shows me oxygen  saturations are markedly different lying sitting and standing.  Of note his echocardiogram does demonstrate a PFO.     ROS: As stated in the HPI and negative for all other systems.  Studies Reviewed:    EKG:     03/21/2024 sinus rhythm, rate 90, axis within normal limits, intervals within normal limits, no acute ST-T wave changes.    Risk Assessment/Calculations:              Physical Exam:   VS:  BP 116/70   Pulse (!) 106   Ht 5' 10 (1.778 m)   Wt 131 lb (59.4 kg)   SpO2 98%   BMI 18.80 kg/m    Wt Readings from Last 3  Encounters:  04/19/24 131 lb (59.4 kg)  04/06/24 130 lb (59 kg)  04/01/24 135 lb 4.8 oz (61.4 kg)     GEN: Well nourished, well developed in no acute distress NECK: No JVD; No carotid bruits CARDIAC: RRR, no murmurs, rubs, gallops RESPIRATORY:  Clear to auscultation without rales, wheezing or rhonchi  ABDOMEN: Soft, non-tender, non-distended EXTREMITIES:  No edema; No deformity   ASSESSMENT AND PLAN:   PFO: The patient has a constellation of symptoms as described above.  He has been told by his ophthalmologist as he has some ischemic retinal changes.  He does have a PFO.  He has shortness of breath.  He has documented fall oxygen  saturations.  I would not discuss right heart catheterization with shunt run and plan for this.     Labile BPs: Blood pressures are labile and he has done poorly on certain therapies.  He actually thinks he is doing relatively well compared to previous history.  No change in therapy at this point.     Follow up with me in 3 months.   Signed, Lynwood Schilling, MD

## 2024-04-16 NOTE — H&P (View-Only) (Signed)
  Cardiology Office Note:   Date:  04/19/2024  ID:  Manuel [name], DOB 1981/06/11, MRN [id number] PCP: Manuel Lamar POUR, MD  [facility] Providers Cardiologist:  Lynwood Schilling, MD Electrophysiologist:  Elspeth Sage, MD {  History of Present Illness:   Manuel [name] is a 43 y.o. male who had previously seen Dr. Inocencio for palpitations and LOC.  He was admitted June 2025 with syncope.  He has autonomic dysfunction previously diagnosed at [facility].  He has PVCs and PACs and a PFO.   He was to have a GXT after seeing Dr. Inocencio but he canceled this.    He previously saw Dr Sage.  I saw him in [facility] in June for syncope.    He has had a diagnosis of small fiber neuropathy by biopsy that was sent to [facility].  He has seen dermatology, rheumatology, GI, neurology in [place].  He was treated for possible dysautonomia for years but does not think really that was his issue.  He had testicular cancer.  He continues to go downhill.  He is very symptomatic and says he has just progressive exercise intolerance.  He has numbness and tingling in cold sensation in his hands.  He has dyspnea on exertion.  He has lost progressive weight.  He is losing peripheral vision and is actually seeing ophthalmology who thought he had some ischemic changes and has further testing planned.  He did not think he could do a treadmill test.  He describes orthopnea and shows me oxygen  saturations are markedly different lying sitting and standing.  Of note his echocardiogram does demonstrate a PFO.     ROS: As stated in the HPI and negative for all other systems.  Studies Reviewed:    EKG:     03/21/2024 sinus rhythm, rate 90, axis within normal limits, intervals within normal limits, no acute ST-T wave changes.    Risk Assessment/Calculations:              Physical Exam:   VS:  BP 116/70   Pulse (!) 106   Ht 5' 10 (1.778 m)   Wt 131 lb (59.4 kg)   SpO2 98%   BMI 18.80 kg/m    Wt Readings from Last 3  Encounters:  04/19/24 131 lb (59.4 kg)  04/06/24 130 lb (59 kg)  04/01/24 135 lb 4.8 oz (61.4 kg)     GEN: Well nourished, well developed in no acute distress NECK: No JVD; No carotid bruits CARDIAC: RRR, no murmurs, rubs, gallops RESPIRATORY:  Clear to auscultation without rales, wheezing or rhonchi  ABDOMEN: Soft, non-tender, non-distended EXTREMITIES:  No edema; No deformity   ASSESSMENT AND PLAN:   PFO: The patient has a constellation of symptoms as described above.  He has been told by his ophthalmologist as he has some ischemic retinal changes.  He does have a PFO.  He has shortness of breath.  He has documented fall oxygen  saturations.  I would not discuss right heart catheterization with shunt run and plan for this.     Labile BPs: Blood pressures are labile and he has done poorly on certain therapies.  He actually thinks he is doing relatively well compared to previous history.  No change in therapy at this point.     Follow up with me in 3 months.   Signed, Lynwood Schilling, MD

## 2024-04-16 NOTE — Telephone Encounter (Signed)
 Have we gotten a response on this pending PA? Pt is very anxious to get started.

## 2024-04-19 ENCOUNTER — Ambulatory Visit: Attending: Cardiology | Admitting: Cardiology

## 2024-04-19 ENCOUNTER — Encounter: Payer: Self-pay | Admitting: Cardiology

## 2024-04-19 VITALS — BP 116/70 | HR 106 | Ht 70.0 in | Wt 131.0 lb

## 2024-04-19 DIAGNOSIS — R0602 Shortness of breath: Secondary | ICD-10-CM | POA: Diagnosis not present

## 2024-04-19 DIAGNOSIS — R531 Weakness: Secondary | ICD-10-CM

## 2024-04-19 NOTE — Patient Instructions (Signed)
 Medication Instructions:  Your physician recommends that you continue on your current medications as directed. Please refer to the Current Medication list given to you today.  *If you need a refill on your cardiac medications before your next appointment, please call your pharmacy*  Lab Work: NONE If you have labs (blood work) drawn today and your tests are completely normal, you will receive your results only by: MyChart Message (if you have MyChart) OR A paper copy in the mail If you have any lab test that is abnormal or we need to change your treatment, we will call you to review the results.  Testing/Procedures: NONE  Follow-Up: At [facility], you and your health needs are our priority.  As part of our continuing mission to provide you with exceptional heart care, our providers are all part of one team.  This team includes your primary Cardiologist (physician) and Advanced Practice Providers or APPs (Physician Assistants and Nurse Practitioners) who all work together to provide you with the care you need, when you need it.  Your next appointment:   3 months  Provider:   Lavona, MD  We recommend signing up for the patient portal called MyChart.  Sign up information is provided on this After Visit Summary.  MyChart is used to connect with patients for Virtual Visits (Telemedicine).  Patients are able to view lab/test results, encounter notes, upcoming appointments, etc.  Non-urgent messages can be sent to your provider as well.   To learn more about what you can do with MyChart, go to [email or web address].

## 2024-04-20 DIAGNOSIS — K2970 Gastritis, unspecified, without bleeding: Secondary | ICD-10-CM | POA: Diagnosis not present

## 2024-04-20 DIAGNOSIS — K2960 Other gastritis without bleeding: Secondary | ICD-10-CM | POA: Diagnosis not present

## 2024-04-20 DIAGNOSIS — B9681 Helicobacter pylori [H. pylori] as the cause of diseases classified elsewhere: Secondary | ICD-10-CM | POA: Diagnosis not present

## 2024-04-20 NOTE — Telephone Encounter (Signed)
 We could start the appeal and then we can discuss further in clinic today

## 2024-04-22 ENCOUNTER — Telehealth: Payer: Self-pay

## 2024-04-22 DIAGNOSIS — Z01812 Encounter for preprocedural laboratory examination: Secondary | ICD-10-CM

## 2024-04-22 NOTE — Telephone Encounter (Signed)
 Labs ordered and released for cath. Pt aware of labs to be drawn prior to procedure. Pt verbalized understanding. All questions if any were answered.

## 2024-04-22 NOTE — Telephone Encounter (Signed)
 Spoke with pt regarding a right heart cath. Pt scheduled 9/3 with Dr. Rolan and contrast will not be used. Pt aware. Pt would like a call from Dr. Lavona to review the risks of the procedure once more. Dr. Lavona has been notified. Pt instructions will be sent via MyChart per pt's request. Pt verbalized understanding. All questions if any were answered.

## 2024-04-22 NOTE — Telephone Encounter (Signed)
-----   Message from Lynwood Schilling sent at 04/20/2024  3:35 PM EDT ----- Hi,  Can you schedule a right heart cath only on this patient.   This would be with Dr. Rolan. Tell the patient we will not be using contrast.  Thanks.

## 2024-04-22 NOTE — Addendum Note (Signed)
 Addended by: Barbie Croston N on: 04/22/2024 01:52 PM   Modules accepted: Orders

## 2024-04-26 ENCOUNTER — Encounter (HOSPITAL_BASED_OUTPATIENT_CLINIC_OR_DEPARTMENT_OTHER): Payer: Self-pay

## 2024-04-26 ENCOUNTER — Emergency Department (HOSPITAL_COMMUNITY)

## 2024-04-26 ENCOUNTER — Observation Stay (HOSPITAL_BASED_OUTPATIENT_CLINIC_OR_DEPARTMENT_OTHER)
Admission: EM | Admit: 2024-04-26 | Discharge: 2024-04-27 | Disposition: A | Discharge status: Home / Self Care | Attending: Internal Medicine | Admitting: Internal Medicine

## 2024-04-26 ENCOUNTER — Emergency Department (HOSPITAL_BASED_OUTPATIENT_CLINIC_OR_DEPARTMENT_OTHER)

## 2024-04-26 DIAGNOSIS — R29818 Other symptoms and signs involving the nervous system: Principal | ICD-10-CM | POA: Insufficient documentation

## 2024-04-26 DIAGNOSIS — R55 Syncope and collapse: Secondary | ICD-10-CM | POA: Diagnosis not present

## 2024-04-26 DIAGNOSIS — R2990 Unspecified symptoms and signs involving the nervous system: Principal | ICD-10-CM | POA: Diagnosis present

## 2024-04-26 DIAGNOSIS — G608 Other hereditary and idiopathic neuropathies: Secondary | ICD-10-CM | POA: Diagnosis not present

## 2024-04-26 DIAGNOSIS — K2960 Other gastritis without bleeding: Secondary | ICD-10-CM | POA: Diagnosis present

## 2024-04-26 DIAGNOSIS — R930 Abnormal findings on diagnostic imaging of skull and head, not elsewhere classified: Secondary | ICD-10-CM | POA: Diagnosis not present

## 2024-04-26 DIAGNOSIS — G90A Postural orthostatic tachycardia syndrome (POTS): Secondary | ICD-10-CM | POA: Diagnosis present

## 2024-04-26 DIAGNOSIS — Q2112 Patent foramen ovale: Secondary | ICD-10-CM | POA: Diagnosis not present

## 2024-04-26 DIAGNOSIS — Z8547 Personal history of malignant neoplasm of testis: Secondary | ICD-10-CM | POA: Diagnosis not present

## 2024-04-26 DIAGNOSIS — G629 Polyneuropathy, unspecified: Secondary | ICD-10-CM

## 2024-04-26 DIAGNOSIS — K2970 Gastritis, unspecified, without bleeding: Secondary | ICD-10-CM | POA: Diagnosis not present

## 2024-04-26 DIAGNOSIS — R202 Paresthesia of skin: Secondary | ICD-10-CM | POA: Diagnosis not present

## 2024-04-26 LAB — COMPREHENSIVE METABOLIC PANEL WITH GFR
ALT: 15 U/L (ref 0–44)
AST: 17 U/L (ref 15–41)
Albumin: 4.8 g/dL (ref 3.5–5.0)
Alkaline Phosphatase: 81 U/L (ref 38–126)
Anion gap: 14 (ref 5–15)
BUN: 16 mg/dL (ref 6–20)
CO2: 23 mmol/L (ref 22–32)
Calcium: 10.2 mg/dL (ref 8.9–10.3)
Chloride: 101 mmol/L (ref 98–111)
Creatinine, Ser: 1 mg/dL (ref 0.61–1.24)
GFR, Estimated: >60 mL/min (ref >60)
Glucose, Bld: 105 mg/dL — ABNORMAL HIGH (ref 70–99)
Potassium: 3.7 mmol/L (ref 3.5–5.1)
Sodium: 137 mmol/L (ref 135–145)
Total Bilirubin: 1.1 mg/dL (ref 0.0–1.2)
Total Protein: 7.6 g/dL (ref 6.5–8.1)

## 2024-04-26 LAB — DIFFERENTIAL
Abs Immature Granulocytes: 0.02 K/uL (ref 0.00–0.07)
Basophils Absolute: 0.1 K/uL (ref 0.0–0.1)
Basophils Relative: 1 %
Eosinophils Absolute: 0.3 K/uL (ref 0.0–0.5)
Eosinophils Relative: 3 %
Immature Granulocytes: 0 %
Lymphocytes Relative: 31 %
Lymphs Abs: 2.6 K/uL (ref 0.7–4.0)
Monocytes Absolute: 0.5 K/uL (ref 0.1–1.0)
Monocytes Relative: 6 %
Neutro Abs: 5.1 K/uL (ref 1.7–7.7)
Neutrophils Relative %: 59 %

## 2024-04-26 LAB — CBC
HCT: 42 % (ref 39.0–52.0)
Hemoglobin: 14.4 g/dL (ref 13.0–17.0)
MCH: 29.8 pg (ref 26.0–34.0)
MCHC: 34.3 g/dL (ref 30.0–36.0)
MCV: 86.8 fL (ref 80.0–100.0)
Platelets: 299 K/uL (ref 150–400)
RBC: 4.84 MIL/uL (ref 4.22–5.81)
RDW: 12.3 % (ref 11.5–15.5)
WBC: 8.6 K/uL (ref 4.0–10.5)
nRBC: 0 % (ref 0.0–0.2)

## 2024-04-26 LAB — ETHANOL: Alcohol, Ethyl (B): <15 mg/dL (ref <15)

## 2024-04-26 LAB — PROTIME-INR
INR: 1 (ref 0.8–1.2)
Prothrombin Time: 14 s (ref 11.4–15.2)

## 2024-04-26 LAB — CBG MONITORING, ED: Glucose-Capillary: 103 mg/dL — ABNORMAL HIGH (ref 70–99)

## 2024-04-26 LAB — APTT: aPTT: 26 s (ref 24–36)

## 2024-04-26 MED ORDER — LORAZEPAM 2 MG/ML IJ SOLN: 1.0000 mg | INTRAMUSCULAR | Status: DC | PRN

## 2024-04-26 MED ORDER — LORAZEPAM 1 MG PO TABS: 1.0000 mg | ORAL_TABLET | ORAL | Status: DC | PRN

## 2024-04-26 MED ORDER — SODIUM CHLORIDE 0.9% FLUSH: 3.0000 mL | Freq: Once | INTRAVENOUS | Status: DC

## 2024-04-26 NOTE — ED Notes (Signed)
 Pt arrives from DB for DB for MRI. No changes in route from DB. VSS, Pt has no complaints at this time.

## 2024-04-26 NOTE — ED Notes (Signed)
 Notified by Curtistine RN needing to call code stroke for patient in triage. Tele neuro cart activated and CT called for scan availability.

## 2024-04-26 NOTE — ED Notes (Addendum)
 To MRI, indicated that no medication was needed

## 2024-04-26 NOTE — ED Provider Notes (Signed)
 [place] EMERGENCY DEPARTMENT AT [facility] Provider Note   CSN: [id number] Arrival date & time: 04/26/24  8061  An emergency department physician performed an initial assessment on this suspected stroke patient at 70.  Patient presents with: Numbness, Headache, and Altered Mental Status   Manuel [name] is a 43 y.o. male.    Headache Altered Mental Status Associated symptoms: headaches      43 year old male with medical history significant for testicular cancer, migraine headaches, seizures, POTS, recent echocardiogram which showed a patent foramen ovale on echocardiogram 04/12/2024, history of gastritis, Mallory-Weiss tear, history of small fiber neuropathy, follows with Dr. Nelida of neurology at [facility] health who presents to the emergency department with concern for stroke.  The patient states that he had new onset right-sided face and arm numbness that started roughly 2 hours prior to arrival.  He has been feeling confused.  He endorses numbness of the right upper extremity, right lower extremity, right face.  He had endorses headache in triage but denies headache now.  He endorses decreased grip strength in the right hand as well.  No facial droop, no difficulty swallowing or speaking.  Given the patient's symptoms, on arrival code stroke was activated and teleneurology was consulted.  Prior to Admission medications   Medication Sig Start Date End Date Taking? Authorizing Provider  albuterol  (VENTOLIN  HFA) 108 (90 Base) MCG/ACT inhaler Inhale 2 puffs into the lungs every 6 (six) hours as needed for shortness of breath or wheezing. 09/23/23 09/22/24  [provider]  Ascorbic Acid  (VITAMIN C) 125 MG CHEW Chew 125 mg by mouth daily at 12 noon.    [provider]  Biotin 5000 MCG CAPS Take 5,000 mcg by mouth daily at 12 noon.    [provider]  budesonide  (ENTOCORT EC ) 3 MG 24 hr capsule Take 9 mg by mouth daily.    [provider]   Cholecalciferol (VITAMIN D3 MAXIMUM STRENGTH) 125 MCG (5000 UT) capsule Take 5,000 Units by mouth daily.    [provider]  Ferrous Sulfate (BL IRON PO) Take 25 mg by mouth daily at 12 noon.    [provider]  folic acid  (FOLVITE ) 400 MCG tablet Take 400 mcg by mouth daily.    [provider]  vitamin E 180 MG (400 UNITS) capsule Take 400 Units by mouth daily. Gamma 100 mg - Alpha    [provider]  vitamin k 100 MCG tablet Take 100 mcg by mouth daily.    [provider]    Allergies: Codeine, Erythromycin, Iodine, Ioversol, Shellfish allergy, Azithromycin, Corn-containing products, Wheat, Dilaudid [hydromorphone hcl], Nsaids, Prednisone , and Tetracyclines & related    Review of Systems  Neurological:  Positive for headaches.  All other systems reviewed and are negative.   Updated Vital Signs BP 117/72   Pulse 79   Temp 98.6 F (37 C)   Resp 16   Ht 5' 11 (1.803 m)   SpO2 98%   BMI 18.27 kg/m   Physical Exam  (all labs ordered are listed, but only abnormal results are displayed) Labs Reviewed  COMPREHENSIVE METABOLIC PANEL WITH GFR - Abnormal; Notable for the following components:      Result Value   Glucose, Bld 105 (*)    All other components within normal limits  CBG MONITORING, ED - Abnormal; Notable for the following components:   Glucose-Capillary 103 (*)    All other components within normal limits  PROTIME-INR  APTT  CBC  DIFFERENTIAL  ETHANOL  CBG MONITORING, ED    EKG: None  Radiology: CT HEAD CODE STROKE WO CONTRAST Result Date: 04/26/2024 CLINICAL DATA:  Code stroke. EXAM: CT HEAD WITHOUT CONTRAST TECHNIQUE: Contiguous axial images were obtained from the base of the skull through the vertex without intravenous contrast. RADIATION DOSE REDUCTION: This exam was performed according to the departmental dose-optimization program which includes automated exposure control, adjustment of the mA and/or kV according  to patient size and/or use of iterative reconstruction technique. COMPARISON:  None Available. FINDINGS: Brain: Cerebral volume within normal limits for patient age. No acute intracranial hemorrhage. No acute large vessel territory infarct. No mass lesion, midline shift, or mass effect. Ventricles are normal in size without hydrocephalus. No extra-axial fluid collection. Vascular: No abnormal hyperdense vessel. Skull: Scalp soft tissues demonstrate no acute abnormality. Calvarium intact. Sinuses/Orbits: Globes and orbital soft tissues within normal limits. Visualized paranasal sinuses are largely clear. No significant mastoid effusion. ASPECTS [facility] Stroke Program Early CT Score) - Ganglionic level infarction (caudate, lentiform nuclei, internal capsule, insula, M1-M3 cortex): 7 - Supraganglionic infarction (M4-M6 cortex): 3 Total score (0-10 with 10 being normal): 10 IMPRESSION: 1. Normal head CT.  No acute intracranial abnormality. 2. ASPECTS is 10. Results were called by telephone at the time of interpretation on 04/26/2024 at 8:12 pm to provider [facility] , who verbally acknowledged these results. Electronically Signed   By: Morene Hoard M.D.   On: 04/26/2024 20:12     Procedures   Medications Ordered in the ED  sodium chloride  flush (NS) 0.9 % injection 3 mL (has no administration in time range)  LORazepam  (ATIVAN ) tablet 1 mg (has no administration in time range)                                    Medical Decision Making Amount and/or Complexity of Data Reviewed Labs: ordered. Radiology: ordered.  Risk Prescription drug management.    43 year old male with medical history significant for testicular cancer, migraine headaches, seizures, POTS, recent echocardiogram which showed a patent foramen ovale on echocardiogram 04/12/2024, history of gastritis, Mallory-Weiss tear, history of small fiber neuropathy, follows with Dr. Nelida of neurology at [facility] health who presents to  the emergency department with concern for stroke.  The patient states that he had new onset right-sided face and arm numbness that started roughly 2 hours prior to arrival.  He has been feeling confused.  He endorses numbness of the right upper extremity, right lower extremity, right face.  He had endorses headache in triage but denies headache now.  He endorses decreased grip strength in the right hand as well.  No facial droop, no difficulty swallowing or speaking.  Given the patient's symptoms, on arrival code stroke was activated and teleneurology was consulted.  On arrival, the patient was vitally stable, afebrile, heart rate 100, BP 103/81, saturating her percent on room air.  On my exam, the patient had decreased sensation to light touch in the right face, right hemibody.  Some decreased grip strength on the right.  CBG 103 on arrival.  Patient initially complained of headache, this had resolved post CT imaging.  CT code stroke: Negative for acute abnormalities.  Labs: Generally unremarkable.  Discussed with neurology in consultation: Recommendations include MRI of the brain without contrast, MR angio head with and without contrast as the patient has a reported contrast allergy to IV contrast.  Patient did not  meet criteria for TNK per neurology.  Recommendations include MRI imaging, Dr. Darra accepted the patient and the ER to ER transfer for emergent MRI.     Final diagnoses:  Stroke-like symptoms    ED Discharge Orders     None          Jerrol Agent, MD 04/26/24 2149

## 2024-04-26 NOTE — Consult Note (Signed)
 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 Stroke Scale is 2 for right leg drift and sensory loss. Right grip is weaker, but no drift of right arm. After a very long discussion with the patient and his wife Alan at bedside, the decision was made to defer on TNK. First, there is some uncertainty over the exact last normal time. Secondly, he endorses that he had a recent endoscopy and was told he has active gastritis and inflammation. He has noted some small amount of red blood in his vomit within the last 21 days. For these reasons, he is not an ideal thrombolytic candidate due to concern for adverse GI bleeding. Head CT is negative for acute abnormalities. He also reports an anaphylactic reaction to iodine. Given his low NIHSS of 2 and no signs concerning for LVO, it is reasonable to pursue a stat MRI brain and MRA head/neck to evaluate for stroke and any vascular abnormality.    PLAN  - MRI not available at this facility; patient will be transferred to [facility] main facility for stat MRI brain w/o contrast, and MRA head/neck w/ and w/o contrast  - in house Neuro consultation at accepting facility for stroke admission and workup  ---  Our recommendations are outlined below.  Recommendations:       Stroke/Telemetry Floor       Neuro Checks       Bedside Swallow Eval       DVT Prophylaxis       IV Fluids, Normal Saline       Head of Bed 30 Degrees       Euglycemia and Avoid Hyperthermia (PRN Acetaminophen )  Sign Out:       Discussed with  Emergency Department Provider    ------------------------------------------------------------------------------  Advanced Imaging: Advanced Imaging Deferred because:  Advanced Imaging not obtained at this time. Reason: will get stat MRI/MRA instead due to anaphylaxis to iodine   Metrics: Last Known Well: 04/26/2024 16:00:00 Dispatch Time: 04/26/2024 19:50:36 Arrival Time: 04/26/2024 19:38:00 Initial Response Time: 04/26/2024 19:54:48 Symptoms: right sided weakness and numbness. Initial patient interaction: 04/26/2024 20:01:48 NIHSS Assessment Completed: 04/26/2024 20:12:54 Patient is not a candidate for Thrombolytic. Thrombolytic Medical Decision: 04/26/2024 20:28:01 Patient was not deemed candidate for Thrombolytic because of following reasons: Recent gastrointestinal or urinary tract hemorrhage (within previous 21 days) .  CT Head: CT head unremarkable for acute infarction or hemorrhage per Radiology: no acute intracranial abnormality  Primary Provider Notified of Diagnostic Impression and Management Plan on: 04/26/2024 20:46:52    ------------------------------------------------------------------------------  History of Present Illness: Patient is a 43 year old Male.  Patient was brought by private transportation with symptoms of right sided weakness and numbness. 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 Patient is unclear regarding his exact last normal time. He initially reported 6:00 PM,  then revised to 4:00 PM, only to revert back to 6:00 PM. He says he started feeling not well around 4:00 PM but it wasn't until 6:00 PM when he noted onset of numbness and weakness of his right side. He reported tingling that traveled from his right face down to his  arm. This progressed to weakness and difficulty using his right side. He also reports feeling very confused, is having difficulty focusing and answering questions clearly. Here in the ER his Stroke Scale is 2. Exam is notable for right sensory loss as well as right leg drift. He has no drift in his right arm, but his right hand grip is weaker than the left. He has no evidence of any aphasia, however he does pause intermittently when he answers questions and at times appears distracted. He endorses a low grade headache.   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  Past Surgical History: There Is No Surgical History Contributory To Today's Visit    Examination: 1A: Level of Consciousness - Alert; keenly responsive + 0 1B: Ask Month and Age - Both Questions Right + 0 1C: Blink Eyes & Squeeze Hands - Performs Both Tasks + 0 2: Test Horizontal Extraocular Movements - Normal + 0 3: Test Visual Fields - No Visual Loss + 0 4: Test Facial Palsy (Use Grimace if Obtunded) - Normal symmetry + 0 5A: Test Left Arm Motor Drift - No Drift for 10 Seconds + 0 5B: Test Right Arm Motor Drift - No Drift for 10 Seconds + 0 6A: Test Left Leg Motor Drift - No Drift for 5 Seconds + 0 6B: Test Right Leg Motor Drift - Drift, but doesn't hit bed + 1 7: Test Limb Ataxia (FNF/Heel-Shin) - No Ataxia + 0 8: Test Sensation - Mild-Moderate Loss: Less Sharp/More Dull + 1 9: Test Language/Aphasia - Normal; No aphasia + 0 10: Test Dysarthria - Normal +  0 11: Test Extinction/Inattention - No abnormality + 0  NIHSS Score: 2   Pre-Morbid Modified Rankin Scale: 0 Points = No symptoms at all  Spoke with : er md I reviewed the available imaging via [facility] and initiated discussion with the primary provider  This consult was conducted in real time using interactive audio and Immunologist. Patient was informed of the technology being used for this visit and agreed to proceed. Patient located in hospital and provider located at home/office setting.   Patient is being evaluated for possible acute neurologic impairment and high probability of imminent or life-threatening deterioration. I spent total of 40 minutes providing care to this patient, including time for face to face visit via telemedicine, review of medical records, imaging studies and discussion of findings with providers, the patient and/or family.    Dr Ana Cowing   [facility] For Inpatient follow-up with [facility] physician please call [facility] at [phone]. As we are not an outpatient service for any post hospital discharge needs please contact the hospital for assistance. If you have any questions for the [facility] physicians or need to reconsult for clinical or  diagnostic changes please contact us  via [facility] at [phone].   Signature : Tanija Germani

## 2024-04-26 NOTE — ED Notes (Signed)
 ED Provider at bedside.

## 2024-04-26 NOTE — ED Triage Notes (Addendum)
 Spouse reports that two hours ago patient called her and was having increased confusion. Patient reports numbness RUE, LLE, and RLE, and headache with light sensitivity.

## 2024-04-26 NOTE — ED Notes (Signed)
 [facility] at bedside

## 2024-04-27 ENCOUNTER — Encounter (HOSPITAL_COMMUNITY)

## 2024-04-27 ENCOUNTER — Emergency Department (HOSPITAL_COMMUNITY)

## 2024-04-27 DIAGNOSIS — Q2112 Patent foramen ovale: Secondary | ICD-10-CM

## 2024-04-27 DIAGNOSIS — K2960 Other gastritis without bleeding: Secondary | ICD-10-CM | POA: Diagnosis not present

## 2024-04-27 DIAGNOSIS — G629 Polyneuropathy, unspecified: Secondary | ICD-10-CM

## 2024-04-27 DIAGNOSIS — R55 Syncope and collapse: Secondary | ICD-10-CM | POA: Diagnosis not present

## 2024-04-27 DIAGNOSIS — G90A Postural orthostatic tachycardia syndrome (POTS): Secondary | ICD-10-CM

## 2024-04-27 DIAGNOSIS — R2990 Unspecified symptoms and signs involving the nervous system: Principal | ICD-10-CM | POA: Diagnosis present

## 2024-04-27 DIAGNOSIS — Z8547 Personal history of malignant neoplasm of testis: Secondary | ICD-10-CM

## 2024-04-27 LAB — RAPID URINE DRUG SCREEN, HOSP PERFORMED
Amphetamines: NOT DETECTED
Barbiturates: NOT DETECTED
Benzodiazepines: NOT DETECTED
Cocaine: NOT DETECTED
Opiates: NOT DETECTED
Tetrahydrocannabinol: NOT DETECTED

## 2024-04-27 MED ORDER — ALBUTEROL SULFATE (2.5 MG/3ML) 0.083% IN NEBU: 2.5000 mg | INHALATION_SOLUTION | Freq: Four times a day (QID) | RESPIRATORY_TRACT | Status: DC | PRN

## 2024-04-27 MED ORDER — SODIUM CHLORIDE 0.9% FLUSH: 3.0000 mL | Freq: Two times a day (BID) | INTRAVENOUS | Status: DC

## 2024-04-27 NOTE — H&P (Addendum)
 History and Physical    Patient: Manuel [name] FMW:[id number] DOB: 11-06-1980 DOA: 04/26/2024 DOS: the patient was seen and examined on 04/27/2024 PCP: Beam, Lamar POUR, MD  Patient coming from: Transferred from [facility]  Chief Complaint:  Chief Complaint  Patient presents with   Numbness   Headache   Altered Mental Status   HPI: Manuel [name] is a 43 y.o. male with medical history significant of POTS, collagenous gastritis, migraine headaches, seizure disorder, testicular cancer in remission, viral meningitis, small fiber neuropathy, nutrient deficiency, and history of testicular cancer in remission presents with complaints with right-sided numbness, confusion, and visual disturbances at around 4 PM yesterday.  The patient experienced a sudden onset of numbness on the right side of his body, including the right foot, arm, and face, along with confusion, inability to think or speak clearly, and visual disturbances in both eyes. These symptoms persisted for most of the night, but he feels much better today, although numbness in the right foot and face persists. No headache was present during the episode, although a slight headache occurred the afternoon before symptoms began.  He has a history of a patent foramen ovale (PFO) diagnosed by his cardiologist, with a recent bubble study that the patient reports showed a two-centimeter shunt. He is scheduled for a right heart catheterization next week. He reports episodes of blood oxygen  saturation dropping into the seventies, which can improve with positional changes.  He has experienced similar episodes of numbness and confusion since childhood, but the current episode is the most severe. Previous episodes have affected different areas, including the left side of his face, and have not resulted in long-term effects until last month.  He still reports having some the left side of his face that has persisted.  He has a history of  autonomic small fiber neuropathy and has been evaluated with a Holter monitor, which showed PACs and PVCs but no significant arrhythmias.  He has a history of nutrient deficiencies due to a gastrointestinal condition, which have been managed with sublingual supplements. He does not smoke or drink and was previously active, exercising five days a week.  In the emergency department patient was seen as a code stroke.  Patient was noted to be afebrile heart rates 54-100, respirations 14-23, and all other vital signs maintained. CT scan of the head head and CTA of the head and neck without any acute abnormalities.  Lab work was unremarkable.  Patient was transferred [facility] to [facility] for completion of stroke workup.  review of Systems: As mentioned in the history of present illness. All other systems reviewed and are negative. 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   Viral meningitis 04/08/2014   Vision changes 10/05/2015   Past Surgical History:  Procedure Laterality Date   BIOPSY  02/11/2023   Procedure: BIOPSY;  Surgeon: Rosalie Kitchens, MD;  Location: [facility];  Service: Gastroenterology;;   ESOPHAGOGASTRODUODENOSCOPY N/A 02/11/2023   Procedure: ESOPHAGOGASTRODUODENOSCOPY (EGD);  Surgeon: Rosalie Kitchens, MD;  Location: [facility];  Service: Gastroenterology;  Laterality: N/A;   ESOPHAGOGASTRODUODENOSCOPY N/A 04/06/2024   Procedure: EGD (ESOPHAGOGASTRODUODENOSCOPY);  Surgeon: Rosalie Kitchens, MD;  Location: [facility];  Service:  Gastroenterology;  Laterality: N/A;  no sedation   ORCHIECTOMY     ROTATOR CUFF REPAIR     SURGERY SCROTAL / TESTICULAR  2006   Social History:  reports that he has never smoked. He has never used smokeless tobacco. He reports that he does not currently use alcohol. He reports that he does not use drugs.  Allergies  Allergen Reactions   Codeine Nausea And Vomiting   Erythromycin Hives, Itching and Anaphylaxis   Iodine Anaphylaxis and Shortness Of Breath   Ioversol Anaphylaxis   Neomycin Anaphylaxis, Hives and Itching   Shellfish Allergy Anaphylaxis   Azithromycin Hives   Corn-Containing Products Nausea And Vomiting and Swelling   Wheat Nausea And Vomiting   Doxycycline  Hyclate Nausea And Vomiting   Dilaudid [Hydromorphone Hcl] Nausea And Vomiting   Nsaids Swelling and Palpitations   Prednisone  Nausea And Vomiting and Anxiety   Tetracyclines & Related Rash    Family History  Problem Relation Age of Onset   Migraines Mother    Diabetes type II Father     Prior to Admission medications   Medication Sig Start Date End Date Taking? Authorizing Provider  acetaminophen  (TYLENOL ) 500 MG tablet Take 1,000 mg by mouth every 6 (six) hours as needed for headache.   Yes [provider]  albuterol  (VENTOLIN  HFA) 108 (90 Base) MCG/ACT inhaler Inhale 2 puffs into the lungs every 6 (six) hours as needed for shortness of breath or wheezing. 09/23/23 09/22/24 Yes [provider]  Ascorbic Acid  (VITAMIN C) 125 MG CHEW Chew 125 mg by mouth daily at 12 noon.   Yes [provider]  Biotin 5000 MCG CAPS Take 5,000 mcg by mouth daily at 12 noon.   Yes [provider]  CHOLECALCIFEROL SL Place 1 tablet under the tongue 2 (two) times daily with a meal.   Yes [provider]  Ferrous Sulfate (BL IRON PO) Take 25 mg by mouth daily at 12 noon.   Yes [provider]  vitamin E 180 MG (400 UNITS) capsule Take 400 Units by mouth daily. Gamma 100 mg - Alpha    Yes [provider]  vitamin k 100 MCG tablet Take 100 mcg by mouth daily.   Yes [provider]  amoxicillin  (AMOXIL ) 500 MG capsule Take 1,000 mg by mouth. Patient not taking: Reported on 04/27/2024 04/20/24 05/04/24  [provider]  budesonide  (ENTOCORT EC ) 3 MG 24 hr capsule Take 9 mg by mouth daily. Patient not taking: Reported on 04/27/2024    [provider]  VOQUEZNA 20 MG TABS Take by mouth. Patient not taking: Reported on 04/27/2024 04/23/24   [provider]    Physical Exam: Vitals:   04/27/24 0445 04/27/24 0500 04/27/24 0515 04/27/24 0630  BP: 98/66 104/68 101/67 103/67  Pulse: (!) 56 60 (!) 54 63  Resp: 14 17 14 16   Temp:      TempSrc:      SpO2: 100% 100% 100% 100%  Height:         Constitutional: Middle-age male NAD, calm, comfortable Eyes: PERRL, lids and conjunctivae normal ENMT: Mucous membranes are moist.  Normal dentition.  Neck: normal, supple  Respiratory: clear to auscultation bilaterally, no wheezing, no crackles. Normal respiratory effort. No accessory muscle use.  Cardiovascular: Regular  rate and rhythm, with intermittent PVCs. No carotid bruits.  Abdomen: no tenderness, no masses palpated.  Bowel sounds positive.  Musculoskeletal: no clubbing / cyanosis. No joint deformity upper and lower extremities. Good ROM, no contractures. Normal muscle tone.  Skin: no rashes, lesions, ulcers. No induration Neurologic: CN 2-12 grossly intact.  Abnormal sensation reported on the left side of the face as well as left side of the body.  Patient is able to move all extremities although reports right lower extremity weakness. Psychiatric: Normal judgment and insight. Alert and oriented x 3.  Anxious mood.   Data Reviewed:  Reviewed labs, imaging, and pertinent records as documented.  Assessment and Plan:  Stroke-like symptoms Patient presents with complaints of right-sided numbness, confusion, and reports of visual  disturbances.  Patient underwent workup including CT brain, CTA of the head and neck, and subsequent MRI of the brain which did not reveal any acute abnormality or signs for large vessel occlusion.  Question the possibility of complex migraine versus TIA versus  possible demyelinating process versus other.  Patient still reported persistence of symptoms but have been able to ambulate. - Admit to a telemetry bed  - Neurochecks - PT/OT to evaluate and treat - Case discussed with neurology who initially recommended a MRI with contrast to evaluate for the possibility of a demyelinating process, however patient unable to have contrasted study due to reported anaphylactic reaction to gadolinium  PFO Most recent echocardiogram with bubble study noted small PFO on 8/11.  Scheduled right heart cath for September 3. - Continue outpatient follow-up with cardiology for right heart cath on 9/3  History of small fiber neuropathy Patient is followed by Dr. Kyung of neurology in the outpatient setting. - Continue outpatient follow-up  Collagenous gastritis Patient is followed by Dr. Liza at [facility]. - Continue outpatient follow-up with gastroenterology  POTS Patient has a long history of being near syncope for which he is followed by cardiology.  History of testicular cancer in remission Records note from Dr. Lonn of oncology from 05/20/2023 noted last imaging study showed no evidence of disease for which no long-term follow-up was recommended    DVT prophylaxis: Lovenox  Advance Care Planning:   Code Status: Full   Consults: Neurology  Family Communication: Family updated at bedside  Severity of Illness: The appropriate patient status for this patient is OBSERVATION. Observation status is judged to be reasonable and necessary in order to provide the required intensity of service to ensure the patient's safety. The patient's presenting symptoms, physical exam findings, and initial  radiographic and laboratory data in the context of their medical condition is felt to place them at decreased risk for further clinical deterioration. Furthermore, it is anticipated that the patient will be medically stable for discharge from the hospital within 2 midnights of admission.   Author: Maximino DELENA Sharps, MD 04/27/2024 8:49 AM  For on call review [email or web address].

## 2024-04-27 NOTE — ED Notes (Signed)
 Patient states he has had anaphylaxis to contrast dye for MRI and CT scans. Refusing scan with dye at this time. Patient is ready for MRI.

## 2024-04-27 NOTE — ED Notes (Signed)
 PT back to MRI

## 2024-04-27 NOTE — ED Notes (Signed)
 CCMD Called

## 2024-04-27 NOTE — Discharge Summary (Signed)
 Physician Discharge Summary   Patient: Manuel [name] MRN: [id number] DOB: 21-Apr-1981  Admit date:     04/26/2024  Discharge date: 04/27/24  Discharge Physician: Maximino DELENA Sharps   PCP: Beam, Lamar POUR, MD   Recommendations at discharge:   Patient was recommended to follow-up with cardiology and keep scheduled appointment to workup PFO and neurology in regards to his symptoms  Discharge Diagnoses: Principal Problem:   Stroke-like symptoms Active Problems:   PFO (patent foramen ovale)   Small fiber neuropathy   Collagenous gastritis   POTS (postural orthostatic tachycardia syndrome)   History of testicular cancer  Resolved Problems:   * No resolved hospital problems. *  Hospital Course: Manuel [name] is a 43 y.o male with medical history significant of POTS, collagenous gastritis, migraine headaches, seizure disorder, testicular cancer in remission, viral meningitis, small fiber neuropathy, nutrient deficiency, and history of testicular cancer in remission presents with complaints with right-sided numbness, confusion, and visual disturbances at around 4 PM yesterday.   The patient experienced a sudden onset of numbness on the right side of his body, including the right foot, arm, and face, along with confusion, inability to think or speak clearly, and visual disturbances in both eyes. These symptoms persisted for most of the night, but he feels much better today, although numbness in the right foot and face persists. No headache was present during the episode, although a slight headache occurred the afternoon before symptoms began.   He has a history of a patent foramen ovale (PFO) diagnosed by his cardiologist, with a recent bubble study that the patient reports showed a two-centimeter shunt. He is scheduled for a right heart catheterization next week. He reports episodes of blood oxygen  saturation dropping into the seventies, which can improve with positional changes.    He has experienced similar episodes of numbness and confusion since childhood, but the current episode is the most severe. Previous episodes have affected different areas, including the left side of his face, and have not resulted in long-term effects until last month.  He still reports having some the left side of his face that has persisted.  He has a history of autonomic small fiber neuropathy and has been evaluated with a Holter monitor, which showed PACs and PVCs but no significant arrhythmias.   He has a history of nutrient deficiencies due to a gastrointestinal condition, which have been managed with sublingual supplements. He does not smoke or drink and was previously active, exercising five days a week.   In the emergency department patient was seen as a code stroke.  Patient was noted to be afebrile heart rates 54-100, respirations 14-23, and all other vital signs maintained. CT scan of the head head and CTA of the head and neck without any acute abnormalities.  Lab work was unremarkable.  [facility] was [facility] for completion of stroke workup.    Assessment and Plan: Stroke-like symptoms Patient presents with complaints of right-sided numbness, confusion, and reports of visual disturbances.  Patient underwent workup including CT brain, CTA of the head and neck, and subsequent MRI of the brain which did not reveal any acute abnormality or signs for large vessel occlusion.  Question the possibility of complex migraine versus TIA versus  possible demyelinating process versus other.  Patient still reported persistence of symptoms but have been able to ambulate.  Patient had just recently had echocardiogram and after talks with neurology no further workup was recommended as patient  was unable to have MRI of the brain with contrast due to allergic reaction.  Neurology recommending patient to follow-up in the outpatient setting with his neurologist and cardiology as  previously scheduled   PFO Most recent echocardiogram with bubble study noted small PFO on 8/11.  Scheduled right heart cath for September 3. - Continue outpatient follow-up with cardiology for right heart cath on 9/3   History of small fiber neuropathy Patient is followed by Dr. Kyung of neurology in the outpatient setting. - Continue outpatient follow-up   Collagenous gastritis Patient is followed by Dr. Liza at [facility]. - Continue outpatient follow-up with gastroenterology   POTS Patient has a long history of being near syncope for which he is followed by cardiology.   History of testicular cancer in remission Records note from Dr. Lonn of oncology from 05/20/2023 noted last imaging study showed no evidence of disease for which no long-term follow-up was recommended            Consultants: Neurology Procedures performed: None Disposition: Home Diet recommendation:  Discharge Diet Orders (From admission, onward)     Start     Ordered   04/27/24 0000  Diet - low sodium heart healthy        04/27/24 0942           Regular diet DISCHARGE MEDICATION: Allergies as of 04/27/2024       Reactions   Codeine Nausea And Vomiting   Erythromycin Hives, Itching, Anaphylaxis   Iodine Anaphylaxis, Shortness Of Breath   Ioversol Anaphylaxis   Neomycin Anaphylaxis, Hives, Itching   Shellfish Allergy Anaphylaxis   Azithromycin Hives   Corn-containing Products Nausea And Vomiting, Swelling   Wheat Nausea And Vomiting   Doxycycline  Hyclate Nausea And Vomiting   Dilaudid [hydromorphone Hcl] Nausea And Vomiting   Nsaids Swelling, Palpitations   Prednisone  Nausea And Vomiting, Anxiety   Tetracyclines & Related Rash        Medication List     PAUSE taking these medications    amoxicillin  500 MG capsule Wait to take this until your doctor or other care provider tells you to start again. On hold pending stomach infection treatment.  Commonly known as:  AMOXIL  Take 1,000 mg by mouth.   budesonide  3 MG 24 hr capsule Wait to take this until your doctor or other care provider tells you to start again. Commonly known as: ENTOCORT EC  Take 9 mg by mouth daily.   Voquezna 20 MG Tabs Wait to take this until your doctor or other care provider tells you to start again. Generic drug: Vonoprazan Fumarate Take by mouth.       TAKE these medications    acetaminophen  500 MG tablet Commonly known as: TYLENOL  Take 1,000 mg by mouth every 6 (six) hours as needed for headache.   albuterol  108 (90 Base) MCG/ACT inhaler Commonly known as: VENTOLIN  HFA Inhale 2 puffs into the lungs every 6 (six) hours as needed for shortness of breath or wheezing.   Biotin 5000 MCG Caps Take 5,000 mcg by mouth daily at 12 noon.   BL IRON PO Take 25 mg by mouth daily at 12 noon.   CHOLECALCIFEROL SL Place 1 tablet under the tongue 2 (two) times daily with a meal.   Vitamin C 125 MG Chew Chew 125 mg by mouth daily at 12 noon.   vitamin E 180 MG (400 UNITS) capsule Take 400 Units by mouth daily. Gamma 100 mg - Alpha   vitamin k 100  MCG tablet Take 100 mcg by mouth daily.        Follow-up Information     Beam, Lamar POUR, MD.   Specialty: Family Medicine Contact information: [address] [place] [address] [phone]                Discharge Exam: Today's Vitals   04/27/24 0515 04/27/24 0630 04/27/24 0946 04/27/24 1032  BP: 101/67 103/67 97/61   Pulse: (!) 54 63 60   Resp: 14 16 18    Temp:   98 F (36.7 C)   TempSrc:   Oral   SpO2: 100% 100% 100%   Height:      PainSc:    0-No pain   Body mass index is 18.27 kg/m.  Constitutional: Middle-age male NAD, calm, comfortable Eyes: PERRL, lids and conjunctivae normal ENMT: Mucous membranes are moist.  Normal dentition.  Neck: normal, supple  Respiratory: clear to auscultation bilaterally, no wheezing, no crackles. Normal respiratory effort. No accessory muscle use.   Cardiovascular: Regular rate and rhythm, with intermittent PVCs. No carotid bruits.  Abdomen: no tenderness, no masses palpated.  Bowel sounds positive.  Musculoskeletal: no clubbing / cyanosis. No joint deformity upper and lower extremities. Good ROM, no contractures. Normal muscle tone.  Skin: no rashes, lesions, ulcers. No induration Neurologic: CN 2-12 grossly intact.  Abnormal sensation reported on the left side of the face as well as left side of the body.  Patient is able to move all extremities although reports right lower extremity weakness. Psychiatric: Normal judgment and insight. Alert and oriented x 3.  Anxious mood.   Condition at discharge: stable  The results of significant diagnostics from this hospitalization (including imaging, microbiology, ancillary and laboratory) are listed below for reference.   Imaging Studies: MR ANGIO NECK WO CONTRAST Result Date: 04/27/2024 CLINICAL DATA:  Initial evaluation for syncope/presyncope. EXAM: MRA NECK WITHOUT CONTRAST TECHNIQUE: Angiographic images of the neck were acquired using MRA technique without intravenous contrast. Carotid stenosis measurements (when applicable) are obtained utilizing NASCET criteria, using the distal internal carotid diameter as the denominator. COMPARISON:  Prior study from 03/07/2024. FINDINGS: Aortic arch: Visualized aortic arch within normal limits for caliber with standard 3 vessel morphology. No stenosis or other abnormality about the origin of the great vessels. Right carotid system: Right common and internal carotid arteries are patent with antegrade flow. No evidence for dissection. No hemodynamically significant stenosis about the right carotid artery system. Left carotid system: Left common and internal carotid arteries are patent with antegrade flow. No evidence for dissection. No hemodynamically significant stenosis about the left carotid artery system. Vertebral arteries: Both vertebral arteries arise from  the subclavian arteries. No proximal subclavian artery stenosis. Left vertebral artery dominant. Vertebral arteries are patent with antegrade flow. No evidence for dissection. No stenosis about either vertebral artery. Other: None. IMPRESSION: Normal MRA of the neck. Electronically Signed   By: Morene Hoard M.D.   On: 04/27/2024 04:05   MR BRAIN WO CONTRAST Result Date: 04/27/2024 CLINICAL DATA:  Initial evaluation for acute neuro deficit, stroke suspected. EXAM: MRI HEAD WITHOUT CONTRAST MRA HEAD WITHOUT CONTRAST TECHNIQUE: Multiplanar, multi-echo pulse sequences of the brain and surrounding structures were acquired without intravenous contrast. Angiographic images of the Circle of Willis were acquired using MRA technique without intravenous contrast. COMPARISON:  Prior CT from earlier the same day as well as prior studies from 03/06/2024. FINDINGS: MRI HEAD FINDINGS Brain: Cerebral volume within normal limits for age. No focal parenchymal signal abnormality.  No abnormal foci of restricted diffusion to suggest acute or subacute ischemia. Gray-white matter differentiation well maintained. No encephalomalacia to suggest chronic cortical infarction or other insult. No foci of susceptibility artifact indicative of acute or chronic intracranial blood products. No mass lesion, midline shift or mass effect. Ventricles normal in size and morphology without hydrocephalus. No extra-axial fluid collection. 5 mm hyperdensity at the posterior pituitary gland, either a prominent pituitary bright spot versus small pars intermedius cyst. Pituitary gland and suprasellar region otherwise unremarkable. Vascular: Major intracranial vascular flow voids are well maintained. Skull and upper cervical spine: Craniocervical junction within normal limits. Visualized upper cervical spine demonstrates no significant finding. Bone marrow signal intensity within normal limits. No scalp soft tissue abnormality. Sinuses/Orbits: Globes  and orbital soft tissues are within normal limits. Mild scattered mucosal thickening present about the ethmoidal air cells and maxillary sinuses. No significant mastoid effusion. Other: None. MRA HEAD FINDINGS Anterior circulation: Both internal carotid arteries widely patent to the termini without stenosis. A1 segments widely patent. Normal anterior communicating artery complex. Both anterior cerebral arteries widely patent to their distal aspects without stenosis. No M1 stenosis or occlusion. Normal MCA bifurcations. Distal MCA branches well perfused and symmetric. Posterior circulation: Both V4 segments patent without significant stenosis. Left vertebral artery dominant. Left PICA patent at its origin. Right PICA origin not seen. Basilar patent without stenosis. Superior cerebellar and posterior cerebral arteries patent bilaterally. Anatomic variants: None significant.  No intracranial aneurysm. IMPRESSION: 1. Negative brain MRI.  No acute intracranial abnormality. 2. Normal intracranial MRA. Electronically Signed   By: Morene Hoard M.D.   On: 04/27/2024 01:18   MR ANGIO HEAD WO CONTRAST Result Date: 04/27/2024 CLINICAL DATA:  Initial evaluation for acute neuro deficit, stroke suspected. EXAM: MRI HEAD WITHOUT CONTRAST MRA HEAD WITHOUT CONTRAST TECHNIQUE: Multiplanar, multi-echo pulse sequences of the brain and surrounding structures were acquired without intravenous contrast. Angiographic images of the Circle of Willis were acquired using MRA technique without intravenous contrast. COMPARISON:  Prior CT from earlier the same day as well as prior studies from 03/06/2024. FINDINGS: MRI HEAD FINDINGS Brain: Cerebral volume within normal limits for age. No focal parenchymal signal abnormality. No abnormal foci of restricted diffusion to suggest acute or subacute ischemia. Gray-white matter differentiation well maintained. No encephalomalacia to suggest chronic cortical infarction or other insult. No foci  of susceptibility artifact indicative of acute or chronic intracranial blood products. No mass lesion, midline shift or mass effect. Ventricles normal in size and morphology without hydrocephalus. No extra-axial fluid collection. 5 mm hyperdensity at the posterior pituitary gland, either a prominent pituitary bright spot versus small pars intermedius cyst. Pituitary gland and suprasellar region otherwise unremarkable. Vascular: Major intracranial vascular flow voids are well maintained. Skull and upper cervical spine: Craniocervical junction within normal limits. Visualized upper cervical spine demonstrates no significant finding. Bone marrow signal intensity within normal limits. No scalp soft tissue abnormality. Sinuses/Orbits: Globes and orbital soft tissues are within normal limits. Mild scattered mucosal thickening present about the ethmoidal air cells and maxillary sinuses. No significant mastoid effusion. Other: None. MRA HEAD FINDINGS Anterior circulation: Both internal carotid arteries widely patent to the termini without stenosis. A1 segments widely patent. Normal anterior communicating artery complex. Both anterior cerebral arteries widely patent to their distal aspects without stenosis. No M1 stenosis or occlusion. Normal MCA bifurcations. Distal MCA branches well perfused and symmetric. Posterior circulation: Both V4 segments patent without significant stenosis. Left vertebral artery dominant. Left PICA patent at its origin. Right  PICA origin not seen. Basilar patent without stenosis. Superior cerebellar and posterior cerebral arteries patent bilaterally. Anatomic variants: None significant.  No intracranial aneurysm. IMPRESSION: 1. Negative brain MRI.  No acute intracranial abnormality. 2. Normal intracranial MRA. Electronically Signed   By: Morene Hoard M.D.   On: 04/27/2024 01:18   CT HEAD CODE STROKE WO CONTRAST Result Date: 04/26/2024 CLINICAL DATA:  Code stroke. EXAM: CT HEAD WITHOUT  CONTRAST TECHNIQUE: Contiguous axial images were obtained from the base of the skull through the vertex without intravenous contrast. RADIATION DOSE REDUCTION: This exam was performed according to the departmental dose-optimization program which includes automated exposure control, adjustment of the mA and/or kV according to patient size and/or use of iterative reconstruction technique. COMPARISON:  None Available. FINDINGS: Brain: Cerebral volume within normal limits for patient age. No acute intracranial hemorrhage. No acute large vessel territory infarct. No mass lesion, midline shift, or mass effect. Ventricles are normal in size without hydrocephalus. No extra-axial fluid collection. Vascular: No abnormal hyperdense vessel. Skull: Scalp soft tissues demonstrate no acute abnormality. Calvarium intact. Sinuses/Orbits: Globes and orbital soft tissues within normal limits. Visualized paranasal sinuses are largely clear. No significant mastoid effusion. ASPECTS [facility] Stroke Program Early CT Score) - Ganglionic level infarction (caudate, lentiform nuclei, internal capsule, insula, M1-M3 cortex): 7 - Supraganglionic infarction (M4-M6 cortex): 3 Total score (0-10 with 10 being normal): 10 IMPRESSION: 1. Normal head CT.  No acute intracranial abnormality. 2. ASPECTS is 10. Results were called by telephone at the time of interpretation on 04/26/2024 at 8:12 pm to provider [facility] , who verbally acknowledged these results. Electronically Signed   By: Morene Hoard M.D.   On: 04/26/2024 20:12   ECHOCARDIOGRAM LIMITED BUBBLE STUDY Result Date: 04/12/2024    ECHOCARDIOGRAM LIMITED REPORT   Patient Name:   KONSTANTINOS CORDOBA [name] Date of Exam: 04/12/2024 Medical Rec #:  [id number]            Height:       70.0 in Accession #:    [id number]           Weight:       130.0 lb Date of Birth:  [birth year]           BSA:          1.738 m Patient Age:    42 years             BP:           118/70 mmHg Patient Gender: M                     HR:           77 bpm. Exam Location:  [address] Procedure: Limited Echo, 2D Echo, Limited Color Doppler and Saline Contrast            Bubble Study (Both Spectral and Color Flow Doppler were utilized            during procedure). 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                 Arrythmias:PAC and Autonomic dysfunction; Signs/Symptoms:Syncope                 and Possible PFO. Previous echo revealed LVEF 65%.  Sonographer:    Nolon Berg [facility], RDCS Referring Phys: [id number] WILL MARTIN CAMNITZ  Sonographer Comments: O2  Saturation taken during exam-98% maintained standing, sitting and laying flat without change. Bubble study performed with Valsalva manuever, lying flat and sitting upright. IMPRESSIONS  1. Left ventricular ejection fraction, by estimation, is 60 to 65%. The left ventricle has normal function. The left ventricle has no regional wall motion abnormalities.  2. Right ventricular systolic function is normal. The right ventricular size is normal.  3. The aortic valve is tricuspid.  4. Bubble study was not positive at rest but was positive with Valsalva, suggesting small PFO.  5. A small pericardial effusion is present. The pericardial effusion is localized near the right ventricle.  6. Limited echo for bubble study. FINDINGS  Left Ventricle: Left ventricular ejection fraction, by estimation, is 60 to 65%. The left ventricle has normal function. The left ventricle has no regional wall motion abnormalities. The left ventricular internal cavity size was normal in size. There is  no left ventricular hypertrophy. Right Ventricle: The right ventricular size is normal. No increase in right ventricular wall thickness. Right ventricular systolic function is normal. Left Atrium: Left atrial size was normal in size. Right Atrium: Right atrial size was normal in size. Pericardium: A small pericardial effusion is present. The  pericardial effusion is localized near the right ventricle. Aortic Valve: The aortic valve is tricuspid. Pulmonic Valve: The pulmonic valve was normal in structure. Pulmonic valve regurgitation is not visualized. Aorta: The aortic root is normal in size and structure. IAS/Shunts: Bubble study was not positive at rest but was positive with Valsalva, suggesting small PFO. Agitated saline contrast was given intravenously to evaluate for intracardiac shunting. LEFT VENTRICLE PLAX 2D LVIDd:         4.30 cm LVIDs:         2.05 cm LV PW:         0.80 cm LV IVS:        0.80 cm LVOT diam:     2.00 cm LVOT Area:     3.14 cm  LEFT ATRIUM         Index LA diam:    2.80 cm 1.61 cm/m   AORTA Ao Root diam: 2.90 cm Ao Asc diam:  2.90 cm  SHUNTS Systemic Diam: 2.00 cm Dalton McleanMD Electronically signed by Ezra Kanner Signature Date/Time: 04/12/2024/5:28:04 PM    Final     Microbiology: Results for orders placed or performed during the hospital encounter of 04/12/17  Rapid strep screen     Status: None   Collection Time: 04/12/17  4:50 PM   Specimen: Other  Result Value Ref Range Status   Streptococcus, Group A Screen (Direct) NEGATIVE NEGATIVE Final    Comment: (NOTE) A Rapid Antigen test may result negative if the antigen level in the sample is below the detection level of this test. The FDA has not cleared this test as a stand-alone test therefore the rapid antigen negative result has reflexed to a Group A Strep culture.   Culture, group A strep     Status: None   Collection Time: 04/12/17  4:50 PM   Specimen: Throat  Result Value Ref Range Status   Specimen Description THROAT  Final   Special Requests NONE Reflexed from [id number]  Final   Culture NO GROUP A STREP (S.PYOGENES) ISOLATED  Final   Report Status 04/14/2017 FINAL  Final    Labs: CBC: Recent Labs  Lab 04/26/24 1951  WBC 8.6  NEUTROABS 5.1  HGB 14.4  HCT 42.0  MCV 86.8  PLT 299   Basic  Metabolic Panel: Recent Labs  Lab  04/26/24 1951  NA 137  K 3.7  CL 101  CO2 23  GLUCOSE 105*  BUN 16  CREATININE 1.00  CALCIUM 10.2   Liver Function Tests: Recent Labs  Lab 04/26/24 1951  AST 17  ALT 15  ALKPHOS 81  BILITOT 1.1  PROT 7.6  ALBUMIN 4.8   CBG: Recent Labs  Lab 04/26/24 1945  GLUCAP 103*    Discharge time spent: less than 30 minutes.  Signed: Maximino DELENA Sharps, MD [facility] 04/27/2024

## 2024-04-27 NOTE — ED Notes (Signed)
 Patient was placed on the monitor. NIH stroke scale performed. Patient still reports right arm and right leg weakness. Patient states his vision is still blurry and he may also have some peripheral field vision based on a recent appt with his optometrist. Attempted to make the patient more comfortable while in the hallway. Lights were turned down because he stated the lights were bothering him.

## 2024-04-30 NOTE — Telephone Encounter (Signed)
 Spoke with pt regarding his concerns about upcoming right heart cath. Dr. Lavona is to call pt regarding his quesitons. Pt verbalized understanding. All questions if any were answered.

## 2024-05-04 ENCOUNTER — Telehealth: Payer: Self-pay | Admitting: *Deleted

## 2024-05-04 NOTE — Telephone Encounter (Signed)
 Right Heart Cath scheduled at [facility] for: Wednesday May 05, 2024 8:30 AM Arrival time [facility] Main Entrance A at: 6:30 AM  Diet: -Nothing to eat after midnight.  Hydration: -May drink clear liquids until 2 hours before the procedure.  Approved liquids: Water , clear tea, black coffee, fruit juices-non-citric and without pulp,Gatorade, plain Jello/popsicles.  Medication instructions: -Usual morning medications can be taken.  Plan to go home the same day, you will only stay overnight if medically necessary.  You must have responsible adult to drive you home.  Someone must be with you the first 24 hours after you arrive home.  Reviewed procedure instructions with patient.

## 2024-05-05 ENCOUNTER — Encounter (HOSPITAL_COMMUNITY)
Admission: RE | Disposition: A | Discharge status: Home / Self Care | Payer: Self-pay | Source: Home / Self Care | Attending: Cardiology

## 2024-05-05 ENCOUNTER — Other Ambulatory Visit: Payer: Self-pay

## 2024-05-05 ENCOUNTER — Telehealth: Payer: Self-pay | Admitting: Cardiology

## 2024-05-05 ENCOUNTER — Ambulatory Visit (HOSPITAL_COMMUNITY)
Admission: RE | Admit: 2024-05-05 | Discharge: 2024-05-05 | Disposition: A | Discharge status: Home / Self Care | Attending: Cardiology | Admitting: Cardiology

## 2024-05-05 ENCOUNTER — Encounter (HOSPITAL_COMMUNITY): Payer: Self-pay | Admitting: Cardiology

## 2024-05-05 DIAGNOSIS — R0602 Shortness of breath: Secondary | ICD-10-CM | POA: Insufficient documentation

## 2024-05-05 DIAGNOSIS — Q2112 Patent foramen ovale: Secondary | ICD-10-CM | POA: Diagnosis not present

## 2024-05-05 DIAGNOSIS — I471 Supraventricular tachycardia, unspecified: Secondary | ICD-10-CM | POA: Insufficient documentation

## 2024-05-05 HISTORY — PX: RIGHT HEART CATH: CATH118263

## 2024-05-05 LAB — POCT I-STAT EG7
Acid-Base Excess: 1 mmol/L (ref 0.0–2.0)
Acid-Base Excess: 1 mmol/L (ref 0.0–2.0)
Acid-Base Excess: 0 mmol/L (ref 0.0–2.0)
Acid-base deficit: 2 mmol/L (ref 0.0–2.0)
Acid-base deficit: 1 mmol/L (ref 0.0–2.0)
Bicarbonate: 17.9 mmol/L — ABNORMAL LOW (ref 20.0–28.0)
Bicarbonate: 24.7 mmol/L (ref 20.0–28.0)
Bicarbonate: 25.2 mmol/L (ref 20.0–28.0)
Bicarbonate: 23 mmol/L (ref 20.0–28.0)
Bicarbonate: 24.1 mmol/L (ref 20.0–28.0)
Calcium, Ion: 0.94 mmol/L — ABNORMAL LOW (ref 1.15–1.40)
Calcium, Ion: 1.19 mmol/L (ref 1.15–1.40)
Calcium, Ion: 1.19 mmol/L (ref 1.15–1.40)
Calcium, Ion: 1.09 mmol/L — ABNORMAL LOW (ref 1.15–1.40)
Calcium, Ion: 1.21 mmol/L (ref 1.15–1.40)
HCT: 36 % — ABNORMAL LOW (ref 39.0–52.0)
HCT: 40 % (ref 39.0–52.0)
HCT: 40 % (ref 39.0–52.0)
HCT: 38 % — ABNORMAL LOW (ref 39.0–52.0)
HCT: 40 % (ref 39.0–52.0)
Hemoglobin: 12.2 g/dL — ABNORMAL LOW (ref 13.0–17.0)
Hemoglobin: 13.6 g/dL (ref 13.0–17.0)
Hemoglobin: 13.6 g/dL (ref 13.0–17.0)
Hemoglobin: 12.9 g/dL — ABNORMAL LOW (ref 13.0–17.0)
Hemoglobin: 13.6 g/dL (ref 13.0–17.0)
O2 Saturation: 98 %
O2 Saturation: 77 %
O2 Saturation: 81 %
O2 Saturation: 83 %
O2 Saturation: 85 %
Potassium: 2.9 mmol/L — ABNORMAL LOW (ref 3.5–5.1)
Potassium: 3.6 mmol/L (ref 3.5–5.1)
Potassium: 3.6 mmol/L (ref 3.5–5.1)
Potassium: 3.3 mmol/L — ABNORMAL LOW (ref 3.5–5.1)
Potassium: 3.5 mmol/L (ref 3.5–5.1)
Sodium: 144 mmol/L (ref 135–145)
Sodium: 139 mmol/L (ref 135–145)
Sodium: 140 mmol/L (ref 135–145)
Sodium: 139 mmol/L (ref 135–145)
Sodium: 142 mmol/L (ref 135–145)
TCO2: 18 mmol/L — ABNORMAL LOW (ref 22–32)
TCO2: 26 mmol/L (ref 22–32)
TCO2: 26 mmol/L (ref 22–32)
TCO2: 24 mmol/L (ref 22–32)
TCO2: 25 mmol/L (ref 22–32)
pCO2, Ven: 20.2 mmHg — ABNORMAL LOW (ref 44–60)
pCO2, Ven: 35.9 mmHg — ABNORMAL LOW (ref 44–60)
pCO2, Ven: 39.8 mmHg — ABNORMAL LOW (ref 44–60)
pCO2, Ven: 34.3 mmHg — ABNORMAL LOW (ref 44–60)
pCO2, Ven: 36.1 mmHg — ABNORMAL LOW (ref 44–60)
pH, Ven: 7.555 — ABNORMAL HIGH (ref 7.25–7.43)
pH, Ven: 7.41 (ref 7.25–7.43)
pH, Ven: 7.446 — ABNORMAL HIGH (ref 7.25–7.43)
pH, Ven: 7.433 — ABNORMAL HIGH (ref 7.25–7.43)
pH, Ven: 7.434 — ABNORMAL HIGH (ref 7.25–7.43)
pO2, Ven: 82 mmHg — ABNORMAL HIGH (ref 32–45)
pO2, Ven: 40 mmHg (ref 32–45)
pO2, Ven: 44 mmHg (ref 32–45)
pO2, Ven: 46 mmHg — ABNORMAL HIGH (ref 32–45)
pO2, Ven: 47 mmHg — ABNORMAL HIGH (ref 32–45)

## 2024-05-05 SURGERY — RIGHT HEART CATH
Anesthesia: LOCAL

## 2024-05-05 MED ORDER — SODIUM CHLORIDE 0.9 % IV SOLN: 250.0000 mL | INTRAVENOUS | Status: DC | PRN

## 2024-05-05 MED ORDER — HEPARIN (PORCINE) IN NACL 1000-0.9 UT/500ML-% IV SOLN
INTRAVENOUS | Status: DC | PRN
  Administered 2024-05-05: 1000 mL

## 2024-05-05 MED ORDER — HYDRALAZINE HCL 20 MG/ML IJ SOLN: 10.0000 mg | INTRAMUSCULAR | Status: DC | PRN

## 2024-05-05 MED ORDER — SODIUM CHLORIDE 0.9% FLUSH
3.0000 mL | INTRAVENOUS | Status: DC | PRN
Start: 2024-05-05 — End: 2024-05-05

## 2024-05-05 MED ORDER — ACETAMINOPHEN 325 MG PO TABS: 650.0000 mg | ORAL_TABLET | ORAL | Status: DC | PRN

## 2024-05-05 MED ORDER — FREE WATER: 500.0000 mL | Freq: Once | Status: DC

## 2024-05-05 MED ORDER — SODIUM CHLORIDE 0.9% FLUSH: 3.0000 mL | Freq: Two times a day (BID) | INTRAVENOUS | Status: DC

## 2024-05-05 MED ORDER — SODIUM CHLORIDE 0.9% FLUSH: 3.0000 mL | INTRAVENOUS | Status: DC | PRN

## 2024-05-05 MED ORDER — ONDANSETRON HCL 4 MG/2ML IJ SOLN: 4.0000 mg | Freq: Four times a day (QID) | INTRAMUSCULAR | Status: DC | PRN

## 2024-05-05 MED ORDER — LABETALOL HCL 5 MG/ML IV SOLN: 10.0000 mg | INTRAVENOUS | Status: DC | PRN

## 2024-05-05 MED ORDER — METOPROLOL TARTRATE 5 MG/5ML IV SOLN
INTRAVENOUS | Status: DC | PRN
  Administered 2024-05-05: 2.5 mg via INTRAVENOUS

## 2024-05-05 MED ORDER — METOPROLOL TARTRATE 5 MG/5ML IV SOLN
INTRAVENOUS | Status: AC
Start: 2024-05-05 — End: 2024-05-05
  Filled 2024-05-05: qty 5

## 2024-05-05 SURGICAL SUPPLY — 4 items
CATH BALLN WEDGE 5F 110CM (CATHETERS) IMPLANT
KIT RIGHT HEART ACIST (MISCELLANEOUS) IMPLANT
PACK CARDIAC CATHETERIZATION (CUSTOM PROCEDURE TRAY) ×1 IMPLANT
SHEATH GLIDE SLENDER 4/5FR (SHEATH) IMPLANT

## 2024-05-05 NOTE — Interval H&P Note (Signed)
 History and Physical Interval Note:  05/05/2024 7:44 AM  Manuel [name]  has presented today for surgery, with the diagnosis of pfo - shortness of breath.  The various methods of treatment have been discussed with the patient and family. After consideration of risks, benefits and other options for treatment, the patient has consented to  Procedure(s): RIGHT HEART CATH (N/A) as a surgical intervention.  The patient's history has been reviewed, patient examined, no change in status, stable for surgery.  I have reviewed the patient's chart and labs.  Questions were answered to the patient's satisfaction.     [name] [employer]

## 2024-05-05 NOTE — Discharge Instructions (Signed)

## 2024-05-05 NOTE — Progress Notes (Signed)
 Discharge instructions reviewed with patient and Significant other at bedside. Denies questions or concerns at this time. PT was able to ambulate to the bathroom, voided without difficulty. No S/S of complications at incision site. PT escorted from the unit via wheel chair to personal vehicle.

## 2024-05-05 NOTE — Telephone Encounter (Signed)
 Patient requesting callback from nurse regarding labs from today after heart cath procedure. Pt has been having palpitations and SVTs. Please advise

## 2024-05-05 NOTE — Telephone Encounter (Signed)
 Spoke with pt regarding his concerns. Pt is incredibly nervous about his lab results posted 9/3. Latest labs posted at 8:15 this morning were within normal range. Pt was notified. Pt stated he thought his labs were the cause of the arrhythmias he is experiencing. Pt is also having numbness of the face and hands which the pt stated Dr. Rolan said he would message neurology about. Pt was scheduled to see Lum Louis, NP on 9/17. Pt was given ED precautions. Pt verbalized understanding. All questions if any were answered.

## 2024-05-05 NOTE — Telephone Encounter (Signed)
 Will send copy to patient's cardiologist.

## 2024-05-05 NOTE — Telephone Encounter (Signed)
 See telephone call from 9/3

## 2024-05-06 ENCOUNTER — Other Ambulatory Visit (HOSPITAL_COMMUNITY)

## 2024-05-07 DIAGNOSIS — M542 Cervicalgia: Secondary | ICD-10-CM | POA: Diagnosis not present

## 2024-05-17 ENCOUNTER — Encounter: Payer: Self-pay | Admitting: *Deleted

## 2024-05-18 DIAGNOSIS — G901 Familial dysautonomia [Riley-Day]: Secondary | ICD-10-CM | POA: Diagnosis not present

## 2024-05-18 DIAGNOSIS — G629 Polyneuropathy, unspecified: Secondary | ICD-10-CM | POA: Diagnosis not present

## 2024-05-18 DIAGNOSIS — K909 Intestinal malabsorption, unspecified: Secondary | ICD-10-CM | POA: Diagnosis not present

## 2024-05-19 ENCOUNTER — Ambulatory Visit: Attending: Cardiology | Admitting: Emergency Medicine

## 2024-05-19 ENCOUNTER — Encounter: Payer: Self-pay | Admitting: Emergency Medicine

## 2024-05-19 ENCOUNTER — Telehealth: Payer: Self-pay | Admitting: *Deleted

## 2024-05-19 VITALS — BP 102/60 | HR 71 | Ht 70.0 in | Wt 130.8 lb

## 2024-05-19 DIAGNOSIS — I471 Supraventricular tachycardia, unspecified: Secondary | ICD-10-CM

## 2024-05-19 DIAGNOSIS — G629 Polyneuropathy, unspecified: Secondary | ICD-10-CM | POA: Diagnosis not present

## 2024-05-19 DIAGNOSIS — Q2112 Patent foramen ovale: Secondary | ICD-10-CM

## 2024-05-19 DIAGNOSIS — R002 Palpitations: Secondary | ICD-10-CM

## 2024-05-19 DIAGNOSIS — R55 Syncope and collapse: Secondary | ICD-10-CM | POA: Diagnosis not present

## 2024-05-19 DIAGNOSIS — G909 Disorder of the autonomic nervous system, unspecified: Secondary | ICD-10-CM | POA: Diagnosis not present

## 2024-05-19 NOTE — Progress Notes (Signed)
 Cardiology Office Note:    Date:  05/19/2024  ID:  [name], DOB 04-May-1981, MRN [id number] PCP: Beam, Lamar POUR, MD  [facility] Providers Cardiologist:  Lynwood Schilling, MD Electrophysiologist:  Elspeth Sage, MD       Patient Profile:       Chief Complaint: Follow-up after right heart catheterization History of Present Illness:  Manuel [name] is a 43 y.o. male with visit-pertinent history of PVCs, PACs, PFO, autonomic dysfunction, history of testicular cancer s/p radiation in remission, small fiber neuropathy, seizures  Patient has very complex medical history including small fiber neuropathy.  He has significant autonomic dysfunction thought caused by neuropathy according to his neurologist.  Echocardiogram 10/2023 with LVEF 60 to 65%, no RWMA, normal diastolic parameters, RV function normal, trivial MR.  CT cardiac scoring 10/2023 showed coronary calcium score 0.  ZIO monitor 01/2024 showed predominant rhythm sinus with less than 1% atrial ectopy and ventricular ectopy and no arrhythmias detected.  Echocardiogram bubble study on 04/2024 showed LVEF 60 to 65%, no RWMA, bubble study was not positive at rest but was positive with Valsalva suggesting small PFO.  He was admitted June 2025 with syncope.  He has autonomic dysfunction previously diagnosed at [facility].    He is seen for follow-up with EP on 04/01/2024.  His symptoms were inconsistent with POTS.  Noted to have limited treatment options beyond pain management.  Treadmill stress test was ordered to evaluate cardiac response during exercise.  He was last seen in clinic on 04/19/2024 by Dr. Schilling.  Patient is very symptomatic and has progressive exercise intolerance.  He reports dyspnea on exertion as well.  Reported he did not complete treadmill stress test as he does not think he can get on a treadmill.  He is losing his peripheral vision is actually seeing ophthalmology who thought he had some ischemic changes and  has further testing planned.  Right heart catheterization was planned to evaluate PFO.  Prior cath is completed on 06/01/2024 showing positive bubble study with Valsalva likely represents a small hemodynamically insignificant PFO.   Discussed the use of AI scribe software for clinical note transcription with the patient, who gave verbal consent to proceed.  History of Present Illness Manuel [name] is a 43 year old male with a history of PFO and who presents with exertional syncope and numbness.  He has a patent foramen ovale (PFO) confirmed by echocardiogram bubble study. He experiences exertional syncope with significant oxygen  desaturation during activities like lifting or walking. Symptoms worsen when lying on his left side, with oxygen  saturation dropping to the 70s and 80s during near-syncope episodes.  He has ischemic retinal changes, with peripheral vision blackout and small pupils, attributed to lack of oxygen  or blood flow. He experiences migraines with aura.  Persistent numbness in his face and right foot began after separate hospital events, with intermittent numbness and pain in his right arm. These symptoms have progressed in severity over the last couple of years.  He has a history of arrhythmias, including SVT, PACs, and PVCs, exacerbated by lying on his left side. He uses an Psychologist, educational to monitor his heart rhythm.  He denies any chest pains, orthopnea, PND, hematochezia, melena  Review of systems:  Please see the history of present illness. All other systems are reviewed and otherwise negative.      Studies Reviewed:    EKG Interpretation Date/Time:  Wednesday May 19 2024 09:14:30 EDT Ventricular  Rate:  71 PR Interval:  128 QRS Duration:  88 QT Interval:  386 QTC Calculation: 419 R Axis:   84  Text Interpretation: Normal sinus rhythm Normal ECG When compared with ECG of 05-May-2024 07:23, No significant change was found Confirmed  by Rana Dixon [id number]) on 05/19/2024 1:18:22 PM    Right heart catheterization on 05/05/2024 1. Normal hemodynamics.  2. High cardiac output (CI 4.5).  3. There is no evidence for a significant step-up in oxygen  saturation from SVC to PA.  4. Of note, patient had a symptomatic run of SVT (appeared to be atrial tachycardia, rate 150-160) when we entered the right atrium.  He received 2.5 mg IV metoprolol  but converted back to NSR on his own prior to medication.  He says he wakes up at night at times with the same symptoms.    Positive bubble study with Valsalva likely represents a small, hemodynamically insignificant PFO.   Echocardiogram bubble limited 04/12/2024  1. Left ventricular ejection fraction, by estimation, is 60 to 65%. The  left ventricle has normal function. The left ventricle has no regional  wall motion abnormalities.   2. Right ventricular systolic function is normal. The right ventricular  size is normal.   3. The aortic valve is tricuspid.   4. Bubble study was not positive at rest but was positive with Valsalva,  suggesting small PFO.   5. A small pericardial effusion is present. The pericardial effusion is  localized near the right ventricle.   6. Limited echo for bubble study.   ZIO 01/14/2024 14 day monitor   Predominant rhythm: Sinus Sinus HR: 47 - 135 bpm, AVG 68 bpm   < 1% atrial ectopy <1% ventricular ectopy   Arrhythmia detected: none   Patient triggered events: isolated ectopic beats  CT cardiac scoring 10/31/2023 IMPRESSION: 1. Coronary calcium score of 0.   2. Non-cardiac: See separate report from [facility].  Echocardiogram 10/23/2023  1. Left ventricular ejection fraction, by estimation, is 60 to 65%. The  left ventricle has normal function. The left ventricle has no regional  wall motion abnormalities. Left ventricular diastolic parameters were  normal.   2. Right ventricular systolic function is normal. The right ventricular   size is normal.   3. The mitral valve is normal in structure. Trivial mitral valve  regurgitation. No evidence of mitral stenosis.   4. The aortic valve is tricuspid. Aortic valve regurgitation is not  visualized. No aortic stenosis is present.   5. The inferior vena cava is normal in size with greater than 50%  respiratory variability, suggesting right atrial pressure of 3 mmHg.  Risk Assessment/Calculations:              Physical Exam:   VS:  BP 102/60 (BP Location: Left Arm, Patient Position: Sitting, Cuff Size: Small)   Pulse 71   Ht 5' 10 (1.778 m)   Wt 130 lb 12.8 oz (59.3 kg)   SpO2 98%   BMI 18.77 kg/m    Wt Readings from Last 3 Encounters:  05/19/24 130 lb 12.8 oz (59.3 kg)  05/05/24 127 lb (57.6 kg)  04/19/24 131 lb (59.4 kg)    GEN: Well nourished, well developed in no acute distress NECK: No JVD; No carotid bruits CARDIAC: RRR, no murmurs, rubs, gallops RESPIRATORY:  Clear to auscultation without rales, wheezing or rhonchi  ABDOMEN: Soft, non-tender, non-distended EXTREMITIES:  No edema; No acute deformity      Assessment and Plan:  PFO He has  been told by his ophthalmologist as he has some ischemic retinal changes He has also been told by his neurologist he is possibly having TIAs Echo bubble study 04/2024 showed bubble study positive with Valsalva suggesting small PFO RHC 05/2024 with normal hemodynamics and positive bubble study with Valsalva likely represents a small, hemodynamically insignificant PFO Admitted 04/2024 with stroke-like symptoms with possibility of complex migraine versus TIA versus possible demyelinating process versus other  - Today he reports ongoing exertional near syncope with 1 episode of syncope in the past.  On exertion or Valsalva (sitting on toilet or lifting objects) he experiences near syncope, visual changes (blurry vision), hypoxia, palpitations, and migraines with aura.  He also experiences intermittent right arm numbness and now  he has persistent left facial numbness and right foot numbness. - Plan to have him scheduled for treadmill stress test to monitor heart rhythm and evaluate cardiac response to exercise - Given his strokelike symptoms in the past and history of PFO, neurology has recommended TEE for further evaluation.  Spoke with Dr. Wonda who agreed to proceed with TEE for further evaluation of size of PFO - TEE ordered today.  Reviewed absolute and relative contradictions for TEE.  TEE scheduled for 9/25 with Dr. 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  Possibly attributed to vitamin E deficiency.  His vitamin D level was previously 0.3 but since has increased to 3.3.  Symptoms include burning, stabbing, cramping pain, and numbness in legs/arms/trunk and head.  The condition also affects functions like blood pressure and digestion Evaluated by EP on 7/31 symptoms were inconsistent with POTS and there was limited treatment options beyond pain management. - Pending treadmill stress test to evaluate cardiac response to exercise - Small fiber neuropathy is managed with [facility] neurology  Labile blood pressure He has history of labile blood pressures and has done poorly on certain therapies in the past - Blood pressure today well-controlled at 102/60   PACs, PVCs SVT ZIO 02/2024 showed less than 1% atrial ectopy and less than 1% ventricular ectopy with no arrhythmias detected.  Patient triggered events were isolated ectopic beats Was followed by EP and noted his rare PACs, PVCs and blocked PACs concurrent with neuropathy symptoms.  Arrhythmias were not believed to be primary cause of syncope or sensory symptoms When he underwent RHC on 9/3 he had a symptomatic run of SVT with rate 150-160.  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  His GI specialist recommends starting budesonide  to reduce stomach inflammation and improve nutrient absorption.  - CMET and magnesium today  History of testicular cancer in remission - Per last oncology note in 05/2023, repeat imaging study showed no evidence of disease and he does not need long-term follow-up  Informed Consent   Shared Decision Making/Informed Consent   The risks [esophageal damage, perforation (1:10,000 risk), bleeding, pharyngeal hematoma as well as other potential complications associated with conscious sedation including aspiration, arrhythmia, respiratory failure and death], benefits (treatment guidance and diagnostic support) and alternatives of a transesophageal echocardiogram were discussed in detail with Mr. Duris  and he is willing to proceed.          Dispo:  Return in about 6 weeks (around 06/30/2024).  Signed, Lum LITTIE Louis, NP

## 2024-05-19 NOTE — Patient Instructions (Addendum)
 Medication Instructions:  NO CHANGES   Lab Work: CMET AND MAGNESIUM TO BE DONE TODAY.   Testing/Procedures: TO BE SCHEDULED   Your physician has requested that you have an Exercise Stress Test. An exercise tolerance test is a test to check how your heart works during exercise. You will need to walk on a treadmill for this test. An electrocardiogram (ECG) will record your heartbeat when you are at rest and when you are exercising.    Please arrive 15 minutes prior to your appointment time for registration and insurance purposes.   The test will take approximately 45 minutes to complete.   How to prepare for your Exercise Stress Test: Do bring a list of your current medications with you.  If not listed below, you may take your medications as normal. Do wear comfortable clothes (no dresses or overalls) and walking shoes, tennis shoes preferred (no heels or open toed shoes are allowed) Do Not wear cologne, perfume, aftershave or lotions (deodorant is allowed). Do not drink or eat foods with caffeine for 24 hours before the test. (Chocolate, coffee, tea, or energy drinks) If you use an inhaler, bring it with you to the test. Do not smoke for 4 hours before the test.    If these instructions are not followed, your test will have to be rescheduled.   If you cannot keep your appointment, please provide 24 hours notification to our office, to avoid a possible $50 charge to your account.  Your physician has requested that you have a TEE. During a TEE, sound waves are used to create images of your heart. It provides your doctor with information about the size and shape of your heart and how well your heart's chambers and valves are working. In this test, a transducer is attached to the end of a flexible tube that's guided down your throat and into your esophagus (the tube leading from you mouth to your stomach) to get a more detailed image of your heart. You are not awake for the procedure. Please  see the instruction sheet given to you today. For further information please visit [email or web address]      Follow-Up: At [facility], you and your health needs are our priority.  As part of our continuing mission to provide you with exceptional heart care, our providers are all part of one team.  This team includes your primary Cardiologist (physician) and Advanced Practice Providers or APPs (Physician Assistants and Nurse Practitioners) who all work together to provide you with the care you need, when you need it.  Your next appointment:   KEEP July 08, 2024 APPOINTMENT WITH DR. HOCHREIN  Provider:   Lynwood Schilling, MD

## 2024-05-19 NOTE — Telephone Encounter (Signed)
 Called patient and got him scheduled for a TEE on next Wednesday May 27, 2024 with Dr. Mona. Patient is aware of date and knows that someone from office will call him with a time. He understood, had no questions, and was happy that we could get everything scheduled so quickly.

## 2024-05-20 ENCOUNTER — Encounter (HOSPITAL_COMMUNITY): Payer: Self-pay | Admitting: *Deleted

## 2024-05-20 ENCOUNTER — Ambulatory Visit: Payer: Self-pay | Admitting: Emergency Medicine

## 2024-05-20 LAB — COMPREHENSIVE METABOLIC PANEL WITH GFR
ALT: 16 IU/L (ref 0–44)
AST: 11 IU/L (ref 0–40)
Albumin: 4.7 g/dL (ref 4.1–5.1)
Alkaline Phosphatase: 75 IU/L (ref 47–123)
BUN/Creatinine Ratio: 23 — ABNORMAL HIGH (ref 9–20)
BUN: 20 mg/dL (ref 6–24)
Bilirubin Total: 0.9 mg/dL (ref 0.0–1.2)
CO2: 24 mmol/L (ref 20–29)
Calcium: 9.7 mg/dL (ref 8.7–10.2)
Chloride: 100 mmol/L (ref 96–106)
Creatinine, Ser: 0.88 mg/dL (ref 0.76–1.27)
Globulin, Total: 2.3 g/dL (ref 1.5–4.5)
Glucose: 97 mg/dL (ref 70–99)
Potassium: 4.6 mmol/L (ref 3.5–5.2)
Sodium: 139 mmol/L (ref 134–144)
Total Protein: 7 g/dL (ref 6.0–8.5)
eGFR: 109 mL/min/1.73 (ref >59)

## 2024-05-20 LAB — MAGNESIUM: Magnesium: 2.3 mg/dL (ref 1.6–2.3)

## 2024-05-26 NOTE — Progress Notes (Signed)
 Left detailed VM for  patient and instructed them to come at 1030  and to be NPO after 0000.  Medications reviewed and patient instructed on what to take.   Advised that patient have a ride home and someone to stay with them for 24 hours after the procedure.   Left number to call back if they have additional questions.

## 2024-05-27 ENCOUNTER — Encounter (HOSPITAL_COMMUNITY): Payer: Self-pay | Admitting: Cardiology

## 2024-05-27 ENCOUNTER — Encounter (HOSPITAL_COMMUNITY): Payer: Self-pay | Admitting: Anesthesiology

## 2024-05-27 ENCOUNTER — Encounter (HOSPITAL_COMMUNITY)
Admission: RE | Disposition: A | Discharge status: Home / Self Care | Payer: Self-pay | Source: Home / Self Care | Attending: Cardiology

## 2024-05-27 ENCOUNTER — Ambulatory Visit (HOSPITAL_COMMUNITY)
Admission: RE | Admit: 2024-05-27 | Discharge: 2024-05-27 | Disposition: A | Discharge status: Home / Self Care | Attending: Cardiology | Admitting: Cardiology

## 2024-05-27 ENCOUNTER — Ambulatory Visit (HOSPITAL_BASED_OUTPATIENT_CLINIC_OR_DEPARTMENT_OTHER)

## 2024-05-27 ENCOUNTER — Other Ambulatory Visit: Payer: Self-pay

## 2024-05-27 DIAGNOSIS — G629 Polyneuropathy, unspecified: Secondary | ICD-10-CM | POA: Insufficient documentation

## 2024-05-27 DIAGNOSIS — Q2112 Patent foramen ovale: Secondary | ICD-10-CM | POA: Diagnosis not present

## 2024-05-27 DIAGNOSIS — Z8547 Personal history of malignant neoplasm of testis: Secondary | ICD-10-CM | POA: Insufficient documentation

## 2024-05-27 DIAGNOSIS — R0989 Other specified symptoms and signs involving the circulatory and respiratory systems: Secondary | ICD-10-CM | POA: Insufficient documentation

## 2024-05-27 DIAGNOSIS — K2970 Gastritis, unspecified, without bleeding: Secondary | ICD-10-CM | POA: Diagnosis not present

## 2024-05-27 DIAGNOSIS — R002 Palpitations: Secondary | ICD-10-CM

## 2024-05-27 HISTORY — PX: TRANSESOPHAGEAL ECHOCARDIOGRAM (CATH LAB): EP1270

## 2024-05-27 LAB — ECHO TEE

## 2024-05-27 SURGERY — TRANSESOPHAGEAL ECHOCARDIOGRAM (TEE) (CATHLAB)
Anesthesia: Monitor Anesthesia Care

## 2024-05-27 MED ORDER — SODIUM CHLORIDE 0.9 % IV SOLN: INTRAVENOUS | Status: DC

## 2024-05-27 MED ORDER — BUTAMBEN-TETRACAINE-BENZOCAINE 2-2-14 % EX AERO
INHALATION_SPRAY | CUTANEOUS | Status: AC
  Filled 2024-05-27: qty 20

## 2024-05-27 NOTE — Progress Notes (Signed)
     Transesophageal Echocardiogram Note  Manuel [name] [id number] [birth year]  Procedure: Transesophageal Echocardiogram Indications: PFO  Procedure Details Consent: Obtained Time Out: Verified patient identification, verified procedure, site/side was marked, verified correct patient position, special equipment/implants available, Radiology Safety Procedures followed,  medications/allergies/relevent history reviewed, required imaging and test results available.  Performed  Medications:  Pt declined sedation; pharynx anesthetized with lidocaine  spray.  Normal LV function; no shunt noted with color doppler; positive saline microcavitation study with valsalva.    Complications: No apparent complications Patient did tolerate procedure well.  Redell Shallow, MD

## 2024-05-27 NOTE — H&P (Signed)
 Office Visit 05/19/2024 [facility] at [employer] [facility] [employer]. [facility] [address]    [place], [place] L, NP Cardiology PFO (patent foramen ovale) +5 more Dx Referred by Beam, Manuel POUR, MD Reason for Visit   Additional Documentation  Vitals: BP 102/60 (BP Location: Left Arm, Patient Position: Sitting, Cuff Size: Small)   Pulse 71   Ht 5' 10 (1.778 m)   Wt 59.3 kg   SpO2 98%   BMI 18.77 kg/m   BSA 1.71 m   Pain  7   Flowsheets: NEWS,   MEWS Score,   Vital Signs,   Anthropometrics,   Method of Visit,   Data  Encounter Info: Billing Info,   History,   Allergies,   Detailed Report   All Notes   Progress Notes by Manuel Lum CROME, NP at 05/19/2024 9:15 AM  Author: Rana Lum CROME, NP Author Type: Nurse Practitioner Filed: 05/19/2024  5:31 PM  Note Status: Signed Cosign: Cosign Not Required Encounter Date: 05/19/2024  Editor: Manuel Lum CROME, NP (Nurse Practitioner)             Expand All Collapse All  Cardiology Office Note:     Date:  05/19/2024  ID:  Manuel [name], DOB 1981/01/15, MRN [id number] PCP: Beam, Manuel POUR, MD  [facility] Providers Cardiologist:  Manuel Schilling, MD Electrophysiologist:  Manuel Sage, MD       Patient Profile:     Subjective Chief Complaint: Follow-up after right heart catheterization History of Present Illness:  Manuel [name] is a 43 y.o. male with visit-pertinent history of PVCs, PACs, PFO, autonomic dysfunction, history of testicular cancer s/p radiation in remission, small fiber neuropathy, seizures   Patient has very complex medical history including small fiber neuropathy.  He has significant autonomic dysfunction thought caused by neuropathy according to his neurologist.   Echocardiogram 10/2023 with LVEF 60 to 65%, no RWMA, normal diastolic parameters, RV function normal, trivial MR.  CT cardiac scoring 10/2023 showed coronary calcium score 0.  ZIO monitor 01/2024 showed  predominant rhythm sinus with less than 1% atrial ectopy and ventricular ectopy and no arrhythmias detected.  Echocardiogram bubble study on 04/2024 showed LVEF 60 to 65%, no RWMA, bubble study was not positive at rest but was positive with Valsalva suggesting small PFO.   He was admitted June 2025 with syncope.  He has autonomic dysfunction previously diagnosed at [facility].     He is seen for follow-up with EP on 04/01/2024.  His symptoms were inconsistent with POTS.  Noted to have limited treatment options beyond pain management.  Treadmill stress test was ordered to evaluate cardiac response during exercise.   He was last seen in clinic on 04/19/2024 by Dr. Schilling.  Patient is very symptomatic and has progressive exercise intolerance.  He reports dyspnea on exertion as well.  Reported he did not complete treadmill stress test as he does not think he can get on a treadmill.  He is losing his peripheral vision is actually seeing ophthalmology who thought he had some ischemic changes and has further testing planned.  Right heart catheterization was planned to evaluate PFO.  Prior cath is completed on 06/01/2024 showing positive bubble study with Valsalva likely represents a small hemodynamically insignificant PFO.     Discussed the use of AI scribe software for clinical note transcription with the patient, who gave verbal consent to proceed.   History of Present Illness Manuel [name] Manuel [name] is  a 43 year old male with a history of PFO and who presents with exertional syncope and numbness.   He has a patent foramen ovale (PFO) confirmed by echocardiogram bubble study. He experiences exertional syncope with significant oxygen  desaturation during activities like lifting or walking. Symptoms worsen when lying on his left side, with oxygen  saturation dropping to the 70s and 80s during near-syncope episodes.   He has ischemic retinal changes, with peripheral vision blackout and small pupils, attributed to  lack of oxygen  or blood flow. He experiences migraines with aura.   Persistent numbness in his face and right foot began after separate hospital events, with intermittent numbness and pain in his right arm. These symptoms have progressed in severity over the last couple of years.   He has a history of arrhythmias, including SVT, PACs, and PVCs, exacerbated by lying on his left side. He uses an Psychologist, educational to monitor his heart rhythm.   He denies any chest pains, orthopnea, PND, hematochezia, melena   Review of systems:  Please see the history of present illness. All other systems are reviewed and otherwise negative.   Objective Studies Reviewed:     EKG Interpretation Date/Time:                  Wednesday May 19 2024 09:14:30 EDT Ventricular Rate:         71 PR Interval:                 128 QRS Duration:             88 QT Interval:                 386 QTC Calculation:419 R Axis:                         84   Text Interpretation:Normal sinus rhythm Normal ECG When compared with ECG of 05-May-2024 07:23, No significant change was found Confirmed by Manuel [name] [phone]) on 05/19/2024 1:18:22 PM     Right heart catheterization on 05/05/2024 1. Normal hemodynamics.  2. High cardiac output (CI 4.5).  3. There is no evidence for a significant step-up in oxygen  saturation from SVC to PA.  4. Of note, patient had a symptomatic run of SVT (appeared to be atrial tachycardia, rate 150-160) when we entered the right atrium.  He received 2.5 mg IV metoprolol  but converted back to NSR on his own prior to medication.  He says he wakes up at night at times with the same symptoms.    Positive bubble study with Valsalva likely represents a small, hemodynamically insignificant PFO.    Echocardiogram bubble limited 04/12/2024  1. Left ventricular ejection fraction, by estimation, is 60 to 65%. The  left ventricle has normal function. The left ventricle has no regional  wall  motion abnormalities.   2. Right ventricular systolic function is normal. The right ventricular  size is normal.   3. The aortic valve is tricuspid.   4. Bubble study was not positive at rest but was positive with Valsalva,  suggesting small PFO.   5. A small pericardial effusion is present. The pericardial effusion is  localized near the right ventricle.   6. Limited echo for bubble study.    ZIO 01/14/2024 14 day monitor   Predominant rhythm: Sinus Sinus HR: 47 - 135 bpm, AVG 68 bpm   < 1% atrial ectopy <1% ventricular ectopy   Arrhythmia detected:  none   Patient triggered events: isolated ectopic beats   CT cardiac scoring 10/31/2023 IMPRESSION: 1. Coronary calcium score of 0.   2. Non-cardiac: See separate report from [facility].   Echocardiogram 10/23/2023  1. Left ventricular ejection fraction, by estimation, is 60 to 65%. The  left ventricle has normal function. The left ventricle has no regional  wall motion abnormalities. Left ventricular diastolic parameters were  normal.   2. Right ventricular systolic function is normal. The right ventricular  size is normal.   3. The mitral valve is normal in structure. Trivial mitral valve  regurgitation. No evidence of mitral stenosis.   4. The aortic valve is tricuspid. Aortic valve regurgitation is not  visualized. No aortic stenosis is present.   5. The inferior vena cava is normal in size with greater than 50%  respiratory variability, suggesting right atrial pressure of 3 mmHg.  Risk Assessment/Calculations:               Physical Exam:   VS:  BP 102/60 (BP Location: Left Arm, Patient Position: Sitting, Cuff Size: Small)   Pulse 71   Ht 5' 10 (1.778 m)   Wt 130 lb 12.8 oz (59.3 kg)   SpO2 98%   BMI 18.77 kg/m       Wt Readings from Last 3 Encounters:  05/19/24 130 lb 12.8 oz (59.3 kg)  05/05/24 127 lb (57.6 kg)  04/19/24 131 lb (59.4 kg)    GEN: Well nourished, well developed in no acute  distress NECK: No JVD; No carotid bruits CARDIAC: RRR, no murmurs, rubs, gallops RESPIRATORY:  Clear to auscultation without rales, wheezing or rhonchi  ABDOMEN: Soft, non-tender, non-distended EXTREMITIES:  No edema; No acute deformity        Assessment and Plan:  PFO He has been told by his ophthalmologist as he has some ischemic retinal changes He has also been told by his neurologist he is possibly having TIAs Echo bubble study 04/2024 showed bubble study positive with Valsalva suggesting small PFO RHC 05/2024 with normal hemodynamics and positive bubble study with Valsalva likely represents a small, hemodynamically insignificant PFO Admitted 04/2024 with stroke-like symptoms with possibility of complex migraine versus TIA versus possible demyelinating process versus other  - Today he reports ongoing exertional near syncope with 1 episode of syncope in the past.  On exertion or Valsalva (sitting on toilet or lifting objects) he experiences near syncope, visual changes (blurry vision), hypoxia, palpitations, and migraines with aura.  He also experiences intermittent right arm numbness and now he has persistent left facial numbness and right foot numbness. - Plan to have him scheduled for treadmill stress test to monitor heart rhythm and evaluate cardiac response to exercise - Given his strokelike symptoms in the past and history of PFO, neurology has recommended TEE for further evaluation.  Spoke with Dr. Wonda who agreed to proceed with TEE for further evaluation of size of PFO - TEE ordered today.  Reviewed absolute and relative contradictions for TEE.  TEE scheduled for 9/25 with Dr. 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  Possibly attributed to vitamin E deficiency.  His vitamin D level was previously 0.3 but since has increased to 3.3.  Symptoms  include burning, stabbing, cramping pain, and numbness in legs/arms/trunk and head.  The condition also affects functions like blood pressure and digestion Evaluated by EP  on 7/31 symptoms were inconsistent with POTS and there was limited treatment options beyond pain management. - Pending treadmill stress test to evaluate cardiac response to exercise - Small fiber neuropathy is managed with [facility] neurology   Labile blood pressure He has history of labile blood pressures and has done poorly on certain therapies in the past - Blood pressure today well-controlled at 102/60    PACs, PVCs SVT ZIO 02/2024 showed less than 1% atrial ectopy and less than 1% ventricular ectopy with no arrhythmias detected.  Patient triggered events were isolated ectopic beats Was followed by EP and noted his rare PACs, PVCs and blocked PACs concurrent with neuropathy symptoms.  Arrhythmias were not believed to be primary cause of syncope or sensory symptoms When he underwent RHC on 9/3 he had a symptomatic run of SVT with rate 150-160.  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  His GI specialist recommends starting budesonide  to reduce stomach inflammation and improve nutrient absorption.  - CMET and magnesium today   History of testicular cancer in remission - Per last oncology note in 05/2023, repeat imaging study showed no evidence of disease and he does not need long-term follow-up   Informed Consent Shared Decision Making/Informed Consent   The risks [esophageal damage, perforation (1:10,000 risk), bleeding, pharyngeal hematoma as well as other potential complications  associated with conscious sedation including aspiration, arrhythmia, respiratory failure and death], benefits (treatment guidance and diagnostic support) and alternatives of a transesophageal echocardiogram were discussed in detail with Mr. Eschbach and he is willing to proceed.            Dispo:  Return in about 6 weeks (around 06/30/2024).   Signed, Lum LITTIE Louis, NP       For TEE; no changes. Redell Shallow

## 2024-05-28 ENCOUNTER — Ambulatory Visit (HOSPITAL_COMMUNITY)
Admission: RE | Admit: 2024-05-28 | Discharge: 2024-05-28 | Disposition: A | Discharge status: Home / Self Care | Source: Ambulatory Visit | Attending: Internal Medicine | Admitting: Internal Medicine

## 2024-05-28 ENCOUNTER — Encounter (HOSPITAL_COMMUNITY): Payer: Self-pay | Admitting: Cardiology

## 2024-05-28 ENCOUNTER — Other Ambulatory Visit (HOSPITAL_COMMUNITY): Payer: Self-pay | Admitting: Emergency Medicine

## 2024-05-28 DIAGNOSIS — I471 Supraventricular tachycardia, unspecified: Secondary | ICD-10-CM | POA: Insufficient documentation

## 2024-05-28 LAB — EXERCISE TOLERANCE TEST
Angina Index: 0
Duke Treadmill Score: 9
Estimated workload: 10.7
Exercise duration (min): 9 min
Exercise duration (sec): 26 s
MPHR: 176 {beats}/min
Peak HR: 153 {beats}/min
Percent HR: 86 %
Rest HR: 78 {beats}/min
ST Depression (mm): 0 mm

## 2024-06-01 ENCOUNTER — Ambulatory Visit: Payer: Self-pay | Admitting: Emergency Medicine

## 2024-06-02 DIAGNOSIS — I471 Supraventricular tachycardia, unspecified: Secondary | ICD-10-CM | POA: Insufficient documentation

## 2024-06-02 DIAGNOSIS — R7989 Other specified abnormal findings of blood chemistry: Secondary | ICD-10-CM | POA: Diagnosis not present

## 2024-06-02 DIAGNOSIS — K909 Intestinal malabsorption, unspecified: Secondary | ICD-10-CM | POA: Diagnosis not present

## 2024-06-02 DIAGNOSIS — I10 Essential (primary) hypertension: Secondary | ICD-10-CM | POA: Insufficient documentation

## 2024-06-02 DIAGNOSIS — E46 Unspecified protein-calorie malnutrition: Secondary | ICD-10-CM | POA: Diagnosis not present

## 2024-06-02 DIAGNOSIS — K2960 Other gastritis without bleeding: Secondary | ICD-10-CM | POA: Diagnosis not present

## 2024-06-02 NOTE — Progress Notes (Unsigned)
 Cardiology Office Note:   Date:  06/02/2024  ID:  Manuel [name], DOB 17-Oct-1980, MRN [id number] PCP: Manuel [name], Manuel [name]  [facility] Providers Cardiologist:  Manuel [name], Manuel [name] Electrophysiologist:  Manuel [name], Manuel [name] {  History of Present Illness:   Manuel [name] [name] is a 43 y.o. male who had previously seen Dr. Inocencio for palpitations and LOC.  He was admitted June 2025 with syncope.  He has autonomic dysfunction previously diagnosed at [facility].  He has PVCs and PACs and a PFO.   He had no evidence of shunt on right heart cath in Sept.   He had a TEE and had a possible small clinically benign PFO.   He was to have a GXT after seeing Dr. Inocencio but he canceled this.    He previously saw Dr [name].  ***    . Normal hemodynamics.  2. High cardiac output (CI 4.5).  3. There is no evidence for a significant step-up in oxygen  saturation  from SVC to PA.  4. Of note, patient had a symptomatic run of SVT (appeared to be atrial  tachycardia, rate 150-160) when we entered the right atrium.  He received    He has had a diagnosis of small fiber neuropathy by biopsy that was sent to [facility].  He has seen dermatology, rheumatology, GI, neurology in [place].  He was treated for possible dysautonomia for years but does not think really that was his issue.  He had testicular cancer.  He continues to go downhill.  He is very symptomatic and says he has just progressive exercise intolerance.  He has numbness and tingling in cold sensation in his hands.  He has dyspnea on exertion.  He has lost progressive weight.  He is losing peripheral vision and is actually seeing ophthalmology who thought he had some ischemic changes and has further testing planned.  He did not think he could do a treadmill test.  He describes orthopnea and shows me oxygen  saturations are markedly different lying sitting and standing.   Of note his echocardiogram does demonstrate a PFO.    ROS: ***  Studies Reviewed:     EKG:       ***  Risk Assessment/Calculations:   {Does this patient have ATRIAL FIBRILLATION?:[id number]} No BP recorded.  {Refresh Note OR Click here to enter BP  :1}***        Physical Exam:   VS:  There were no vitals taken for this visit.   Wt Readings from Last 3 Encounters:  05/27/24 130 lb (59 kg)  05/19/24 130 lb 12.8 oz (59.3 kg)  05/05/24 127 lb (57.6 kg)     GEN: Well nourished, well developed in no acute distress NECK: No JVD; No carotid bruits CARDIAC: ***RR, *** murmurs, rubs, gallops RESPIRATORY:  Clear to auscultation without rales, wheezing or rhonchi  ABDOMEN: Soft, non-tender, non-distended EXTREMITIES:  No edema; No deformity   ASSESSMENT AND PLAN:   PFO:   ***   He has been told by his ophthalmologist as he has some ischemic retinal changes He has also been told by his neurologist he is possibly having TIAs Echo bubble study 04/2024 showed bubble study positive with Valsalva suggesting small PFO RHC 05/2024 with normal hemodynamics and positive bubble study with Valsalva likely represents a small, hemodynamically insignificant PFO Admitted 04/2024 with stroke-like symptoms with possibility of complex migraine versus TIA versus possible demyelinating process versus other  - Today he reports ongoing exertional near syncope with 1  episode of syncope in the past.  On exertion or Valsalva (sitting on toilet or lifting objects) he experiences near syncope, visual changes (blurry vision), hypoxia, palpitations, and migraines with aura.  He also experiences intermittent right arm numbness and now he has persistent left facial numbness and right foot numbness. - Plan to have him scheduled for treadmill stress test to monitor heart rhythm and evaluate cardiac response to exercise - Given his strokelike symptoms in the past and history of PFO, neurology has recommended TEE for further evaluation.  Spoke with Dr. Wonda who agreed to proceed with TEE for further evaluation  of size of PFO - TEE ordered today.  Reviewed absolute and relative contradictions for TEE.  TEE scheduled for 9/25 with Dr. 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  Possibly attributed to vitamin E deficiency.  His vitamin D level was previously 0.3 but since has increased to 3.3.  Symptoms include burning, stabbing, cramping pain, and numbness in legs/arms/trunk and head.  The condition also affects functions like blood pressure and digestion Evaluated by EP on 7/31 symptoms were inconsistent with POTS and there was limited treatment options beyond pain management. - Pending treadmill stress test to evaluate cardiac response to exercise - Small fiber neuropathy is managed with [facility] neurology   Labile blood pressure:  ***  He has history of labile blood pressures and has done poorly on certain therapies in the past - Blood pressure today well-controlled at 102/60    PACs, PVCs:  ***  SVT:  ***    ZIO 02/2024 showed less than 1% atrial ectopy and less than 1% ventricular ectopy with no arrhythmias detected.  Patient triggered events were isolated ectopic beats Was followed by EP and noted his rare PACs, PVCs and blocked PACs concurrent with neuropathy symptoms.  Arrhythmias were not believed to be primary cause of syncope or sensory symptoms When he underwent RHC on 9/3 he had a symptomatic run of SVT with rate 150-160.  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  His GI specialist recommends starting budesonide   to reduce stomach inflammation and improve nutrient absorption.  - CMET and magnesium today       Follow up ***  Signed, Manuel [name], Manuel [name]

## 2024-06-03 ENCOUNTER — Ambulatory Visit: Attending: Cardiology | Admitting: Cardiology

## 2024-06-03 ENCOUNTER — Encounter: Payer: Self-pay | Admitting: Cardiology

## 2024-06-03 VITALS — BP 110/68 | HR 76 | Ht 70.0 in | Wt 137.0 lb

## 2024-06-03 DIAGNOSIS — I471 Supraventricular tachycardia, unspecified: Secondary | ICD-10-CM

## 2024-06-03 DIAGNOSIS — Q2112 Patent foramen ovale: Secondary | ICD-10-CM

## 2024-06-03 DIAGNOSIS — I10 Essential (primary) hypertension: Secondary | ICD-10-CM

## 2024-06-03 NOTE — Patient Instructions (Addendum)
 Medication Instructions:  Your physician recommends that you continue on your current medications as directed. Please refer to the Current Medication list given to you today.  *If you need a refill on your cardiac medications before your next appointment, please call your pharmacy*  Lab Work: NONE If you have labs (blood work) drawn today and your tests are completely normal, you will receive your results only by: MyChart Message (if you have MyChart) OR A paper copy in the mail If you have any lab test that is abnormal or we need to change your treatment, we will call you to review the results.  Testing/Procedures: NONE  Follow-Up: At [facility], you and your health needs are our priority.  As part of our continuing mission to provide you with exceptional heart care, our providers are all part of one team.  This team includes your primary Cardiologist (physician) and Advanced Practice Providers or APPs (Physician Assistants and Nurse Practitioners) who all work together to provide you with the care you need, when you need it.  Your next appointment:   With Dr. Wonda in Structural clinic, then Dr. Lavona will decide when to see you next    We recommend signing up for the patient portal called MyChart.  Sign up information is provided on this After Visit Summary.  MyChart is used to connect with patients for Virtual Visits (Telemedicine).  Patients are able to view lab/test results, encounter notes, upcoming appointments, etc.  Non-urgent messages can be sent to your provider as well.   To learn more about what you can do with MyChart, go to [email or web address].   Other Instructions Referral to Dr. Wonda for PFO

## 2024-06-08 ENCOUNTER — Encounter: Payer: Self-pay | Admitting: Cardiovascular Disease

## 2024-06-08 ENCOUNTER — Ambulatory Visit: Attending: Cardiovascular Disease | Admitting: Cardiovascular Disease

## 2024-06-08 VITALS — BP 122/80 | HR 87 | Ht 70.0 in | Wt 136.6 lb

## 2024-06-08 DIAGNOSIS — Q2112 Patent foramen ovale: Secondary | ICD-10-CM

## 2024-06-08 DIAGNOSIS — I471 Supraventricular tachycardia, unspecified: Secondary | ICD-10-CM | POA: Diagnosis not present

## 2024-06-08 DIAGNOSIS — Z91041 Radiographic dye allergy status: Secondary | ICD-10-CM

## 2024-06-08 MED ORDER — DIPHENHYDRAMINE HCL 50 MG PO CAPS: ORAL_CAPSULE | ORAL | 0 refills | Status: DC

## 2024-06-08 MED ORDER — PREDNISONE 50 MG PO TABS: ORAL_TABLET | ORAL | 0 refills | Status: DC

## 2024-06-08 NOTE — Progress Notes (Signed)
 Cardiology Office Note:    Date:  06/08/2024   ID:  Manuel [name], DOB [birth year], MRN [id number]  PCP:  [name], MD   [facility] Providers Cardiologist:  Lynwood Schilling, MD Electrophysiologist:  Elspeth Sage, MD     Referring MD: Dorcus, Lamar POUR, MD   Chief Complaint  Patient presents with   Shortness of Breath    History of Present Illness:    Manuel [name] is a 43 y.o. male presenting for evaluation of PFO.  The patient has a history of autonomic dysfunction, PVCs, and PACs.  In reviewing the patient's record, he has had a multitude of symptoms.  He has been diagnosed with small fiber neuropathy, sinus pauses, and collagenous gastritis.  He has had presyncope and frank syncope in the past.  An echocardiogram in August of this year demonstrated a positive bubble study only with Valsalva.  His oxygen  saturation supine, sitting, and standing, was maintained at 98% during the study.  A transesophageal echo 05/27/2024 showed no shunting with color Doppler but positive microcavitation study with Valsalva cannot exclude small PFO.  The patient is here alone today.  He basically reports that he has gone from being a high-level athlete to having difficulty performing low-level physical activities such as walking short distances due to shortness of breath, weakness, and fatigue.  He reports a lot of different neurologic symptoms and has been under close care from neurology.  Patient reports that his oxygen  saturations often drop into the 70s with a positional component.  Interestingly, his saturations seem to be lowest when he is supine and the increase to above 90 when he is standing.  He states that the left lateral decubitus position is the worst position from a shortness of breath and hypoxemia standpoint.  He denies leg edema.  He has nonexertional chest pain at times.  He has had numbness in his hands and feet and he has had left facial numbness as  well.   Current Medications: Current Meds  Medication Sig   acetaminophen  (TYLENOL ) 500 MG tablet Take 1,000 mg by mouth every 6 (six) hours as needed for headache.   albuterol  (VENTOLIN  HFA) 108 (90 Base) MCG/ACT inhaler Inhale 2 puffs into the lungs every 6 (six) hours as needed for shortness of breath or wheezing.   Ascorbic Acid  (VITAMIN C) 125 MG CHEW Chew 125 mg by mouth daily at 12 noon.   Biotin 5000 MCG CAPS Take 5,000 mcg by mouth daily at 12 noon.   [Paused] budesonide  (ENTOCORT EC ) 3 MG 24 hr capsule Take 9 mg by mouth daily.   CHOLECALCIFEROL SL Place 1 tablet under the tongue 2 (two) times daily with a meal.   diphenhydrAMINE (BENADRYL) 50 MG capsule Take one capsule 1 hour prior to scan.   Ferrous Sulfate (BL IRON PO) Take 25 mg by mouth daily at 12 noon.   predniSONE  (DELTASONE ) 50 MG tablet Take one tablet 13 hours, 7 hours, and 1 hour prior to scan.   vitamin E 180 MG (400 UNITS) capsule Take 400 Units by mouth daily. Gamma 100 mg - Alpha   vitamin k 100 MCG tablet Take 100 mcg by mouth daily.     Allergies:   Codeine, Erythromycin, Iodine, Ioversol, Neomycin, Shellfish allergy, Azithromycin, Corn-containing products, Wheat, Doxycycline  hyclate, Dilaudid [hydromorphone hcl], Nsaids, Prednisone , and Tetracyclines & related   ROS:   Please see the history of present illness.    All other systems reviewed and are  negative.  EKGs/Labs/Other Studies Reviewed:    The following studies were reviewed today: Cardiac Studies & Procedures   ______________________________________________________________________________________________ CARDIAC CATHETERIZATION  CARDIAC CATHETERIZATION 05/05/2024  Conclusion 1. Normal hemodynamics. 2. High cardiac output (CI 4.5). 3. There is no evidence for a significant step-up in oxygen  saturation from SVC to PA. 4. Of note, patient had a symptomatic run of SVT (appeared to be atrial tachycardia, rate 150-160) when we entered the right  atrium.  He received 2.5 mg IV metoprolol  but converted back to NSR on his own prior to medication.  He says he wakes up at night at times with the same symptoms.  Positive bubble study with Valsalva likely represents a small, hemodynamically insignificant PFO.   STRESS TESTS  EXERCISE TOLERANCE TEST (ETT) 05/28/2024  Interpretation Summary   Good exercise capacity, achieved 10.7 METS   Peak heart rate 153 bpm, 86% max age-predicted heart rate   Normal blood pressure response to exercise   No stress EKG evidence of ischemia   Low risk study   ECHOCARDIOGRAM  ECHOCARDIOGRAM LIMITED BUBBLE STUDY 04/12/2024  Narrative ECHOCARDIOGRAM LIMITED REPORT    Patient Name:   Manuel [name] Date of Exam: 04/12/2024 Medical Rec #:  [id number]            Height:       70.0 in Accession #:    [id number]           Weight:       130.0 lb Date of Birth:  1980-10-12           BSA:          1.738 m Patient Age:    42 years             BP:           118/70 mmHg Patient Gender: M                    HR:           77 bpm. Exam Location:  [address]  Procedure: Limited Echo, 2D Echo, Limited Color Doppler and Saline Contrast Bubble Study (Both Spectral and Color Flow Doppler were utilized during procedure).  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 Arrythmias:PAC and Autonomic dysfunction; Signs/Symptoms:Syncope and Possible PFO. Previous echo revealed LVEF 65%.  Sonographer:    Nolon Berg [facility], RDCS Referring Phys: [phone] WILL MARTIN CAMNITZ   Sonographer Comments: O2 Saturation taken during exam-98% maintained standing, sitting and laying flat without change. Bubble study performed with Valsalva manuever, lying flat and sitting upright. IMPRESSIONS   1. Left ventricular ejection fraction, by estimation, is 60 to 65%. The left ventricle has normal function. The left ventricle has no regional wall motion abnormalities. 2.  Right ventricular systolic function is normal. The right ventricular size is normal. 3. The aortic valve is tricuspid. 4. Bubble study was not positive at rest but was positive with Valsalva, suggesting small PFO. 5. A small pericardial effusion is present. The pericardial effusion is localized near the right ventricle. 6. Limited echo for bubble study.  FINDINGS Left Ventricle: Left ventricular ejection fraction, by estimation, is 60 to 65%. The left ventricle has normal function. The left ventricle has no regional wall motion abnormalities. The left ventricular internal cavity size was normal in size. There is no left ventricular hypertrophy.  Right Ventricle: The right ventricular size is normal. No  increase in right ventricular wall thickness. Right ventricular systolic function is normal.  Left Atrium: Left atrial size was normal in size.  Right Atrium: Right atrial size was normal in size.  Pericardium: A small pericardial effusion is present. The pericardial effusion is localized near the right ventricle.  Aortic Valve: The aortic valve is tricuspid.  Pulmonic Valve: The pulmonic valve was normal in structure. Pulmonic valve regurgitation is not visualized.  Aorta: The aortic root is normal in size and structure.  IAS/Shunts: Bubble study was not positive at rest but was positive with Valsalva, suggesting small PFO. Agitated saline contrast was given intravenously to evaluate for intracardiac shunting.  LEFT VENTRICLE PLAX 2D LVIDd:         4.30 cm LVIDs:         2.05 cm LV PW:         0.80 cm LV IVS:        0.80 cm LVOT diam:     2.00 cm LVOT Area:     3.14 cm   LEFT ATRIUM         Index LA diam:    2.80 cm 1.61 cm/m  AORTA Ao Root diam: 2.90 cm Ao Asc diam:  2.90 cm   SHUNTS Systemic Diam: 2.00 cm  Dalton McleanMD Electronically signed by Ezra Kanner Signature Date/Time: 04/12/2024/5:28:04 PM    Final   TEE  ECHO TEE  05/27/2024  Narrative TRANSESOPHOGEAL ECHO REPORT    Patient Name:   Manuel [name] Date of Exam: 05/27/2024 Medical Rec #:  [id number]            Height:       70.0 in Accession #:    [id number]           Weight:       130.8 lb Date of Birth:  Apr 21, 1981           BSA:          1.743 m Patient Age:    42 years             BP:           127/91 mmHg Patient Gender: M                    HR:           97 bpm. Exam Location:  Outpatient  Procedure: Cardiac Doppler, Color Doppler and Saline Contrast Bubble Study (Both Spectral and Color Flow Doppler were utilized during procedure).  Indications:     PFO  History:         Patient has prior history of Echocardiogram examinations, most recent 04/12/2024. TIA; Signs/Symptoms:Syncope.  Sonographer:     Jayson Gaskins Referring Phys:  [id number] MADISON L FOUNTAIN Diagnosing Phys: Redell Shallow MD  PROCEDURE: After discussion of the risks and benefits of a TEE, an informed consent was obtained from the patient. The transesophogeal probe was passed without difficulty through the esophogus of the patient. Local oropharyngeal anesthetic was provided with Cetacaine . Sedation performed by performing physician. Image quality was good. The patient developed no complications during the procedure.  IMPRESSIONS   1. No shunting seen with color doppler; however positive saline microcavitation study with valsalva suggestive of PFO. 2. Left ventricular ejection fraction, by estimation, is 55 to 60%. The left ventricle has normal function. The left ventricle has no regional wall motion abnormalities. 3. Right ventricular systolic function is normal. The right ventricular size is normal. 4.  No left atrial/left atrial appendage thrombus was detected. 5. The mitral valve is normal in structure. Trivial mitral valve regurgitation. 6. The aortic valve is tricuspid. Aortic valve regurgitation is not visualized. No aortic stenosis is present. 7. Cannot exclude  a small PFO. Agitated saline contrast bubble study was positive with shunting observed within 3-6 cardiac cycles suggestive of interatrial shunt.  FINDINGS Left Ventricle: Left ventricular ejection fraction, by estimation, is 55 to 60%. The left ventricle has normal function. The left ventricle has no regional wall motion abnormalities. The left ventricular internal cavity size was normal in size. There is no left ventricular hypertrophy.  Right Ventricle: The right ventricular size is normal. Right ventricular systolic function is normal.  Left Atrium: Left atrial size was normal in size. No left atrial/left atrial appendage thrombus was detected.  Right Atrium: Right atrial size was normal in size.  Pericardium: Trivial pericardial effusion is present.  Mitral Valve: The mitral valve is normal in structure. Trivial mitral valve regurgitation.  Tricuspid Valve: The tricuspid valve is normal in structure. Tricuspid valve regurgitation is trivial.  Aortic Valve: The aortic valve is tricuspid. Aortic valve regurgitation is not visualized. No aortic stenosis is present.  Pulmonic Valve: The pulmonic valve was normal in structure. Pulmonic valve regurgitation is trivial.  Aorta: The aortic root is normal in size and structure. There is minimal (Grade I) plaque involving the descending aorta.  IAS/Shunts: Cannot exclude a small PFO. Agitated saline contrast was given intravenously to evaluate for intracardiac shunting. Agitated saline contrast bubble study was positive with shunting observed within 3-6 cardiac cycles suggestive of interatrial shunt.  Additional Comments: No shunting seen with color doppler; however positive saline microcavitation study with valsalva suggestive of PFO.  Redell Shallow MD Electronically signed by Redell Shallow MD Signature Date/Time: 05/27/2024/10:18:17 AM    Final  MONITORS  LONG TERM MONITOR (3-14 DAYS) 02/17/2024  Narrative 14 day  monitor  Predominant rhythm: Sinus Sinus HR: 47 - 135 bpm, AVG 68 bpm  < 1% atrial ectopy <1% ventricular ectopy  Arrhythmia detected: none  Patient triggered events: isolated ectopic beats   CT SCANS  CT CARDIAC SCORING (SELF PAY ONLY) 10/31/2023  Addendum 11/01/2023  7:56 PM ADDENDUM REPORT: 11/01/2023 19:53  EXAM: OVER-READ INTERPRETATION  CT CHEST  The following report is an over-read performed by radiologist Dr. Andrea Gasman of [facility] on 11/01/2023. This over-read does not include interpretation of cardiac or coronary anatomy or pathology. The coronary calcium score interpretation by the cardiologist is attached.  COMPARISON:  Chest CT 10/17/2022  FINDINGS: Vascular: No aortic atherosclerosis. The included aorta is normal in caliber.  Mediastinum/nodes: No adenopathy or mass. Unremarkable esophagus.  Lungs: Linear subsegmental atelectasis in the dependent left lower lobe. No pulmonary nodule. No pleural fluid. The included airways are patent.  Upper abdomen: No acute or unexpected findings.  Musculoskeletal: There are no acute or suspicious osseous abnormalities.  IMPRESSION: No acute or unexpected extracardiac findings.   Electronically Signed By: Andrea Gasman M.D. On: 11/01/2023 19:53  Narrative CLINICAL DATA:  Cardiovascular Disease Risk stratification  EXAM: Coronary Calcium Score  TECHNIQUE: A gated, non-contrast computed tomography scan of the heart was performed using 3mm slice thickness. Axial images were analyzed on a dedicated workstation. Calcium scoring of the coronary arteries was performed using the Agatston method.  FINDINGS: Coronary Calcium Score: Total: 0  Pericardium: Normal.  Ascending Aorta: 31 mm at the mid ascending aorta measured in an axial plane.  IMPRESSION: 1. Coronary calcium  score of 0.  2. Non-cardiac: See separate report from [facility].  RECOMMENDATIONS: Coronary artery  calcium (CAC) score is a strong predictor of incident coronary heart disease (CHD) and provides predictive information beyond traditional risk factors. CAC scoring is reasonable to use in the decision to withhold, postpone, or initiate statin therapy in intermediate-risk or selected borderline-risk asymptomatic adults (age 64-75 years and LDL-C >=70 to <190 mg/dL) who do not have diabetes or established atherosclerotic cardiovascular disease (ASCVD).* In intermediate-risk (10-year ASCVD risk >=7.5% to <20%) adults or selected borderline-risk (10-year ASCVD risk >=5% to <7.5%) adults in whom a CAC score is measured for the purpose of making a treatment decision the following recommendations have been made:  If CAC=0, it is reasonable to withhold statin therapy and reassess in 5 to 10 years, as long as higher risk conditions are absent (diabetes mellitus, family history of premature CHD in first degree relatives (males <55 years; females <65 years), cigarette smoking, or LDL >=190 mg/dL).  If CAC is 1 to 99, it is reasonable to initiate statin therapy for patients >=64 years of age.  If CAC is >=100 or >=75th percentile, it is reasonable to initiate statin therapy at any age.  Cardiology referral should be considered for patients with CAC scores >=400 or >=75th percentile.  *2018 AHA/ACC/AACVPR/AAPA/ABC/ACPM/ADA/AGS/APhA/ASPC/NLA/PCNA Guideline on the Management of Blood Cholesterol: A Report of the American College of Cardiology/American Heart Association Task Force on Clinical Practice Guidelines. J Am Coll Cardiol. 2019;73(24):3168-3209.  Madonna Large, DO, [facility]  Electronically Signed: By: Madonna Large D.O. On: 11/01/2023 15:09     ______________________________________________________________________________________________      EKG:   EKG Interpretation Date/Time:  Tuesday June 08 2024 14:53:28 EDT Ventricular Rate:  87 PR Interval:  130 QRS Duration:  86 QT  Interval:  370 QTC Calculation: 445 R Axis:   91  Text Interpretation: Normal sinus rhythm Right atrial enlargement Rightward axis Pulmonary disease pattern When compared with ECG of 19-May-2024 09:14, No significant change was found Confirmed by Wonda Sharper [phone]) on 06/08/2024 3:04:03 PM    Recent Labs: 02/26/2024: TSH 1.572 04/26/2024: Platelets 299 05/05/2024: Hemoglobin 13.6 05/19/2024: ALT 16; BUN 20; Creatinine, Ser 0.88; Magnesium 2.3; Potassium 4.6; Sodium 139  Recent Lipid Panel    Component Value Date/Time   CHOL 196 04/24/2012 0700   TRIG 55 04/24/2012 0700   HDL 64 04/24/2012 0700   CHOLHDL 3.1 04/24/2012 0700   VLDL 11 04/24/2012 0700   LDLCALC 121 (H) 04/24/2012 0700     Risk Assessment/Calculations:                Physical Exam:    VS:  BP 122/80   Pulse 87   Ht 5' 10 (1.778 m)   Wt 136 lb 9.6 oz (62 kg)   SpO2 97%   BMI 19.60 kg/m     Wt Readings from Last 3 Encounters:  06/08/24 136 lb 9.6 oz (62 kg)  06/03/24 137 lb (62.1 kg)  05/27/24 130 lb (59 kg)     GEN:  Well nourished, well developed in no acute distress HEENT: Normal NECK: No JVD; No carotid bruits LYMPHATICS: No lymphadenopathy CARDIAC: RRR, no murmurs, rubs, gallops RESPIRATORY:  Clear to auscultation without rales, wheezing or rhonchi  ABDOMEN: Soft, non-tender, non-distended MUSCULOSKELETAL:  No edema; No deformity  SKIN: Warm and dry NEUROLOGIC:  Alert and oriented x 3 PSYCHIATRIC:  Normal affect   Assessment & Plan PFO (patent foramen ovale) The patient has a small PFO.  I reviewed his 2D echo bubble study which shows few bubbles in the left heart with Valsalva.  The right heart is fully opacified with minimal shunting identified.  His transesophageal echo is also reviewed and he again has few bubbles in the left heart with Valsalva and I do not see any shunt at rest.  There is no color flow visualized across the interatrial septum.  He may have a small PFO but I do not see  a significant defect where PFO closure would be indicated.  In addition, he has had MRI studies of the brain which have shown no evidence of infarct or clinically significant abnormality.  We reviewed the pathophysiology of orthodeoxia platypnea syndrome and to my knowledge this has not been reported to occur in the supine position but not withstanding.  Orthodeoxia platypnea by definition he is hypoxemia and shortness of breath with standing, resolved with lying supine.  With late bubbles seen in the left heart, even though the shunt appears small, it is reasonable to proceed with a CTA of the chest to evaluate for pulmonary AVMs.  I do not know of any other anatomic reason for right-to-left shunting and positional hypoxemia.  I have also recommended a cardiopulmonary exercise test to objectively measure his oxygen  levels with exercise as well as exercise capacity and better define what his cardiopulmonary limitation could be related to. SVT (supraventricular tachycardia) Isolated occurrence with right heart catheterization, likely catheter related and clinically insignificant.  No SVT on follow-up monitoring. Contrast media allergy Reported history of allergy to iodinated contrast.  Will premedicate with prednisone  and Benadryl.  Patient reports some reaction to contrast even with premedication in the past.  We discussed risk/benefit of proceeding with a CT angiogram and he would like to proceed.      Medication Adjustments/Labs and Tests Ordered: Current medicines are reviewed at length with the patient today.  Concerns regarding medicines are outlined above.  Orders Placed This Encounter  Procedures   Cardiopulmonary exercise test   CT Chest W Contrast   EKG 12-Lead   Meds ordered this encounter  Medications   predniSONE  (DELTASONE ) 50 MG tablet    Sig: Take one tablet 13 hours, 7 hours, and 1 hour prior to scan.    Dispense:  3 tablet    Refill:  0   diphenhydrAMINE (BENADRYL) 50 MG  capsule    Sig: Take one capsule 1 hour prior to scan.    Dispense:  1 capsule    Refill:  0    Patient Instructions  Medication Instructions:  If the patient has contrast allergy: Patient will need a prescription for Prednisone  and very clear instructions (as follows): Prednisone  50 mg - take 13 hours prior to test Take another Prednisone  50 mg 7 hours prior to test Take another Prednisone  50 mg 1 hour prior to test Take Benadryl 50 mg 1 hour prior to test Patient must complete all four doses of above prophylactic medications. Patient will need a ride after test due to Benadryl.  *If you need a refill on your cardiac medications before your next appointment, please call your pharmacy*  Lab Work: None ordered today. If you have labs (blood work) drawn today and your tests are completely normal, you will receive your results only by: MyChart Message (if you have MyChart) OR A paper copy in the mail If you have any lab test that is abnormal or we need to change your treatment, we will call you to review the results.  Testing/Procedures: Your provider has requested that you have a CT of the chest with contrast. You will be pre-medicated prior to having the contrast administered. Non-Cardiac CT scanning, (CAT scanning), is a noninvasive, special x-ray that produces cross-sectional images of the body using x-rays and a computer. CT scans help physicians diagnose and treat medical conditions. For some CT exams, a contrast material is used to enhance visibility in the area of the body being studied. CT scans provide greater clarity and reveal more details than regular x-ray exams.  Your physician has recommended that you have a cardiopulmonary stress test (CPX). CPX testing is a non-invasive measurement of heart and lung function. It replaces a traditional treadmill stress test. This type of test provides a tremendous amount of information that relates not only to your present condition but  also for future outcomes. This test combines measurements of you ventilation, respiratory gas exchange in the lungs, electrocardiogram (EKG), blood pressure and physical response before, during, and following an exercise protocol.   Follow-Up: At [facility], you and your health needs are our priority.  As part of our continuing mission to provide you with exceptional heart care, our providers are all part of one team.  This team includes your primary Cardiologist (physician) and Advanced Practice Providers or APPs (Physician Assistants and Nurse Practitioners) who all work together to provide you with the care you need, when you need it.  Your next appointment:   As needed   Signed, Ozell Fell, MD  06/08/2024 5:21 PM    [facility]

## 2024-06-08 NOTE — Assessment & Plan Note (Signed)
 Isolated occurrence with right heart catheterization, likely catheter related and clinically insignificant.  No SVT on follow-up monitoring.

## 2024-06-08 NOTE — Assessment & Plan Note (Signed)
 The patient has a small PFO.  I reviewed his 2D echo bubble study which shows few bubbles in the left heart with Valsalva.  The right heart is fully opacified with minimal shunting identified.  His transesophageal echo is also reviewed and he again has few bubbles in the left heart with Valsalva and I do not see any shunt at rest.  There is no color flow visualized across the interatrial septum.  He may have a small PFO but I do not see a significant defect where PFO closure would be indicated.  In addition, he has had MRI studies of the brain which have shown no evidence of infarct or clinically significant abnormality.  We reviewed the pathophysiology of orthodeoxia platypnea syndrome and to my knowledge this has not been reported to occur in the supine position but not withstanding.  Orthodeoxia platypnea by definition he is hypoxemia and shortness of breath with standing, resolved with lying supine.  With late bubbles seen in the left heart, even though the shunt appears small, it is reasonable to proceed with a CTA of the chest to evaluate for pulmonary AVMs.  I do not know of any other anatomic reason for right-to-left shunting and positional hypoxemia.  I have also recommended a cardiopulmonary exercise test to objectively measure his oxygen  levels with exercise as well as exercise capacity and better define what his cardiopulmonary limitation could be related to.

## 2024-06-08 NOTE — Patient Instructions (Addendum)
 Medication Instructions:  If the patient has contrast allergy: Patient will need a prescription for Prednisone  and very clear instructions (as follows): Prednisone  50 mg - take 13 hours prior to test Take another Prednisone  50 mg 7 hours prior to test Take another Prednisone  50 mg 1 hour prior to test Take Benadryl 50 mg 1 hour prior to test Patient must complete all four doses of above prophylactic medications. Patient will need a ride after test due to Benadryl.  *If you need a refill on your cardiac medications before your next appointment, please call your pharmacy*  Lab Work: None ordered today. If you have labs (blood work) drawn today and your tests are completely normal, you will receive your results only by: MyChart Message (if you have MyChart) OR A paper copy in the mail If you have any lab test that is abnormal or we need to change your treatment, we will call you to review the results.  Testing/Procedures: Your provider has requested that you have a CT of the chest with contrast. You will be pre-medicated prior to having the contrast administered. Non-Cardiac CT scanning, (CAT scanning), is a noninvasive, special x-ray that produces cross-sectional images of the body using x-rays and a computer. CT scans help physicians diagnose and treat medical conditions. For some CT exams, a contrast material is used to enhance visibility in the area of the body being studied. CT scans provide greater clarity and reveal more details than regular x-ray exams.  Your physician has recommended that you have a cardiopulmonary stress test (CPX). CPX testing is a non-invasive measurement of heart and lung function. It replaces a traditional treadmill stress test. This type of test provides a tremendous amount of information that relates not only to your present condition but also for future outcomes. This test combines measurements of you ventilation, respiratory gas exchange in the lungs,  electrocardiogram (EKG), blood pressure and physical response before, during, and following an exercise protocol.   Follow-Up: At [facility], you and your health needs are our priority.  As part of our continuing mission to provide you with exceptional heart care, our providers are all part of one team.  This team includes your primary Cardiologist (physician) and Advanced Practice Providers or APPs (Physician Assistants and Nurse Practitioners) who all work together to provide you with the care you need, when you need it.  Your next appointment:   As needed

## 2024-06-15 ENCOUNTER — Encounter: Payer: Self-pay | Admitting: Cardiovascular Disease

## 2024-06-22 DIAGNOSIS — K2960 Other gastritis without bleeding: Secondary | ICD-10-CM | POA: Diagnosis not present

## 2024-06-22 DIAGNOSIS — R634 Abnormal weight loss: Secondary | ICD-10-CM | POA: Diagnosis not present

## 2024-06-29 ENCOUNTER — Emergency Department (HOSPITAL_COMMUNITY)

## 2024-06-29 ENCOUNTER — Observation Stay (HOSPITAL_COMMUNITY)
Admission: EM | Admit: 2024-06-29 | Discharge: 2024-07-01 | Disposition: A | Discharge status: Home / Self Care | Attending: Emergency Medicine | Admitting: Emergency Medicine

## 2024-06-29 DIAGNOSIS — I519 Heart disease, unspecified: Secondary | ICD-10-CM

## 2024-06-29 DIAGNOSIS — R55 Syncope and collapse: Principal | ICD-10-CM

## 2024-06-29 DIAGNOSIS — R4182 Altered mental status, unspecified: Secondary | ICD-10-CM | POA: Diagnosis not present

## 2024-06-29 DIAGNOSIS — G9340 Encephalopathy, unspecified: Secondary | ICD-10-CM | POA: Diagnosis not present

## 2024-06-29 DIAGNOSIS — Z79899 Other long term (current) drug therapy: Secondary | ICD-10-CM | POA: Diagnosis not present

## 2024-06-29 DIAGNOSIS — R410 Disorientation, unspecified: Secondary | ICD-10-CM | POA: Diagnosis not present

## 2024-06-29 DIAGNOSIS — T7840XA Allergy, unspecified, initial encounter: Secondary | ICD-10-CM | POA: Diagnosis not present

## 2024-06-29 LAB — URINALYSIS, ROUTINE W REFLEX MICROSCOPIC
Bilirubin Urine: NEGATIVE
Glucose, UA: NEGATIVE mg/dL
Hgb urine dipstick: NEGATIVE
Ketones, ur: NEGATIVE mg/dL
Leukocytes,Ua: NEGATIVE
Nitrite: NEGATIVE
Protein, ur: NEGATIVE mg/dL
Specific Gravity, Urine: 1.004 — ABNORMAL LOW (ref 1.005–1.030)
pH: 7 (ref 5.0–8.0)

## 2024-06-29 LAB — COMPREHENSIVE METABOLIC PANEL WITH GFR
ALT: 20 U/L (ref 0–44)
AST: 23 U/L (ref 15–41)
Albumin: 4.2 g/dL (ref 3.5–5.0)
Alkaline Phosphatase: 66 U/L (ref 38–126)
Anion gap: 10 (ref 5–15)
BUN: 25 mg/dL — ABNORMAL HIGH (ref 6–20)
CO2: 25 mmol/L (ref 22–32)
Calcium: 9.1 mg/dL (ref 8.9–10.3)
Chloride: 101 mmol/L (ref 98–111)
Creatinine, Ser: 0.96 mg/dL (ref 0.61–1.24)
GFR, Estimated: >60 mL/min
Glucose, Bld: 102 mg/dL — ABNORMAL HIGH (ref 70–99)
Potassium: 4.4 mmol/L (ref 3.5–5.1)
Sodium: 136 mmol/L (ref 135–145)
Total Bilirubin: 1.3 mg/dL — ABNORMAL HIGH (ref 0.0–1.2)
Total Protein: 7.4 g/dL (ref 6.5–8.1)

## 2024-06-29 LAB — RAPID URINE DRUG SCREEN, HOSP PERFORMED
Amphetamines: NOT DETECTED
Barbiturates: NOT DETECTED
Benzodiazepines: NOT DETECTED
Cocaine: NOT DETECTED
Opiates: NOT DETECTED
Tetrahydrocannabinol: NOT DETECTED

## 2024-06-29 LAB — CBC
HCT: 45.9 % (ref 39.0–52.0)
Hemoglobin: 15 g/dL (ref 13.0–17.0)
MCH: 28.8 pg (ref 26.0–34.0)
MCHC: 32.7 g/dL (ref 30.0–36.0)
MCV: 88.1 fL (ref 80.0–100.0)
Platelets: 305 K/uL (ref 150–400)
RBC: 5.21 MIL/uL (ref 4.22–5.81)
RDW: 12.4 % (ref 11.5–15.5)
WBC: 7.5 K/uL (ref 4.0–10.5)
nRBC: 0 % (ref 0.0–0.2)

## 2024-06-29 LAB — MAGNESIUM: Magnesium: 2.3 mg/dL (ref 1.7–2.4)

## 2024-06-29 LAB — AMMONIA: Ammonia: 18 umol/L (ref 9–35)

## 2024-06-29 LAB — ETHANOL: Alcohol, Ethyl (B): <15 mg/dL (ref <15)

## 2024-06-29 LAB — CBG MONITORING, ED: Glucose-Capillary: 97 mg/dL (ref 70–99)

## 2024-06-29 MED ORDER — ENOXAPARIN SODIUM 40 MG/0.4ML IJ SOSY
40.0000 mg | PREFILLED_SYRINGE | INTRAMUSCULAR | Status: DC
  Filled 2024-06-29: qty 0.4

## 2024-06-29 NOTE — ED Notes (Signed)
 Report given to [facility] in yellow.

## 2024-06-29 NOTE — ED Triage Notes (Signed)
 PT BIB to triage. Put is hodling an emesis basis. Pt is not answering questions. Pt states he is feeling confused and wants to lay down.   Wife states pt was not feeling well this morning and had a syncopal episode this morning.

## 2024-06-29 NOTE — Consult Note (Addendum)
 Cardiology Consultation   Patient ID: Manuel SANJUAN [name] MRN: [id number]; DOB: [birth year]  Admit date: 06/29/2024 Date of Consult: 06/29/2024  PCP:  [name], MD   [facility] Providers Cardiologist:  Lynwood Schilling, MD  Patient Profile: Manuel [name] is a 43 y.o. male with a hx of  PFO, autonomic dysfunction, testicular cancer status post radiation, collagenous gastritis, small fiber neuropathy, seizure disorder, migraines, seizures, viral meningitis  who is being seen 06/29/2024 for the evaluation of syncope at the request of ED.  History of Present Illness: Manuel [name] has a long and extensive history of small fiber neuropathy with multiple encounters over several years with neurological complaints and episodes of presyncope/syncope in the past.  Previous echocardiograms have demonstrated preserved biventricular function with no significant structural disease.  CT scoring 10/2023 was 0.  Heart monitor 01/2024 with predominantly sinus rhythm with less than 1% ectopy, although he had reported being symptomatic to blocked PACs and other related ectopy.  Eventually he has an echocardiogram earlier this year in August that demonstrated positive bubble study only with Valsalva.  He maintained oxygen  saturations throughout supine, sitting, standing.  He had further evaluation with a TEE 05/27/2024 that showed no shunting with color Doppler, positive microcavitation study with Valsalva (cannot exclude small PFO.  Also had right heart catheterization during the same month with normal hemodynamics, no evidence of significant stepup in oxygen  saturation from SVC to PA.  Did have 1 run of symptomatic SVT which responded to IV metoprolol  and converted back to sinus rhythm.  PFO on that study as well did not show hemodynamically significance.  He was referred to Dr. Wonda for further evaluation of his PFO 06/2024.  In summary he did not think that PFO was significant.  He only had a  few bubbles in the left heart with Valsalva I did not see any shunting at rest.  There was no color flow visualized across the interatrial septum.  Possibly may have PFO but no indications for PFO closure as it was felt to be insignificant.  Also with multiple prior MRI studies in the past showing no evidence of infarct or significant abnormalities.  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  Dr. Wonda ordered CT of the chest to evaluate for pulmonary AVMs and ordered cardiopulmonary exercise test.  Previous ETT test demonstrated excellent functional capacity 10.7 METS  Today patient presents to the emergency room after an episode of AMS.  Reportedly he was talking on the computer with a client and suddenly had significant confusion, pain in his face and was found on the floor unable to talk and overall feeling very fatigued.  Neurology was consulted and have started performing 24-hour EEG.  He does have known history of migraine with aura in the past.  MRI not performed but CT imaging was normal.  Labs and other imaging have been otherwise normal.  Patient is accompanied with his wife.  He feels very frustrated with his recurrent hospitalizations and recurrent symptoms.  He reports he was overall feeling well yesterday and better than he had in multiple days.  Reports approximately 30 minutes ago he just suddenly regained his composure and able to now talk.  Reported some pain in his face and pain in his feet.  He wonders about micro embolism.  In discussing his results with him, he reports he had been told he had a wasp nest of bubbles noted on echocardiogram.  Reported that his shunting was more moderate  and he had a tunneled appearance that prevented it from being surgically corrected and reported right left shunting.  We reviewed Dr. Margurite notes together verbatim.   Past Medical History:  Diagnosis Date   Bilateral flank pain 07/17/2012   Complication of lumbar puncture 10/05/2015    Concussion    pt reports he has hx of multiple concussions, last one was 2009   Headache 10/05/2015   Hypotension    Left sided numbness 04/24/2012   Migraine    Nausea without vomiting 10/05/2015   POTS (postural orthostatic tachycardia syndrome)    Seizure (HCC)    Subacute confusional state 10/05/2015   Syncope 04/24/2012   Testicular cancer (HCC) 2006   Traumatic CSF leak 10/05/2015   Viral meningitis 04/08/2014   Vision changes 10/05/2015    Past Surgical History:  Procedure Laterality Date   BIOPSY  02/11/2023   Procedure: BIOPSY;  Surgeon: Rosalie Kitchens, MD;  Location: [facility];  Service: Gastroenterology;;   ESOPHAGOGASTRODUODENOSCOPY N/A 02/11/2023   Procedure: ESOPHAGOGASTRODUODENOSCOPY (EGD);  Surgeon: Rosalie Kitchens, MD;  Location: [facility];  Service: Gastroenterology;  Laterality: N/A;   ESOPHAGOGASTRODUODENOSCOPY N/A 04/06/2024   Procedure: EGD (ESOPHAGOGASTRODUODENOSCOPY);  Surgeon: Rosalie Kitchens, MD;  Location: [facility];  Service: Gastroenterology;  Laterality: N/A;  no sedation   ORCHIECTOMY     RIGHT HEART CATH N/A 05/05/2024   Procedure: RIGHT HEART CATH;  Surgeon: Rolan Ezra RAMAN, MD;  Location: [facility] INVASIVE CV LAB;  Service: Cardiovascular;  Laterality: N/A;   ROTATOR CUFF REPAIR     SURGERY SCROTAL / TESTICULAR  2006   TRANSESOPHAGEAL ECHOCARDIOGRAM (CATH LAB) N/A 05/27/2024   Procedure: TRANSESOPHAGEAL ECHOCARDIOGRAM;  Surgeon: Pietro Redell RAMAN, MD;  Location: [facility] INVASIVE CV LAB;  Service: Cardiovascular;  Laterality: N/A;     Scheduled Meds:  enoxaparin  (LOVENOX ) injection  40 mg Subcutaneous Q24H   Continuous Infusions:  PRN Meds:   Allergies:    Allergies  Allergen Reactions   Codeine Nausea And Vomiting   Ery-Tab [Erythromycin] Anaphylaxis, Hives and Itching   Iodine Anaphylaxis and Shortness Of Breath   Neomycin Anaphylaxis, Hives and Itching   Optiray [Ioversol] Anaphylaxis   Shellfish Allergy Anaphylaxis   Corn-Containing Products Nausea And  Vomiting and Swelling   Wheat Nausea And Vomiting   Zithromax [Azithromycin] Hives   Vibramycin  [Doxycycline  Hyclate] Nausea And Vomiting   Dilaudid [Hydromorphone Hcl] Nausea And Vomiting   Nsaids Swelling and Palpitations   Prednisone  Nausea And Vomiting and Anxiety   Tetracyclines & Related Rash    Social History:   Social History   Socioeconomic History   Marital status: Married    Spouse name: Alan   Number of children: 2   Years of education: 16   Highest education level: Not on file  Occupational History   Occupation: airline pilot  Tobacco Use   Smoking status: Never   Smokeless tobacco: Never  Substance and Sexual Activity   Alcohol use: Not Currently   Drug use: No   Sexual activity: Not on file  Other Topics Concern   Not on file  Social History Narrative   Lives with wife and kids   Caffeine use: none   Social Drivers of Health   Financial Resource Strain: Patient Declined (09/16/2023)   Received from [facility]   Overall Financial Resource Strain (CARDIA)    Difficulty of Paying Living Expenses: Patient declined  Food Insecurity: Low Risk  (06/22/2024)   Received from [facility]   Hunger Vital Sign    Within  the past 12 months, you worried that your food would run out before you got money to buy more: Never true    Within the past 12 months, the food you bought just didn't last and you didn't have money to get more. : Never true  Transportation Needs: No Transportation Needs (06/22/2024)   Received from [facility]    In the past 12 months, has lack of reliable transportation kept you from medical appointments, meetings, work or from getting things needed for daily living? : No  Physical Activity: Sufficiently Active (09/16/2023)   Received from [facility]   Exercise Vital Sign    On average, how many days per week do you engage in moderate to strenuous exercise (like a brisk walk)?: 3 days    On average, how many minutes do you  engage in exercise at this level?: 60 min  Stress: Stress Concern Present (09/16/2023)   Received from [facility] - Occupational Stress Questionnaire    Feeling of Stress : To some extent  Social Connections: Moderately Integrated (09/16/2023)   Received from [facility]   Social Network    How would you rate your social network (family, work, friends)?: Adequate participation with social networks  Intimate Partner Violence: Not At Risk (02/25/2024)   Humiliation, Afraid, Rape, and Kick questionnaire    Fear of Current or Ex-Partner: No    Emotionally Abused: No    Physically Abused: No    Sexually Abused: No    Family History:   Family History  Problem Relation Age of Onset   Migraines Mother    Diabetes type II Father      ROS:  Please see the history of present illness. All other ROS reviewed and negative.     Physical Exam/Data: Vitals:   06/29/24 1230 06/29/24 1245 06/29/24 1300 06/29/24 1600  BP:   104/68 113/70  Pulse: 77 73 73 69  Resp: 11 11 14 10   Temp:    98 F (36.7 C)  TempSrc:    Oral  SpO2: 97% 97% 96% 98%   No intake or output data in the 24 hours ending 06/29/24 1737    06/08/2024    2:57 PM 06/03/2024    9:18 AM 05/27/2024    7:57 AM  Last 3 Weights  Weight (lbs) 136 lb 9.6 oz 137 lb 130 lb  Weight (kg) 61.961 kg 62.143 kg 58.968 kg     There is no height or weight on file to calculate BMI.  General:  Well nourished, well developed, in no acute distress. EEG in place.  HEENT: normal Neck: no JVD Vascular: No carotid bruits; Distal pulses 2+ bilaterally Cardiac:  normal S1, S2; RRR; no murmur  Lungs:  clear to auscultation bilaterally, no wheezing, rhonchi or rales  Abd: soft, nontender, no hepatomegaly  Ext: no edema Musculoskeletal:  No deformities, BUE and BLE strength normal and equal Skin: warm and dry  Neuro:  CNs 2-12 intact, no focal abnormalities noted Psych:  Normal affect   EKG:  The  EKG was personally reviewed and demonstrates: Sinus rhythm, heart rate 92.  Wandering baseline. Telemetry:  Telemetry was personally reviewed and demonstrates: Sinus rhythm heart rate 70-80  Relevant CV Studies:   Laboratory Data: High Sensitivity Troponin:  No results for input(s): TROPONINIHS in the last 720 hours.   Chemistry Recent Labs  Lab 06/29/24 1204  NA 136  K 4.4  CL 101  CO2 25  GLUCOSE 102*  BUN 25*  CREATININE 0.96  CALCIUM 9.1  GFRNONAA >60  ANIONGAP 10    Recent Labs  Lab 06/29/24 1204  PROT 7.4  ALBUMIN 4.2  AST 23  ALT 20  ALKPHOS 66  BILITOT 1.3*   Lipids No results for input(s): CHOL, TRIG, HDL, LABVLDL, LDLCALC, CHOLHDL in the last 168 hours.  Hematology Recent Labs  Lab 06/29/24 1204  WBC 7.5  RBC 5.21  HGB 15.0  HCT 45.9  MCV 88.1  MCH 28.8  MCHC 32.7  RDW 12.4  PLT 305   Thyroid  No results for input(s): TSH, FREET4 in the last 168 hours.  BNPNo results for input(s): BNP, PROBNP in the last 168 hours.  DDimer No results for input(s): DDIMER in the last 168 hours.  Radiology/Studies:  CT Head Wo Contrast Result Date: 06/29/2024 EXAM: CT HEAD WITHOUT CONTRAST 06/29/2024 01:10:50 PM TECHNIQUE: CT of the head was performed without the administration of intravenous contrast. Automated exposure control, iterative reconstruction, and/or weight based adjustment of the mA/kV was utilized to reduce the radiation dose to as low as reasonably achievable. COMPARISON: 04/26/2024 CLINICAL HISTORY: altered LOC. CT Head Wo Contrast; altered LOC ; Wife states when pt woke up around 9am not feeling well with feeling of neuropathy pain and felt like heart was racing. Wife went to start laundry and returned 2 minutes later and found pt on ground. No sign of injury. Wife did not hear and fall and ; does not think he hurt himself. Pt is not on any blood thinners. Pt was confused and not speaking much when wife got to him. He complained of  nausea and loss of vision. Vision has been going in and out. FINDINGS: BRAIN AND VENTRICLES: No acute hemorrhage. No evidence of acute infarct. No hydrocephalus. No extra-axial collection. No mass effect or midline shift. ORBITS: No acute abnormality. SINUSES: Mild mucosal thickening in ethmoid air cells. SOFT TISSUES AND SKULL: No acute soft tissue abnormality. No skull fracture. IMPRESSION: 1. No acute intracranial abnormality. 2. Mild mucosal thickening in the ethmoid air cells. Electronically signed by: Donnice Mania MD 06/29/2024 01:36 PM EDT RP Workstation: [id number]     Assessment and Plan:  AMS Autonomic dysfunction Patient has extensive history of neurological events and with recurrent admissions without obvious etiologies.  Here with an episode where he became acutely confused and unable to talk with face pain and leg pain.  His current presentation is not suggestive of any cardiac related etiologies.  There has been question if his PFO has any clinical significance.  He has seen Dr. Wonda our structural specialist for this.  His TEE and his right heart catheterization do not suggest that this is hemodynamically significant.  There was no color flow visualized across the interarterial septum.  Few bubbles appreciated.  MRIs have not shown any evidence of infarct or any significant abnormalities.  He has had extensive cardiac workup without any clinically significant abnormalities that would explain his recurrent presentations.  Neurological related etiologies I think are more likely.  Neurology is following and doing an EEG for this.  It is possible that he has autonomic dysfunction and we lack an appropriate diagnosis to explain this but again cardiac related etiologies driving this seems unlikely. Dr. Wonda had suggested CPX and CTA of the chest to rule out pulmonary AVMs, this has already been arranged outpatient. Of note he does have iodine related allergy and will need to be  premedicated.   Risk Assessment/Risk  Scores:   For questions or updates, please contact [facility] Please consult [email or web address] for contact info under      Signed, Thom LITTIE Sluder, PA-C  06/29/2024 5:37 PM   Mr. Isadore was seen by me today along with Thom Sluder, PA-C I have personally performed an evaluation on this patient.  My findings are as follows: 43 yo male with long standing history of autonomic dysfunction known to have small PFO presenting to the ED after a neurological event this am when he fell to the floor and could not speak.  No chest pain or palpitations.  He has been in sinus on telemetry. BP has been stable.  He has been seen by Neurology today.  Extensive cardiac workup in the past. TEE this year with normal LV function and very small PFO with no significant right to left shunting per the extensive note in the chart per Dr. Wonda on 06/08/24 when he saw Mr. Kersh in the structural heart clinic.   Data: EKG(s) and pertinent labs, studies, etc were personally reviewed and interpreted by me:  EKG sinus Tele: sinus Labs reviewed Otherwise, I agree with data as outlined by the advanced practice provider.  Exam performed by me: Gen: NAD Neck: No JVD Cardiac: RRR no murmurs Lungs: clear bilaterally Extremities: no LE edema  My Assessment and Plan:  Syncope: This does not seem to be cardiac related. He has had an extensive cardiac workup recently. Dr. Wonda is our ASA/PFO specialist and in a recent office visit, he did not feel that the patient's neurological symptoms were related to his small PFO.  Agree with watching on telemetry overnight.  Neurology has a 24 hour EEG in place.   Most likely will not push toward any other cardiac workup while he is admitted.   Planning for chest CTA later this week to exclude pulmonary AVM (per Dr. Wonda) and cardiopulmonary stress test to assess oxygen  levels with exercise. These tests can be done as an  outpatient.   Lonni Cash, MD, [facility] 06/29/2024 6:22 PM

## 2024-06-29 NOTE — ED Notes (Signed)
 Attempted to call charge 3W RN and the actual floor with no answer to notify patietn coming up

## 2024-06-29 NOTE — Assessment & Plan Note (Deleted)
-   Neurology consulted by ED, appreciate recommendations - Overnight EEG - Cardiology consulted, appreciate recommendations - Ordered Vitamin B12, Vitamin B1, folate, RPR, TSH - AM BMP, CBC - Mg pending - Fall and seizure precautions - Continuous cardiac monitoring

## 2024-06-29 NOTE — ED Provider Notes (Signed)
 [place] EMERGENCY DEPARTMENT AT [facility] Provider Note   CSN: [id number] Arrival date & time: 06/29/24  1126     Patient presents with: Loss of Consciousness   Manuel [name] is a 43 y.o. male.   Patient with complex past medical history (see below) -- presents for altered mental status.  Patient was in his normal state of health yesterday.  Reported from family member, was a little more lethargic with allergic last night but no focal symptoms.  Patient was found by family member today, collapsed on the floor.  Reported headache at times, reported loss of vision.  He had increased generalized pain from neuropathy.  Reported to RN that felt like heart was racing.  No sign of obvious injuries.  Family member did not hear the patient fall.  Patient has been minimally conversant, willing to answer basic questions at times, but is not peaking fluently like typical.  He was feeling nauseous, presents with emesis basin.  Family member reports question of previous stroke however no strokes noted on imaging.  He has left-sided facial and upper extremity numbness at baseline.  Neuro: small fiber neuropathy, dx biopsy; complex migraine  GI: collagenous colitis, H. Pylori (recently completed therapy) gastritis Cardiac: Small PFO not thought to require closure (Dr. Wonda 06/08/24), SOB, sinus pauses/syncope, SVT (only with cardiac cath), POTS, R Heart cath (05/05/2024) GU: h/x testicular ca s/p radiation  Imaging:  04/26/24: MRA head/neck was negative 04/26/24: MRI brain was negative 04/26/24: CT head code stroke was negative 03/07/24: MRA head/neck was negative 03/06/24: MRI brain was negative 03/06/24: MRI cervical spine small right paracentral disc protrusion at C3-4 without significant stenosis or impingement 03/06/24: CT head negative 02/25/24: CT head negative 12/20/23: CT head negative       Prior to Admission medications   Medication Sig Start Date End Date Taking?  Authorizing Provider  acetaminophen  (TYLENOL ) 500 MG tablet Take 1,000 mg by mouth every 6 (six) hours as needed for headache.    [provider]  albuterol  (VENTOLIN  HFA) 108 (90 Base) MCG/ACT inhaler Inhale 2 puffs into the lungs every 6 (six) hours as needed for shortness of breath or wheezing. 09/23/23 09/22/24  [provider]  Ascorbic Acid  (VITAMIN C) 125 MG CHEW Chew 125 mg by mouth daily at 12 noon.    [provider]  Biotin 5000 MCG CAPS Take 5,000 mcg by mouth daily at 12 noon.    [provider]  budesonide  (ENTOCORT EC ) 3 MG 24 hr capsule Take 9 mg by mouth daily.    [provider]  CHOLECALCIFEROL SL Place 1 tablet under the tongue 2 (two) times daily with a meal.    [provider]  diphenhydrAMINE (BENADRYL) 50 MG capsule Take one capsule 1 hour prior to scan. 06/08/24   Cooper, Michael, MD  Ferrous Sulfate (BL IRON PO) Take 25 mg by mouth daily at 12 noon.    [provider]  predniSONE  (DELTASONE ) 50 MG tablet Take one tablet 13 hours, 7 hours, and 1 hour prior to scan. 06/08/24   Wonda Sharper, MD  vitamin E 180 MG (400 UNITS) capsule Take 400 Units by mouth daily. Gamma 100 mg - Alpha    [provider]  vitamin k 100 MCG tablet Take 100 mcg by mouth daily.    [provider]    Allergies: Codeine, Erythromycin, Iodine, Ioversol, Neomycin, Shellfish allergy, Azithromycin, Corn-containing products, Wheat, Doxycycline  hyclate, Dilaudid [hydromorphone hcl], Nsaids, Prednisone , and Tetracyclines &  related    Review of Systems  Updated Vital Signs BP (!) 141/88 (BP Location: Left Arm)   Pulse 95   Temp 98.1 F (36.7 C)   Resp 18   SpO2 100%   Physical Exam Vitals and nursing note reviewed.  Constitutional:      General: He is not in acute distress.    Appearance: He is well-developed.  HENT:     Head: Normocephalic and atraumatic.     Right Ear: Tympanic membrane, ear canal and external  ear normal.     Left Ear: Tympanic membrane, ear canal and external ear normal.     Nose: Nose normal.     Mouth/Throat:     Pharynx: Uvula midline.  Eyes:     General: Lids are normal.        Right eye: No discharge.        Left eye: No discharge.     Conjunctiva/sclera: Conjunctivae normal.     Pupils: Pupils are equal, round, and reactive to light.  Cardiovascular:     Rate and Rhythm: Normal rate and regular rhythm.     Heart sounds: Normal heart sounds.  Pulmonary:     Effort: Pulmonary effort is normal.     Breath sounds: Normal breath sounds.  Abdominal:     Palpations: Abdomen is soft.     Tenderness: There is no abdominal tenderness.  Musculoskeletal:        General: Normal range of motion.     Cervical back: Normal range of motion and neck supple. No tenderness or bony tenderness.  Skin:    General: Skin is warm and dry.  Neurological:     Mental Status: He is alert and oriented to person, place, and time.     GCS: GCS eye subscore is 4. GCS verbal subscore is 5. GCS motor subscore is 6.     Cranial Nerves: No cranial nerve deficit.     Sensory: No sensory deficit.     Motor: No abnormal muscle tone.     Comments: Slow to respond.  He answers basic questions.  He states that he does not feel well.  He can read my fingers held in front of his face.  No obvious focal neuro deficits, but minimally compliant with exam.  He lifts his arms, squeezes my fingers, wiggles his toes on command without any apparent weakness.     (all labs ordered are listed, but only abnormal results are displayed) Labs Reviewed  COMPREHENSIVE METABOLIC PANEL WITH GFR - Abnormal; Notable for the following components:      Result Value   Glucose, Bld 102 (*)    BUN 25 (*)    Total Bilirubin 1.3 (*)    All other components within normal limits  URINALYSIS, ROUTINE W REFLEX MICROSCOPIC - Abnormal; Notable for the following components:   Color, Urine STRAW (*)    Specific Gravity, Urine 1.004  (*)    All other components within normal limits  CBC  AMMONIA  ETHANOL  RAPID URINE DRUG SCREEN, HOSP PERFORMED  CBG MONITORING, ED    EKG: EKG Interpretation Date/Time:  Tuesday June 29 2024 11:36:44 EDT Ventricular Rate:  92 PR Interval:  122 QRS Duration:  90 QT Interval:  360 QTC Calculation: 445 R Axis:   93  Text Interpretation: Normal sinus rhythm Rightward axis Nonspecific ST abnormality Abnormal ECG When compared with ECG of 08-Jun-2024 14:53, PREVIOUS ECG IS PRESENT No significant change since last tracing Confirmed by Dasie,  Curtistine ([id number]) on 06/29/2024 12:03:51 PM  Radiology: CT Head Wo Contrast Result Date: 06/29/2024 EXAM: CT HEAD WITHOUT CONTRAST 06/29/2024 01:10:50 PM TECHNIQUE: CT of the head was performed without the administration of intravenous contrast. Automated exposure control, iterative reconstruction, and/or weight based adjustment of the mA/kV was utilized to reduce the radiation dose to as low as reasonably achievable. COMPARISON: 04/26/2024 CLINICAL HISTORY: altered LOC. CT Head Wo Contrast; altered LOC ; Wife states when pt woke up around 9am not feeling well with feeling of neuropathy pain and felt like heart was racing. Wife went to start laundry and returned 2 minutes later and found pt on ground. No sign of injury. Wife did not hear and fall and ; does not think he hurt himself. Pt is not on any blood thinners. Pt was confused and not speaking much when wife got to him. He complained of nausea and loss of vision. Vision has been going in and out. FINDINGS: BRAIN AND VENTRICLES: No acute hemorrhage. No evidence of acute infarct. No hydrocephalus. No extra-axial collection. No mass effect or midline shift. ORBITS: No acute abnormality. SINUSES: Mild mucosal thickening in ethmoid air cells. SOFT TISSUES AND SKULL: No acute soft tissue abnormality. No skull fracture. IMPRESSION: 1. No acute intracranial abnormality. 2. Mild mucosal thickening in the ethmoid  air cells. Electronically signed by: Donnice Mania MD 06/29/2024 01:36 PM EDT RP Workstation: [id number]     Procedures   Medications Ordered in the ED - No data to display  ED Course  Patient seen and examined. History obtained directly from patient and family member. Extensive chart review from past 6 months performed.   Labs/EKG: Ordered CBC, CMP, ethanol, UDS, ammonia, UA.  Imaging: Ordered CT head  Medications/Fluids: Ordered: Fluid bolus  Most recent vital signs reviewed and are as follows: BP (!) 141/88 (BP Location: Left Arm)   Pulse 95   Temp 98.1 F (36.7 C)   Resp 18   SpO2 100%   Initial impression: Patient with altered mental status from baseline, complex recent past medical history with deconditioning, some of the symptoms he has had before.  Presents today over the concern for likely syncope, confusion state change from baseline.  No focal neurologic findings on my exam to suggest acute stroke.  2:57 PM Reassessment performed. Patient appears stable, still not back at baseline, still minimally talkative.  Labs personally reviewed and interpreted including: CBC unremarkable; CMP notable; ethanol negative; ammonia negative; UDS negative; CBG normal.  Imaging personally visualized and interpreted including: CT head agree negative.  Reviewed pertinent lab work and imaging with patient and family at bedside. Questions answered.   Most current vital signs reviewed and are as follows: BP 104/68   Pulse 73   Temp 98.1 F (36.7 C)   Resp 14   SpO2 96%   Plan: Admit to hospital  I spoke with on-call neurology, Dr. Voncile, in regards to consult and EEG.  Will proceed, EEG ordered.  I discussed case with family practice service who will see patient.                                  Medical Decision Making Amount and/or Complexity of Data Reviewed Labs: ordered. Radiology: ordered.  Risk Decision regarding hospitalization.   Patient with syncopal episode  with previous neuro and cardiac history, not returned to baseline.  Workup thus far reassuring.  Will admit for further evaluation.  Neuro consult.  Final diagnoses:  Encephalopathy, unspecified type  Syncope, unspecified syncope type    ED Discharge Orders     None          Desiderio Chew, PA-C 06/29/24 1500    Dasie Faden, MD 06/30/24 1552

## 2024-06-29 NOTE — ED Triage Notes (Signed)
 Pts wife reports he had an unwitnessed syncopal episode this morning. C/o not feeling well this morning. Vision loss this morning and vision coming and going. Hx of shunt, stroke

## 2024-06-29 NOTE — ED Notes (Signed)
 CCMD called to report patient's transfer to 3W08.

## 2024-06-29 NOTE — H&P (Addendum)
 Hospital Admission History and Physical Service Pager: [phone]  Patient name: Manuel [name] Medical record number: [id number] Date of Birth: [birth year] Age: 43 y.o. Gender: male  Primary Care Provider: Beam, Lamar POUR, MD Consultants: Neurology, Cardiology Code Status: Full code which was confirmed with family if patient unable to confirm   Preferred Emergency Contact:  Contact Information     Name Relation Home Work Mobile   [place] Spouse   [phone]      Other Contacts   None on File     Chief Complaint: Altered mental status  Assessment and Plan: Manuel [name] is a 43 y.o. male presenting with altered mental status after a syncopal episode. Differential for presentation of this includes: Metabolic encephalopathy - likely as patient does have a history of  electrolyte and and vitamin deficiencies 2/2 collagenous gastritis. Reassuringly he is afebrile, stable on RA and with no evidence of infection. Labs are stable, ammonia and blood alcohol levels within normal limits. Will obtain further labs as below Seizure - Less likely as no previous history of seizure but proceeding with EEG as below TIA - Patient with L-sided facial and UE weakness at baseline but this is 2/2 small fiber neuropathy so less likely.  CT head also without acute intracranial abnormality.  Assessment & Plan Altered mental status Remains altered. Seems like this is a chronic issue for this patient.  - Admit to FMTS, Med tele, attending Dr. Orie - Neurology consulted by ED, appreciate recommendations - Overnight EEG - Cardiology consulted, appreciate recommendations - Ordered Vitamin B12, Vitamin B1, folate, RPR, TSH - AM BMP, CBC - Mg pending - Fall and seizure precautions - Continuous cardiac monitoring  FEN/GI: NPO VTE Prophylaxis: Lovenox   Disposition: Med Tele  History of Present Illness:  Manuel [name] is a 43 y.o. male presenting after syncopal episode.  Patient awake, states he is confused. History is provided by the patient's wife at bedside. Patient began to feel not like himself last night. Woke up this morning feeling okay. Wife was doing laundry when she found [name] on the ground around 11am. No reported injuries from the fall. Patient reported loss of vision, sensation of heart racing, nausea and increased generalized pain from small fiber neuropathy. Per the patient's wife, he has been seen in the ED for similar issues in the past. He normally returns to his baseline after around 30 minutes but this is not the case today. Has left-sided facial and UE weakness at baseline. Wife also reports several electrolyte and vitamin deficiencies 2/2 h/o collagenous gastritis.  In the ED, workup significant for slightly elevated T bili (1.3) but otherwise normal CBC, CMP, UA, UDS. Ammonia and blood alcohol normal. CT head without acute intracranial abnormality. EKG showing NSR.  Review Of Systems: Per HPI with the following additions: none  Pertinent Past Medical History: Small fiber neuropathy, diagnosed via biopsy Collagenous gastritis PVCs PFO (small, not thought to require closure) Complex migraine POTS Sinus pauses/syncope SVT (only with cardiac cath) H/o Viral meningitis H/o testicular cancer s/p radiation H/o multiple concussions H/o H. Pylori gastritis  Remainder reviewed in history tab.   Pertinent Past Surgical History: Scrotal/testicular surgery - 09/02/2004 Rotator cuff repair Right heart cath - 05/05/24 Orchiectomy Biopsy - 02/11/23  Remainder reviewed in history tab.   Pertinent Social History: Tobacco use: None Alcohol use: None Other Substance use: None Lives with wife  Pertinent Family History: Mother - migraines Father - T2DM  Remainder reviewed  in history tab.   Important Outpatient Medications: Tylenol  500 mg q6hrs PRN Albuterol  2 puffs q6hrs PRN  Vitamin C 125 mg daily Biotin 5000 mcg daily Vitamin D  BID Ferrous sulfate 25 mg daily Vitamin E 180 mg daily Vitamin K 100 mcg daily  Remainder reviewed in medication history.   Objective: BP 104/68   Pulse 73   Temp 98.1 F (36.7 C)   Resp 14   SpO2 96%   Exam: General: Drowsy but awake. Able to follow commands. A&Ox3. NAD  HEENT: No sign of trauma Neck: Supple, normal ROM Cardiovascular: RRR, no m/r/g appreciated.  Pulmonary: Normal WOB. CTAB with no w/c/r present. Abdomen: Soft, non-tender, non-distended. Extremities: Warm and well-perfused, without cyanosis or edema. Neurologic: AOx3. Open eyes, able to follow commands. PERRL. EOMI. Normal sensation in V1, V2, V3. Symmetric smile and brow raise. Normal hearing. Symmetric palate raise. 5/5 shoulder shrug. Symmetric tongue protrusion, 5/5 strength in UE/LE. Normal sensation in UE and LE bilaterally  Skin: No rashes or lesions.    Labs:  CBC BMET  Recent Labs  Lab 06/29/24 1204  WBC 7.5  HGB 15.0  HCT 45.9  PLT 305   Recent Labs  Lab 06/29/24 1204  NA 136  K 4.4  CL 101  CO2 25  BUN 25*  CREATININE 0.96  GLUCOSE 102*  CALCIUM 9.1     Pertinent additional labs: Total bili elevated at 1.3. Ammonia 18, EtOH < 15 - both wnl  UA normal UDS negative    EKG: NSR, slightly peaked T waves very similar to previous EKG. QTC normal.   Imaging Studies Performed: CT Head Wo Contrast Result Date: 06/29/2024 1. No acute intracranial abnormality.  2. Mild mucosal thickening in the ethmoid air cells.    Jerrie Gathers, DO 06/29/2024, 3:00 PM PGY-1, [facility]  FPTS Intern pager: [phone], text pages welcome Secure chat group [facility] Teaching Service

## 2024-06-29 NOTE — Assessment & Plan Note (Addendum)
 Remains altered. Seems like this is a chronic issue for this patient.  - Admit to FMTS, Med tele, attending Dr. Orie - Neurology consulted by ED, appreciate recommendations - Overnight EEG - Cardiology consulted, appreciate recommendations - Ordered Vitamin B12, Vitamin B1, folate, RPR, TSH - AM BMP, CBC - Mg pending - Fall and seizure precautions - Continuous cardiac monitoring

## 2024-06-29 NOTE — Progress Notes (Signed)
 LTM EEG hooked up and running - no initial skin breakdown. CT leads used. unmonitored at this time due to patient being in the ED.

## 2024-06-29 NOTE — Consult Note (Addendum)
 Neurology Consultation Reason for Consult: Syncope Referring Physician: Fonda Langton, PA  CC: Syncope  History is obtained from: Patient, wife at bedside, father at bedside, chart review  HPI: Manuel [name] is a 43 y.o. male with history of collagenous gastritis, small fiber neuropathy, PVCs and PFO who presented after an episode of alteration of awareness.  Per wife he woke up this morning stating he is not feeling well which typically means his neuropathy is worse and his heart may not be feeling well.  Then around 1030 she was doing laundry and saw him on the floor but did not hear him falling.  When she went to pick him up he reported feeling sick and therefore she took him to the bathroom, denies any vomiting.  She therefore made him lay down but he was confused, not answering questions appropriately and therefore they brought him to the ER.  Of note patient has had similar episodes in the past and has been diagnosed with migraine with aura.  Additionally per wife due to his collagenous gastritis he has significant electrolyte as well as vitamin abnormalities which have led to neuropathy as well as cardiac issues leading to frequent episodes of syncope.  However typically it takes him about an hour to return to baseline and today it has been more than 5 hours and he is still not back to baseline  Denies any history of seizures, jerking, incontinence, tongue bite.   ROS: Unable to obtain due to altered mental status.   Past Medical History:  Diagnosis Date   Bilateral flank pain 07/17/2012   Complication of lumbar puncture 10/05/2015   Concussion    pt reports he has hx of multiple concussions, last one was 2009   Headache 10/05/2015   Hypotension    Left sided numbness 04/24/2012   Migraine    Nausea without vomiting 10/05/2015   POTS (postural orthostatic tachycardia syndrome)    Seizure (HCC)    Subacute confusional state 10/05/2015   Syncope 04/24/2012   Testicular cancer (HCC)  2006   Traumatic CSF leak 10/05/2015   Viral meningitis 04/08/2014   Vision changes 10/05/2015    Family History  Problem Relation Age of Onset   Migraines Mother    Diabetes type II Father     Social History:  reports that he has never smoked. He has never used smokeless tobacco. He reports that he does not currently use alcohol. He reports that he does not use drugs.  Exam: Current vital signs: BP 104/68   Pulse 73   Temp 98.1 F (36.7 C)   Resp 14   SpO2 96%  Vital signs in last 24 hours: Temp:  [98.1 F (36.7 C)] 98.1 F (36.7 C) (10/28 1130) Pulse Rate:  [73-95] 73 (10/28 1300) Resp:  [11-18] 14 (10/28 1300) BP: (104-141)/(68-88) 104/68 (10/28 1300) SpO2:  [96 %-100 %] 96 % (10/28 1300)   Physical Exam  Constitutional: Appears well-developed and well-nourished.  Psych: Affect appropriate to situation Neuro: Drowsy but opens his eyes, oriented to person but not to time and place, cranial nerves appear grossly intact, antigravity strength in all 4 extremities, could not participate in finger-nose testing  I have reviewed labs in epic and the results pertinent to this consultation are: CBC:  Recent Labs  Lab 06/29/24 1204  WBC 7.5  HGB 15.0  HCT 45.9  MCV 88.1  PLT 305    Basic Metabolic Panel:  Lab Results  Component Value Date   NA 136  06/29/2024   K 4.4 06/29/2024   CO2 25 06/29/2024   GLUCOSE 102 (H) 06/29/2024   BUN 25 (H) 06/29/2024   CREATININE 0.96 06/29/2024   CALCIUM 9.1 06/29/2024   GFRNONAA >60 06/29/2024   GFRAA >60 04/12/2017   Lipid Panel:  Lab Results  Component Value Date   LDLCALC 121 (H) 04/24/2012   HgbA1c:  Lab Results  Component Value Date   HGBA1C 5.6 10/16/2015   Urine Drug Screen:     Component Value Date/Time   LABOPIA NONE DETECTED 06/29/2024 1318   COCAINSCRNUR NONE DETECTED 06/29/2024 1318   LABBENZ NONE DETECTED 06/29/2024 1318   AMPHETMU NONE DETECTED 06/29/2024 1318   THCU NONE DETECTED 06/29/2024 1318    LABBARB NONE DETECTED 06/29/2024 1318    Alcohol Level     Component Value Date/Time   ETH <15 06/29/2024 1300     I have reviewed the images obtained:  CT head without contrast 06/21/2024: No acute abnormality  MRI brain without contrast 04/27/2024: No acute abnormality MRA head and neck without contrast 04/27/2024: Normal  MRI brain without contrast 03/06/2024: Normal MRA head and neck without contrast 03/07/2024: Normal  ASSESSMENT/PLAN: 43 year old male with multiple medical comorbidities as noted in HPI presented after an episode of alteration of awareness   Alteration of awareness Other differentials include syncope secondary to cardiac issues versus less likely seizure  Recommendations: - Patient has had multiple MRI brain's and MRIs in the past.  Therefore we will hold off on repeating as there is no focal deficit on exam today - Will get 24-hour routine EEG to look for any ictal-interictal abnormality however suspicion is low therefore we will not yet start any antiseizure medications - His CMP is relatively unremarkable.  I will also check magnesium levels -Will also check orthostatic vital signs -Please keep on telemetry while in the hospital - If above workup is negative, would recommend continuing follow-up with cardiology and GI for his gastritis, PFO and PVCs - Discussed plan with PA Geiple via secure chat  Thank you for allowing us  to participate in the care of this patient. If you have any further questions, please contact  me or neurohospitalist.   Arlin Krebs [facility] neurohospitalist

## 2024-06-29 NOTE — ED Triage Notes (Signed)
 Wife states when pt woke up around 9am not feeling well with feeling of neuropathy pain and felt like heart was racing. Wife went to start laundry and returned 2 minutes later and found pt on ground. No sign of injury. Wife did not hear and fall and does not think he hurt himself. Pt is not on any blood thinners. Pt was confused and not speaking much when wife got to him. He complained of nausea and loss of vision. Vision has been going in and out.

## 2024-06-29 NOTE — Plan of Care (Signed)
 FMTS Interim Progress Note  S: Patient seen on overnight rounds. States he is feeling much better than he did previously. He states he is still not completely back to baseline. He states the event was very sudden and frightening. He states he did not lose consciousness.   O: BP 111/70 (BP Location: Left Arm)   Pulse 69   Temp 98.6 F (37 C) (Oral)   Resp 16   SpO2 100%   General: NAD, EEG in place  Neuro: A&O x 4 Cardiovascular: RRR, no M/R/G Respiratory: CTAB, normal work of breathing on room air   A/P: AMS Improving. Mag 2.3. - Neurology consulted, EEG in place  - Vitamin B12, Vitamin B1, folate, RPR, TSH ordered - Rest of plan per day team  Lennie Raguel MATSU, DO 06/29/2024, 9:39 PM PGY-1, [facility] Family Medicine Service pager [phone]

## 2024-06-30 ENCOUNTER — Other Ambulatory Visit: Payer: Self-pay

## 2024-06-30 ENCOUNTER — Inpatient Hospital Stay (HOSPITAL_COMMUNITY)

## 2024-06-30 ENCOUNTER — Encounter (HOSPITAL_COMMUNITY): Payer: Self-pay

## 2024-06-30 DIAGNOSIS — R569 Unspecified convulsions: Secondary | ICD-10-CM | POA: Diagnosis not present

## 2024-06-30 DIAGNOSIS — R404 Transient alteration of awareness: Secondary | ICD-10-CM | POA: Diagnosis not present

## 2024-06-30 DIAGNOSIS — G909 Disorder of the autonomic nervous system, unspecified: Secondary | ICD-10-CM

## 2024-06-30 DIAGNOSIS — R410 Disorientation, unspecified: Secondary | ICD-10-CM | POA: Diagnosis not present

## 2024-06-30 DIAGNOSIS — K2970 Gastritis, unspecified, without bleeding: Secondary | ICD-10-CM

## 2024-06-30 DIAGNOSIS — G629 Polyneuropathy, unspecified: Secondary | ICD-10-CM

## 2024-06-30 LAB — CBC
HCT: 42 % (ref 39.0–52.0)
Hemoglobin: 14 g/dL (ref 13.0–17.0)
MCH: 29.4 pg (ref 26.0–34.0)
MCHC: 33.3 g/dL (ref 30.0–36.0)
MCV: 88.1 fL (ref 80.0–100.0)
Platelets: 261 K/uL (ref 150–400)
RBC: 4.77 MIL/uL (ref 4.22–5.81)
RDW: 12.7 % (ref 11.5–15.5)
WBC: 8.1 K/uL (ref 4.0–10.5)
nRBC: 0 % (ref 0.0–0.2)

## 2024-06-30 LAB — BASIC METABOLIC PANEL WITH GFR
Anion gap: 9 (ref 5–15)
BUN: 26 mg/dL — ABNORMAL HIGH (ref 6–20)
CO2: 26 mmol/L (ref 22–32)
Calcium: 9.5 mg/dL (ref 8.9–10.3)
Chloride: 101 mmol/L (ref 98–111)
Creatinine, Ser: 0.94 mg/dL (ref 0.61–1.24)
GFR, Estimated: >60 mL/min (ref >60)
Glucose, Bld: 106 mg/dL — ABNORMAL HIGH (ref 70–99)
Potassium: 4.4 mmol/L (ref 3.5–5.1)
Sodium: 136 mmol/L (ref 135–145)

## 2024-06-30 LAB — VITAMIN B12: Vitamin B-12: 483 pg/mL (ref 180–914)

## 2024-06-30 LAB — TSH: TSH: 1.66 u[IU]/mL (ref 0.350–4.500)

## 2024-06-30 LAB — FOLATE: Folate: 10.1 ng/mL (ref >5.9)

## 2024-06-30 LAB — RPR: RPR Ser Ql: NONREACTIVE

## 2024-06-30 MED ORDER — DIPHENHYDRAMINE HCL 50 MG/ML IJ SOLN: 50.0000 mg | Freq: Once | INTRAMUSCULAR | Status: DC

## 2024-06-30 MED ORDER — DIPHENHYDRAMINE HCL 25 MG PO CAPS: 50.0000 mg | ORAL_CAPSULE | Freq: Once | ORAL | Status: DC

## 2024-06-30 MED ORDER — DIPHENHYDRAMINE HCL 25 MG PO CAPS
50.0000 mg | ORAL_CAPSULE | Freq: Once | ORAL | Status: AC
  Administered 2024-06-30: 50 mg via ORAL
  Filled 2024-06-30: qty 2

## 2024-06-30 MED ORDER — PREDNISONE 5 MG PO TABS
50.0000 mg | ORAL_TABLET | Freq: Four times a day (QID) | ORAL | Status: AC
  Administered 2024-06-30 (×3): 50 mg via ORAL
  Filled 2024-06-30 (×3): qty 2

## 2024-06-30 MED ORDER — DIPHENHYDRAMINE HCL 50 MG/ML IJ SOLN: 50.0000 mg | Freq: Once | INTRAMUSCULAR | Status: AC

## 2024-06-30 NOTE — Progress Notes (Signed)
 Cardiology Note:   Pt reports improvement in symptoms I reviewed telemetry: sinus with blocked PACs EEG negative Chest CTA today to exclude pulmonary AVMs (ordered to be done as an outpatient).   Will follow up tomorrow. Nothing to add today. No cardiac reason for him to remain in the hospital if the primary team would like to discharge him today.   Lonni Cash, MD, [facility] 06/30/2024 12:31 PM

## 2024-06-30 NOTE — Progress Notes (Signed)
 Patient has own ted hose from home, he applies and takes off  during the day.  Naama Sappington, Cena Helling, RN

## 2024-06-30 NOTE — Plan of Care (Signed)
   Problem: Education: Goal: Knowledge of General Education information will improve Description Including pain rating scale, medication(s)/side effects and non-pharmacologic comfort measures Outcome: Progressing

## 2024-06-30 NOTE — Plan of Care (Signed)

## 2024-06-30 NOTE — Progress Notes (Signed)
 Subjective: NAEO.  States he did on chat GPT that he may get micro emboli from his cardiopulmonary strength and wanted to know if Dopplers would show that emboli.  ROS: negative except above  Examination  Vital signs in last 24 hours: Temp:  [98 F (36.7 C)-99 F (37.2 C)] 98.4 F (36.9 C) (10/29 0828) Pulse Rate:  [63-95] 67 (10/29 0828) Resp:  [10-18] 18 (10/29 0828) BP: (103-141)/(65-88) 104/69 (10/29 0828) SpO2:  [96 %-100 %] 100 % (10/29 0828)  General: lying in bed, NAD  Neuro: MS: Alert, oriented, follows commands CN: pupils equal and reactive,  EOMI, face symmetric, tongue midline, decreased sensation on left side of face Motor: 5/5 strength in all 4 extremities Coordination: normal Gait: not tested  Basic Metabolic Panel: Recent Labs  Lab 06/29/24 1204 06/29/24 1300 06/30/24 0300  NA 136  --  136  K 4.4  --  4.4  CL 101  --  101  CO2 25  --  26  GLUCOSE 102*  --  106*  BUN 25*  --  26*  CREATININE 0.96  --  0.94  CALCIUM 9.1  --  9.5  MG  --  2.3  --     CBC: Recent Labs  Lab 06/29/24 1204 06/30/24 0300  WBC 7.5 8.1  HGB 15.0 14.0  HCT 45.9 42.0  MCV 88.1 88.1  PLT 305 261     Coagulation Studies: No results for input(s): LABPROT, INR in the last 72 hours.  Imaging No new brain imaging overnight    ASSESSMENT AND PLAN:43 year old male with multiple medical comorbidities as noted in HPI presented after an episode of alteration of awareness     Alteration of awareness -Semiology of the episode most likely syncope due to autonomic dysfunction in a patient with gastritis causing small fiber neuropathy   Recommendations: -Discussed conservative measures including fluid intake, increase salt intake, abdominal binders, compression stockings.  If this is not enough, may need to consider midodrine or Florinef  - Patient has had multiple MRI brain's and MRIs in the past.  Therefore we will hold off on repeating as there is no focal deficit on  exam today - EEG is within normal limits.  Semiology of the episode not consistent with seizure.  Therefore will not start any antiseizure medications and DC LTM - Patient expressed concern about micro emboli from his cardio-pulmonary shunt and requested a transcranial Doppler.  I will refer him to Dr. Rosemarie.  I did discuss that this would likely not be helpful.  Additionally if there is concern for microembolic, we could empirically put him on aspirin  81 mg.  However given his GI conditions, I would commend he clear this with his gastroenterologist first - Discussed plan with medicine team via secure chat   Thank you for allowing us  to participate in the care of this patient. If you have any further questions, please contact  me or neurohospitalist.     I personally spent a total of 40 minutes in the care of the patient today including getting/reviewing separately obtained history, performing a medically appropriate exam/evaluation, counseling and educating, placing orders, referring and communicating with other health care professionals, documenting clinical information in the EHR, independently interpreting results, and coordinating care.           Arlin Krebs Epilepsy [facility] For questions after 5pm please refer to AMION to reach the Neurologist on call

## 2024-06-30 NOTE — Hospital Course (Signed)
 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 His hospital course is detailed below:  Altered Mental Status  Patient presented after an episode of near syncope and confusion. He has had episodes similar to these in the past with extensive workup with cardiology and neurology. This episode lasted longer than usual. Lasting multiple hours compared to less than an hour. On presentation, patient was confused, not fully oriented, without any new focal deficits (has history of left facial numbness). CT Head without contrast did not show any acute intracranial abnormality. Neurology was consulted and started 24 hr EEG. Patient was maintained on cardiac telemetry which was normal. Electrolytes and vitamin testing were within normal limits. TSH was normal. His cardiology team was consulted and did not believe that this presentation was related to his ongoing cardiac concerns regarding PFO, PAC or PVC. There was some discussion of Orthodeoxia platypnea syndrome, so CTA chest was done to evaluate for pulmonary AVMs. Recommended further outpatient follow up for exercise stress testing. EEG showed to have no epileptic activity. Neurology recommended increasing fluids, salt intake, compression stockings, and midodrine. Due to patient's gastritis he is on low salt diet, but is considering the other interventions. ***  Other chronic conditions were medically managed with home medications and formulary alternatives as necessary (None)  PCP Follow-up Recommendations: Follow up CTA results  Follow up exercise stress test with cardiology  Follow up appointment with neurology specialist Dr. Rosemarie, for additional testing

## 2024-06-30 NOTE — TOC Initial Note (Signed)
 Transition of Care [facility]) - Initial/Assessment Note    Patient Details  Name: Manuel [name] MRN: [id number] Date of Birth: 1981/01/21  Transition of Care [facility]) CM/SW Contact:    Manuel JULIANNA George, RN Phone Number: 06/30/2024, 12:33 PM  Clinical Narrative:                  Manuel [name] is a 43 y.o. male presenting after syncopal episode. Patient awake, states he is confused.   Pt is from home with his spouse. They are together some of the time. Pt was driving but spouse can assist with transportation. He manages his own medications and denies any issues.  No DME at home.   IP Care management following.  Expected Discharge Plan: Home/Self Care Barriers to Discharge: Continued Medical Work up   Patient Goals and CMS Choice            Expected Discharge Plan and Services       Living arrangements for the past 2 months: Single Family Home                                      Prior Living Arrangements/Services Living arrangements for the past 2 months: Single Family Home Lives with:: Spouse Patient language and need for interpreter reviewed:: Yes Do you feel safe going back to the place where you live?: Yes        Care giver support system in place?: Yes (comment)   Criminal Activity/Legal Involvement Pertinent to Current Situation/Hospitalization: No - Comment as needed  Activities of Daily Living   ADL Screening (condition at time of admission) Independently performs ADLs?: Yes (appropriate for developmental age) Is the patient deaf or have difficulty hearing?: No Does the patient have difficulty seeing, even when wearing glasses/contacts?: No Does the patient have difficulty concentrating, remembering, or making decisions?: No  Permission Sought/Granted                  Emotional Assessment Appearance:: Appears stated age Attitude/Demeanor/Rapport: Engaged Affect (typically observed): Accepting Orientation: : Oriented to Self,  Oriented to Place, Oriented to  Time, Oriented to Situation   Psych Involvement: No (comment)  Admission diagnosis:  Encephalopathy [G93.40] Syncope, unspecified syncope type [R55] Encephalopathy, unspecified type [G93.40] Patient Active Problem List   Diagnosis Date Noted   Altered mental status 06/29/2024   Encephalopathy 06/29/2024   SVT (supraventricular tachycardia) 06/02/2024   Hypertension 06/02/2024   Stroke-like symptoms 04/27/2024   PFO (patent foramen ovale) 04/27/2024   History of testicular cancer 04/27/2024   Small fiber neuropathy 04/27/2024   Collagenous gastritis 04/27/2024   Chronic migraine w/o aura w/o status migrainosus, not intractable 11/26/2023   Paresthesia 06/26/2023   Unexplained weight loss 10/08/2022   POTS (postural orthostatic tachycardia syndrome) 09/19/2020   Migraine 10/05/2015   Subacute confusional state 10/05/2015   Headache 10/05/2015   Visual changes 10/05/2015   Nausea with vomiting 10/05/2015   Positional headache 10/05/2015   Complication of lumbar puncture 10/05/2015   Traumatic CSF leak 10/05/2015   Viral meningitis 04/08/2014   Testicular cancer (HCC) 07/17/2012   Bilateral flank pain 07/17/2012   Left sided numbness 04/24/2012   Syncope and collapse 04/24/2012   PCP:  Beam, Manuel POUR, MD Pharmacy:   [facility] [id number] - [place], [place] - [address] [address] Phone: [phone] Fax: [phone]  Social Drivers of Health (SDOH) Social History: SDOH Screenings   Food Insecurity: No Food Insecurity (06/30/2024)  Housing: Low Risk  (06/30/2024)  Transportation Needs: No Transportation Needs (06/30/2024)  Utilities: Not At Risk (06/30/2024)  Financial Resource Strain: Patient Declined (09/16/2023)   Received from [facility]  Physical Activity: Sufficiently Active (09/16/2023)   Received from [facility]  Social Connections: Moderately Integrated (09/16/2023)    Received from [facility]  Stress: Stress Concern Present (09/16/2023)   Received from [facility]  Tobacco Use: Low Risk  (06/22/2024)   Received from [facility]   SDOH Interventions:     Readmission Risk Interventions     No data to display

## 2024-06-30 NOTE — Progress Notes (Signed)
 LTM VIDEO EEG discontinued - no skin breakdown at [facility].

## 2024-06-30 NOTE — Progress Notes (Signed)
 vLTM maintenance  No skin breakdown noted at  Fz A1 A2  T3 T4  All impedances below 10k

## 2024-06-30 NOTE — Procedures (Addendum)
 Patient Name: Manuel [name]  MRN: [id number]  Epilepsy Attending: Arlin MALVA Krebs  Referring Physician/Provider: Krebs Arlin MALVA, MD  Duration: 06/29/2024 1510 to 06/30/2024 0915  Patient history: 43 year old male with multiple medical comorbidities as noted in HPI presented after an episode of alteration of awareness. EEG to evaluate for seizure  Level of alertness: Awake, asleep  AEDs during EEG study: None  Technical aspects: This EEG study was done with scalp electrodes positioned according to the 10-20 International system of electrode placement. Electrical activity was reviewed with band pass filter of 1-70Hz , sensitivity of 7 uV/mm, display speed of 34mm/sec with a 60Hz  notched filter applied as appropriate. EEG data were recorded continuously and digitally stored.  Video monitoring was available and reviewed as appropriate.  Description: The posterior dominant rhythm consists of 9 Hz activity of moderate voltage (25-35 uV) seen predominantly in posterior head regions, symmetric and reactive to eye opening and eye closing. Sleep was characterized by vertex waves, sleep spindles (12 to 14 Hz), maximal frontocentral region. Hyperventilation and photic stimulation were not performed.     IMPRESSION: This study is within normal limits. No seizures or epileptiform discharges were seen throughout the recording.  A normal interictal EEG does not exclude the diagnosis of epilepsy.   Hinda Lindor O Pablo Mathurin

## 2024-06-30 NOTE — Discharge Instructions (Addendum)
 Dear Manuel [name],   Thank you for letting us  participate in your care! In this section, you will find a brief hospital admission summary of why you were admitted to the hospital, what happened during your admission, your diagnosis/diagnoses, and recommended follow up.  Primary diagnosis: autonomic dysfunction secondary to small fiber neuropathy Treatment plan: Your CT head did not show any acute abnormalities. EEG did not show any seizure activity. CTA chest revealed no evidence of pulmonary ateriovenous malformations. This was a test that was planned to be done by outpatient cardiologist team. Neurology has referred you to a specialty neurologist who can further investigate these recurrent episodes with other testing. We have also done some testing for blood clotting which have been normal so far. We will follow up with you with any remaining results if they are abnormal. Neurology recommendations were to increase fluids, salt intake, midodrine (medication) if needed, and aspirin  81 mg. Some of these are not possible due to your gastritis. We recommend at least doing the increase in fluids and compression socks. Please check with your gastroenterologist if you can take aspirin  81 mg. This would help prevent stroke if that is the cause of these episodes.    POST-HOSPITAL & CARE INSTRUCTIONS We recommend following up with your PCP within 1 week from being discharged from the hospital. Please let PCP/Specialists know of any changes in medications that were made which you will be able to see in the medications section of this packet. Please also follow up with cardiology regarding your CTA chest and established care. Please follow up with neurology, Dr Rosemarie, regarding additional neurological evaluation (cranial doppler) We will follow up with you with any other abnormal lab results from the remaining pending labs.    Thank you for choosing [facility]! Take care and be well!  Family  Medicine Teaching Service Inpatient Team [place]  [facility]  [address] [place], [place] [address] [phone]

## 2024-06-30 NOTE — Progress Notes (Signed)
     Daily Progress Note Intern Pager: [phone]  Patient name: Manuel [name] Medical record number: [id number] Date of birth: 09-10-1980 Age: 43 y.o. Gender: male  Primary Care Provider: Beam, Lamar POUR, MD Consultants: Neurology, Cardiology (signed off) Code Status: full  Pt Overview and Major Events to Date:  10/28 - admitted  Assessment and Plan:  This is a 43 year old male with a PMH of collagenous gastritis, testicular cancer S/P radiation, PFO, POTS (per most recent cardiology note is inconsistent with POTS), small fiber neuropathy, SVT, HTN who was admitted for a near syncopal event and subsequent confusion that is now significantly improved. Assessment & Plan Altered mental status Cardiology was consulted and recommended ongoing outpatient evaluation.  EEG without seizure activity.  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  -Neurology following, appreciate recs  -Deferring MRI given multiple in the past  -24-hour EEG, results above -Follow vitamin B1 -Telemetry -Awaiting orthostatics -Will get CTA chest today per cardiology, he has a contrast allergy for which he will be premedicated with prednisone  (he has a listed allergy but discussed with him the reaction has been mild tachycardia) - will evaluate coagulopathy panel, protein C/S  FEN/GI: regular PPx: lovenox  Dispo:Home pending clinical improvement .   Subjective:  He reports he is about 90% improved at this time.  He has left sided face numbness that is residual since the last time this happened.  No other deficits specified. Sister has an uncertain prothrombotic disorder.  Objective: Temp:  [98 F (36.7 C)-99 F (37.2 C)] 98.1 F (36.7 C) (10/29 0304) Pulse Rate:  [63-95] 68 (10/29 0304) Resp:  [10-18] 17 (10/29 0304) BP: (103-141)/(65-88)  105/76 (10/29 0304) SpO2:  [96 %-100 %] 98 % (10/29 0304) Physical Exam: General: Well-appearing male, lying in bed with EEG attached Cardiovascular: RRR Respiratory: CTAB Neuro: No gross discrepancies in upper or lower extremity strength, cranial nerves intact aside from sensation in the V1,2, and 3 distributions on the left, finger-to-nose testing normal  Laboratory: Most recent CBC Lab Results  Component Value Date   WBC 8.1 06/30/2024   HGB 14.0 06/30/2024   HCT 42.0 06/30/2024   MCV 88.1 06/30/2024   PLT 261 06/30/2024   Most recent BMP    Latest Ref Rng & Units 06/30/2024    3:00 AM  BMP  Glucose 70 - 99 mg/dL 893   BUN 6 - 20 mg/dL 26   Creatinine 9.38 - 1.24 mg/dL 9.05   Sodium 864 - 854 mmol/L 136   Potassium 3.5 - 5.1 mmol/L 4.4   Chloride 98 - 111 mmol/L 101   CO2 22 - 32 mmol/L 26   Calcium 8.9 - 10.3 mg/dL 9.5    Vitamin A87 516 Folate 10.1 RPR nonreactive TSH 1.66 Mg 2.3  Imaging/Diagnostic Tests: CT head 06/29/24 Rads impression 1. No acute intracranial abnormality. 2. Mild mucosal thickening in the ethmoid air cells.  Alena Morrison, Elio, MD 06/30/2024, 7:30 AM  PGY-1, [facility] Family Medicine FPTS Intern pager: [phone], text pages welcome Secure chat group [facility] Teaching Service

## 2024-06-30 NOTE — Assessment & Plan Note (Addendum)
 Cardiology was consulted and recommended ongoing outpatient evaluation.  EEG without seizure activity.  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  -Neurology following, appreciate recs  -Deferring MRI given multiple in the past  -24-hour EEG, results above -Follow vitamin B1 -Telemetry -Awaiting orthostatics -Will get CTA chest today per cardiology, he has a contrast allergy for which he will be premedicated with prednisone  (he has a listed allergy but discussed with him the reaction has been mild tachycardia) - will evaluate coagulopathy panel, protein C/S

## 2024-07-01 ENCOUNTER — Other Ambulatory Visit (HOSPITAL_BASED_OUTPATIENT_CLINIC_OR_DEPARTMENT_OTHER): Payer: Self-pay

## 2024-07-01 ENCOUNTER — Emergency Department (HOSPITAL_BASED_OUTPATIENT_CLINIC_OR_DEPARTMENT_OTHER)
Admission: EM | Admit: 2024-07-01 | Discharge: 2024-07-01 | Disposition: A | Discharge status: Home / Self Care | Source: Ambulatory Visit | Attending: Emergency Medicine | Admitting: Emergency Medicine

## 2024-07-01 ENCOUNTER — Inpatient Hospital Stay (HOSPITAL_COMMUNITY)

## 2024-07-01 ENCOUNTER — Other Ambulatory Visit: Payer: Self-pay

## 2024-07-01 ENCOUNTER — Ambulatory Visit (HOSPITAL_COMMUNITY)

## 2024-07-01 ENCOUNTER — Encounter (HOSPITAL_BASED_OUTPATIENT_CLINIC_OR_DEPARTMENT_OTHER): Payer: Self-pay | Admitting: Emergency Medicine

## 2024-07-01 DIAGNOSIS — Q2112 Patent foramen ovale: Secondary | ICD-10-CM

## 2024-07-01 DIAGNOSIS — T7840XA Allergy, unspecified, initial encounter: Secondary | ICD-10-CM | POA: Insufficient documentation

## 2024-07-01 DIAGNOSIS — Z0389 Encounter for observation for other suspected diseases and conditions ruled out: Secondary | ICD-10-CM | POA: Diagnosis not present

## 2024-07-01 DIAGNOSIS — G9340 Encephalopathy, unspecified: Secondary | ICD-10-CM | POA: Diagnosis not present

## 2024-07-01 LAB — FIBRINOGEN: Fibrinogen: 265 mg/dL (ref 210–475)

## 2024-07-01 LAB — PLATELET COUNT: Platelets: 266 K/uL (ref 150–400)

## 2024-07-01 LAB — PROTIME-INR
INR: 1 (ref 0.8–1.2)
Prothrombin Time: 13.7 s (ref 11.4–15.2)

## 2024-07-01 LAB — APTT: aPTT: 25 s (ref 24–36)

## 2024-07-01 MED ORDER — PREDNISONE 50 MG PO TABS
50.0000 mg | ORAL_TABLET | Freq: Once | ORAL | Status: AC
  Administered 2024-07-01: 50 mg via ORAL
  Filled 2024-07-01: qty 1

## 2024-07-01 MED ORDER — FAMOTIDINE 20 MG PO TABS
20.0000 mg | ORAL_TABLET | Freq: Once | ORAL | Status: AC
  Administered 2024-07-01: 20 mg via ORAL
  Filled 2024-07-01: qty 1

## 2024-07-01 MED ORDER — PREDNISONE 50 MG PO TABS
50.0000 mg | ORAL_TABLET | Freq: Every day | ORAL | 0 refills | Status: AC
  Filled 2024-07-01: qty 4, 4d supply, fill #0

## 2024-07-01 MED ORDER — IOHEXOL 350 MG/ML SOLN
70.0000 mL | Freq: Once | INTRAVENOUS | Status: AC | PRN
Start: 2024-07-01 — End: 2024-07-01
  Administered 2024-07-01: 70 mL via INTRAVENOUS

## 2024-07-01 NOTE — ED Provider Notes (Signed)
 [place] EMERGENCY DEPARTMENT AT [facility] Provider Note   CSN: [id number] Arrival date & time: 07/01/24  1439     Patient presents with: Allergic Reaction   Manuel [name] is a 43 y.o. male.   The history is provided by the patient, the spouse and medical records. No language interpreter was used.  Allergic Reaction Presenting symptoms: itching, rash and swelling   Presenting symptoms: no difficulty breathing, no difficulty swallowing and no wheezing   Severity:  Moderate Duration:  3 hours Prior allergic episodes:  Allergies to medications Context: medications (had contrast overnight)   Relieved by:  Antihistamines Worsened by:  Nothing      Prior to Admission medications   Medication Sig Start Date End Date Taking? Authorizing Provider  acetaminophen  (TYLENOL ) 500 MG tablet Take 1,000 mg by mouth 2 (two) times daily as needed for headache or fever (pain).    Provider, Historical, [name]  albuterol  (VENTOLIN  HFA) 108 (90 Base) MCG/ACT inhaler Inhale 2 puffs into the lungs every 6 (six) hours as needed for shortness of breath or wheezing. 09/23/23 09/22/24  Provider, Historical, [name]  Ascorbic Acid  (VITAMIN C PO) Take 1 tablet by mouth daily.    Provider, Historical, [name]  BIOTIN PO Take 1 tablet by mouth daily.    Provider, Historical, [name]  Cholecalciferol (VITAMIN D-3 PO) Take 1 capsule by mouth daily.    Provider, Historical, [name]  Ferrous Sulfate (IRON PO) Take 1 tablet by mouth daily.    Provider, Historical, [name]  VITAMIN E PO Take 1 capsule by mouth daily.    Provider, Historical, [name]  VITAMIN K PO Take 1 tablet by mouth daily.    Provider, Historical, [name]    Allergies: Codeine, Ery-tab [erythromycin], Iodine, Neomycin, Optiray [ioversol], Shellfish allergy, Corn-containing products, Wheat, Zithromax [azithromycin], Vibramycin  [doxycycline  hyclate], Dilaudid [hydromorphone hcl], Nsaids, Prednisone , and Tetracyclines & related    Review of Systems  Constitutional:   Negative for chills, fatigue and fever.  HENT:  Negative for congestion and trouble swallowing.   Respiratory:  Negative for cough, chest tightness, shortness of breath and wheezing.   Cardiovascular:  Negative for chest pain and palpitations.  Gastrointestinal:  Negative for abdominal pain, constipation, diarrhea, nausea and vomiting.  Genitourinary:  Negative for dysuria and flank pain.  Musculoskeletal:  Negative for back pain, neck pain and neck stiffness.  Skin:  Positive for itching and rash.  Neurological:  Negative for dizziness, syncope (not today since discharge), weakness, light-headedness and headaches.  Psychiatric/Behavioral:  Negative for agitation and confusion.   All other systems reviewed and are negative.   Updated Vital Signs BP 126/82   Pulse 76   Temp 98.7 F (37.1 C) (Oral)   Resp 13   Wt 62.1 kg   SpO2 100%   BMI 19.66 kg/m   Physical Exam Vitals and nursing note reviewed.  Constitutional:      General: He is not in acute distress.    Appearance: He is well-developed. He is not ill-appearing, toxic-appearing or diaphoretic.  HENT:     Head: Normocephalic and atraumatic.     Nose: No congestion or rhinorrhea.     Mouth/Throat:     Mouth: Mucous membranes are moist.     Pharynx: No oropharyngeal exudate or posterior oropharyngeal erythema.  Eyes:     Extraocular Movements: Extraocular movements intact.     Conjunctiva/sclera: Conjunctivae normal.     Pupils: Pupils are equal, round, and reactive to light.  Neck:  Vascular: No carotid bruit.  Cardiovascular:     Rate and Rhythm: Normal rate and regular rhythm.     Heart sounds: No murmur heard. Pulmonary:     Effort: Pulmonary effort is normal. No respiratory distress.     Breath sounds: Normal breath sounds. No stridor. No wheezing, rhonchi or rales.  Chest:     Chest wall: No tenderness.  Abdominal:     Palpations: Abdomen is soft.     Tenderness: There is no abdominal tenderness. There is  no guarding or rebound.  Musculoskeletal:        General: No swelling or tenderness.     Cervical back: Neck supple. No tenderness.  Skin:    General: Skin is warm and dry.     Capillary Refill: Capillary refill takes less than 2 seconds.     Findings: Erythema present. No bruising.  Neurological:     Mental Status: He is alert. Mental status is at baseline.     Sensory: No sensory deficit.     Motor: No weakness.  Psychiatric:        Mood and Affect: Mood normal.     (all labs ordered are listed, but only abnormal results are displayed) Labs Reviewed - No data to display  EKG: None  Radiology: CT ANGIO CHEST AORTA W/CM & OR WO/CM Result Date: 07/01/2024 EXAM: CTA CHEST AORTA 07/01/2024 12:52:33 AM TECHNIQUE: CTA of the chest was performed without and with the administration of 70 mL of intravenous iohexol  (OMNIPAQUE ) 350 MG/ML injection. Multiplanar reformatted images are provided for review. MIP images are provided for review. Automated exposure control, iterative reconstruction, and/or weight based adjustment of the mA/kV was utilized to reduce the radiation dose to as low as reasonably achievable. COMPARISON: None available. CLINICAL HISTORY: Concern for pulmonary AVM. Concern for pulmonary AVM; 70 ml omni 350. Concern for pulmonary AVM. Concern for pulmonary AVM; 70 ml omni 350. FINDINGS: AORTA: No thoracic aortic dissection. No aneurysm. No atherosclerotic calcification within the thoracic aorta. MEDIASTINUM: No mediastinal lymphadenopathy. No significant coronary artery calcification. Global cardiac size within normal limits. No pericardial effusion. LYMPH NODES: No mediastinal, hilar or axillary lymphadenopathy. LUNGS AND PLEURA: There is suboptimal opacification of the pulmonary arterial tree due to bolus timing. Central pulmonary arteries are of normal caliber. No pulmonary or arteriovenous malformation identified. The lungs are without acute process. No focal consolidation or  pulmonary edema. No pleural effusion or pneumothorax. UPPER ABDOMEN: Limited images of the upper abdomen are unremarkable. SOFT TISSUES AND BONES: No acute bone or soft tissue abnormality. IMPRESSION: 1. No pulmonary arteriovenous malformation identified. Electronically signed by: Manuel [name] 07/01/2024 12:57 AM EDT RP Workstation: [id number]   Overnight EEG with video Result Date: 06/30/2024 Shelton Arlin KIDD, [name]     06/30/2024 10:02 AM Patient Name: XION DEBRUYNE [name] MRN: [id number] Epilepsy Attending: Arlin KIDD Shelton Referring Physician/Provider: Shelton Arlin KIDD, [name] Duration: 06/29/2024 1510 to 06/30/2024 0915 Patient history: 43 year old male with multiple medical comorbidities as noted in HPI presented after an episode of alteration of awareness. EEG to evaluate for seizure Level of alertness: Awake, asleep AEDs during EEG study: None Technical aspects: This EEG study was done with scalp electrodes positioned according to the 10-20 International system of electrode placement. Electrical activity was reviewed with band pass filter of 1-70Hz , sensitivity of 7 uV/mm, display speed of 48mm/sec with a 60Hz  notched filter applied as appropriate. EEG data were recorded continuously and digitally stored.  Video monitoring was available and reviewed as  appropriate. Description: The posterior dominant rhythm consists of 9 Hz activity of moderate voltage (25-35 uV) seen predominantly in posterior head regions, symmetric and reactive to eye opening and eye closing. Sleep was characterized by vertex waves, sleep spindles (12 to 14 Hz), maximal frontocentral region. Hyperventilation and photic stimulation were not performed.   IMPRESSION: This study is within normal limits. No seizures or epileptiform discharges were seen throughout the recording. A normal interictal EEG does not exclude the diagnosis of epilepsy. Manuel [name]     Procedures   Medications Ordered in the ED  famotidine  (PEPCID ) tablet  20 mg (has no administration in time range)  predniSONE  (DELTASONE ) tablet 50 mg (has no administration in time range)                                    Medical Decision Making Risk Prescription drug management.    Manuel [name] is a 43 y.o. male with a past medical history significant for previous testicular cancer, POTS, seizures, previous allergic reactions, and recent admission for syncope was discharged earlier today who presents with allergic reaction.  According to patient, he received a CT scan with contrast for which he was pretreated with his iodine allergy with Solu-Medrol and antihistamines but despite this he reports that just after noon today, he started having some rash on his face and then his chest.  He felt that his throat was feeling slightly thick and he was feeling some puffiness and swelling of his face.  He took Benadryl and the symptoms seem to have plateaued and possibly was started to improve but he presents for evaluation.  He reports has had many allergic reactions to things in the past and knows what reaction feels like for him.  He otherwise denies shortness of breath, chest pain, lightheadedness or recurrent syncope.  He denies any nausea vomiting.  He does report some diffuse pruritus and itching.  Denies other complaints at this time.  Vitals on my evaluation were reassuring without tachycardia, tachypnea, or hypertension.  He is afebrile and oxygen  saturations at 100% on room air.  Patient is well-appearing.  On exam, lungs clear with no wheezing.  No stridor.  Chest and abdomen nontender.  Back nontender.  No flank tenderness.  Minimal erythema seen that patient feels looks like urticaria for him.  Symmetric smile.  Clear speech.  Pupil symmetric and reactive normal extract movements.  No evidence of oropharyngeal rash.  No stridor.  Patient otherwise well-appearing.  We had a shared decision made conversation and given his reassuring vital signs and lack  of distress or pending throat closure we agreed to hold on EpiPen at this time.  We will however give him some more antihistamines to go with the Benadryl he had just before arrival and some prednisone .  We had a shared decision making conversation patient says that his prednisone  allergy in the chart is more of an intolerance and he gets anxious with it.  He does not want to do it.  Will give prednisone  and the Pepcid  and watch the patient for several hours.  If his symptoms remain improved, dissipate discharge home with a burst of steroids and PCP follow-up.  Patient agrees to this plan.  Anticipate medications and monitoring for the next few hours and if he still appears reassuring, anticipate discharge home.  Family agrees with this plan.  5:35 PM Patient reports symptoms have  completely resolved and he feels well and would like to go home now.  This is reasonable.  He will get a burst of steroids for the next few days and he has EpiPen at the bedside.  He will follow-up with his PCP and understood return precautions.  He had other questions or concerns and was discharged in good condition with improved symptoms.     Final diagnoses:  Allergic reaction, initial encounter    ED Discharge Orders          Ordered    predniSONE  (DELTASONE ) 50 MG tablet  Daily        07/01/24 1736           Clinical Impression: 1. Allergic reaction, initial encounter     Disposition: Discharge  Condition: Good  I have discussed the results, Dx and Tx plan with the pt(& family if present). He/she/they expressed understanding and agree(s) with the plan. Discharge instructions discussed at great length. Strict return precautions discussed and pt &/or family have verbalized understanding of the instructions. No further questions at time of discharge.    Current Discharge Medication List     START taking these medications   Details  predniSONE  (DELTASONE ) 50 MG tablet Take 1 tablet (50 mg total) by  mouth daily for 4 days. Qty: 4 tablet, Refills: 0        Follow Up: Beam, Lamar POUR, [name] [address] [place] [address] [phone]     [facility] Emergency Department at [facility] [address] [place]   [id number] [phone]        Lacoya Wilbanks, Lonni PARAS, [name] 07/01/24 [phone]

## 2024-07-01 NOTE — TOC Transition Note (Signed)
 Transition of Care [facility]) - Discharge Note   Patient Details  Name: Manuel [name] MRN: [id number] Date of Birth: 03-23-1981  Transition of Care [facility]) CM/SW Contact:  Andrez JULIANNA George, RN Phone Number: 07/01/2024, 12:35 PM   Clinical Narrative:     Pt is discharging home with self care. No needs per IP Care management.  Final next level of care: Home/Self Care Barriers to Discharge: No Barriers Identified   Patient Goals and CMS Choice            Discharge Placement                       Discharge Plan and Services Additional resources added to the After Visit Summary for                                       Social Drivers of Health (SDOH) Interventions SDOH Screenings   Food Insecurity: No Food Insecurity (06/30/2024)  Housing: Low Risk  (06/30/2024)  Transportation Needs: No Transportation Needs (06/30/2024)  Utilities: Not At Risk (06/30/2024)  Financial Resource Strain: Patient Declined (09/16/2023)   Received from [facility]  Physical Activity: Sufficiently Active (09/16/2023)   Received from [facility]  Social Connections: Moderately Integrated (09/16/2023)   Received from [facility]  Stress: Stress Concern Present (09/16/2023)   Received from [facility]  Tobacco Use: Low Risk  (06/22/2024)   Received from [facility]     Readmission Risk Interventions     No data to display

## 2024-07-01 NOTE — Progress Notes (Signed)
 Cardiology Note:   Chart reviewed Chest CTA without evidence fo pulmonary AVM  His neurological symptoms are not felt to be cardiac related.   Cardiology will sign off. Please call with questions.   Lonni Cash, MD, [facility] 07/01/2024 8:03 AM

## 2024-07-01 NOTE — Discharge Instructions (Signed)
 Your history, exam, and evaluation today seem consistent with an early allergic or anaphylactic reaction that we are able to stop and resolved here in the emergency department.  Please take the burst of steroids for the next 4 days starting tomorrow as you already had a dose today.  May also use antihistamines such as Pepcid  or Benadryl if you have more itching.  Please rest and stay hydrated and have your EpiPen with you.  Please follow-up with your primary doctor.  If any symptoms change or worsen acutely, please return to the nearest emergency department.

## 2024-07-01 NOTE — Discharge Summary (Addendum)
 Family Medicine Teaching [facility] Discharge Summary  Patient name: Manuel [name] Medical record number: [id number] Date of birth: 06-28-1981 Age: 43 y.o. 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 His hospital course is detailed below:  Altered Mental Status  Patient presented after an episode of near syncope and confusion. He has had episodes similar to these in the past with extensive workup with cardiology and neurology. This episode lasted longer than usual. Lasting multiple hours compared to less than an hour. On presentation, patient was confused, not fully oriented, without any new focal deficits (has history of left facial numbness). CT Head without contrast did not show any acute intracranial abnormality. Neurology was consulted and started 24 hr EEG. Patient was maintained on cardiac telemetry which was normal. Electrolytes and vitamin testing were within normal limits. TSH was normal. His cardiology team was consulted and did not believe that this presentation was related to his ongoing cardiac concerns regarding PFO, PAC or PVC. There was some discussion of Orthodeoxia platypnea syndrome, so CTA chest was done to evaluate for pulmonary AVMs, which was negative. Recommended further outpatient  follow up for exercise stress testing. EEG showed to have no epileptic activity. Neurology recommended increasing fluids, salt intake, compression stockings, and midodrine, and aspirin  81 for concern of microemboli. Due to patient's gastritis he is on low salt diet, and cannot take aspirin  but is considering the other interventions. Coagulopathy labs ordered for patient's concern of genetic coagulopathy and microemboli. Platelets, fibrinogen, APTT, INR normal.   Other chronic conditions were medically managed with home medications and formulary alternatives as necessary (None)  PCP Follow-up Recommendations: Follow up exercise stress test with cardiology  Follow up appointment with neurology specialist Dr. Rosemarie, for additional testing  Follow up Protein S total and activity, Protein C total and activity, Vitamin B1 levels.      Results/Tests Pending at Time of Discharge:  Unresulted Labs (From admission, onward)     Start     Ordered   07/01/24 0500  Protein C activity  Tomorrow morning,   R        06/30/24 1044   07/01/24 0500  Protein C, total  Tomorrow morning,   R        06/30/24 1044   07/01/24 0500  Protein S, total  Tomorrow morning,   R        06/30/24 1044   07/01/24 0500  Protein S activity  Tomorrow morning,   R        06/30/24 1044   07/01/24 0500  Protein S, Antigen, Free  Tomorrow morning,   R        06/30/24 1044   06/30/24 0500  Vitamin B1  Tomorrow morning,   R        06/29/24 1623             Disposition: Home  Discharge Condition: stable  Discharge Exam:  Vitals:   07/01/24 0105 07/01/24 0443  BP: 107/61 102/61  Pulse:  67  Resp:  16  Temp:  97.9 F (36.6 C)  SpO2:  96%   General: well appearing, in no acute distress CV: RRR, radial pulses equal and palpable, no BLE edema  Resp: Normal work of breathing on room air Abd: Soft, non tender, non distended  Neuro: Alert & Oriented x 4, decreased sensation to left side of face, EOMI, moving all 4  extremities equally. Sensation in upper and lower extremities intact.     Significant Procedures:   EEG This study is within normal limits. No seizures or epileptiform discharges were seen throughout the recording.   A normal interictal EEG does not exclude the diagnosis of epilepsy.   Significant Labs and Imaging:  Recent Labs  Lab 06/29/24 1204 06/30/24 0300 07/01/24 0133  WBC 7.5 8.1  --   HGB 15.0 14.0  --   HCT 45.9 42.0  --   PLT 305 261 266   Recent Labs  Lab 06/29/24 1204 06/29/24 1300 06/30/24 0300  NA 136  --  136  K 4.4  --  4.4  CL 101  --  101  CO2 25  --  26  GLUCOSE 102*  --  106*  BUN 25*  --  26*  CREATININE 0.96  --  0.94  CALCIUM 9.1  --  9.5  MG  --  2.3  --   ALKPHOS 66  --   --   AST 23  --   --   ALT 20  --   --   ALBUMIN 4.2  --   --     CT Head wo contrast  1. No acute intracranial abnormality. 2. Mild mucosal thickening in the ethmoid air cells.  CTA Chest  1. No pulmonary arteriovenous malformation identified.   Discharge Medications:  Allergies as of 07/01/2024       Reactions   Codeine Nausea And Vomiting   Ery-tab [erythromycin] Anaphylaxis, Hives, Itching   Iodine Anaphylaxis, Shortness Of Breath   Neomycin Anaphylaxis, Hives, Itching   Optiray [ioversol] Anaphylaxis   Shellfish Allergy Anaphylaxis   Corn-containing Products Nausea And Vomiting, Swelling   Wheat Nausea And Vomiting   Zithromax [azithromycin] Hives   Vibramycin  [doxycycline  Hyclate] Nausea And Vomiting   Dilaudid [hydromorphone Hcl] Nausea And Vomiting   Nsaids Swelling, Palpitations   Prednisone  Nausea And Vomiting, Anxiety   Tetracyclines & Related Rash        Medication List     STOP taking these medications    predniSONE  50 MG tablet Commonly known as: DELTASONE        TAKE these medications    acetaminophen  500 MG tablet Commonly known as: TYLENOL  Take 1,000 mg by mouth 2 (two) times daily as needed for headache or fever (pain).    albuterol  108 (90 Base) MCG/ACT inhaler Commonly known as: VENTOLIN  HFA Inhale 2 puffs into the lungs every 6 (six) hours as needed for shortness of breath or wheezing.   BIOTIN PO Take 1 tablet by mouth daily.   IRON PO Take 1 tablet by mouth daily.   VITAMIN C PO Take 1 tablet by mouth daily.   VITAMIN D-3 PO Take 1 capsule by mouth daily.   VITAMIN E PO Take 1 capsule by mouth daily.   VITAMIN K PO Take 1 tablet by mouth daily.        Discharge Instructions: Please  refer to Patient Instructions section of EMR for full details.  Patient was counseled important signs and symptoms that should prompt return to medical care, changes in medications, dietary instructions, activity restrictions, and follow up appointments.   Follow-Up Appointments: 07/16/2024 appointment with Gastroenterology at [facility]  Appointment with Dr. Rosemarie with Neurology yet to be scheduled.   Nicholas Bar, MD 07/01/2024, 7:41 AM PGY-3, [facility] Family Medicine

## 2024-07-01 NOTE — ED Notes (Signed)
 Pt has crackers at bedside.

## 2024-07-01 NOTE — ED Triage Notes (Signed)
 Pt endorses allergy to iodine, received contrast for CT at 0100 pretreated for allergic reaction with solumedrol and benadryl. Reports throat closing, burning under skin. No hives noted, pt reports hive on face. Assessed in triage by Ileana, PA. Took 50mg  benadryl at 30 minutes pta. Pt speaking in complete sentences, able to swallow secretions. Sates I feel inflammation in my throat. Symptoms started within last hour.

## 2024-07-01 NOTE — Progress Notes (Signed)
 Pt ready for DC, all lines out and DC summary reviewed. All questions answered and all belongings returned to patient.

## 2024-07-01 NOTE — ED Notes (Signed)
 Pt states he still feels the same not better or worse. He is able to keep food down.

## 2024-07-01 NOTE — ED Notes (Signed)
 Reviewed AVS/discharge instruction with patient. Time allotted for and all questions answered. Patient is agreeable for d/c and escorted to ed exit by staff.

## 2024-07-02 LAB — PROTEIN S, ANTIGEN, FREE: Protein S Ag, Free: 88 % (ref 61–136)

## 2024-07-02 LAB — PROTEIN S, TOTAL: Protein S Ag, Total: 82 % (ref 60–150)

## 2024-07-02 LAB — PROTEIN S ACTIVITY: Protein S Activity: 76 % (ref 63–140)

## 2024-07-02 LAB — PROTEIN C ACTIVITY: Protein C Activity: 122 % (ref 73–180)

## 2024-07-03 LAB — VITAMIN B1: Vitamin B1 (Thiamine): 135.9 nmol/L (ref 66.5–200.0)

## 2024-07-05 ENCOUNTER — Telehealth: Payer: Self-pay

## 2024-07-05 DIAGNOSIS — R202 Paresthesia of skin: Secondary | ICD-10-CM

## 2024-07-05 DIAGNOSIS — H539 Unspecified visual disturbance: Secondary | ICD-10-CM

## 2024-07-05 LAB — PROTEIN C, TOTAL: Protein C, Total: 104 % (ref 60–150)

## 2024-07-05 NOTE — Telephone Encounter (Signed)
 We received a call from this patient regarding his recent hospital stay for a syncopal episode. He is established with Dr Onita, LS 12/2023 and also [facility] 2025.  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 He requested a transcranial doppler US . Dr Shelton in the ED advised that Dr Rosemarie is the provider who would complete that testing.  He is asking for a provider switch from Dr Onita to Dr Rosemarie. Please advise.

## 2024-07-06 DIAGNOSIS — H547 Unspecified visual loss: Secondary | ICD-10-CM | POA: Diagnosis not present

## 2024-07-06 DIAGNOSIS — R202 Paresthesia of skin: Secondary | ICD-10-CM | POA: Diagnosis not present

## 2024-07-06 DIAGNOSIS — I7300 Raynaud's syndrome without gangrene: Secondary | ICD-10-CM | POA: Diagnosis not present

## 2024-07-06 DIAGNOSIS — H04123 Dry eye syndrome of bilateral lacrimal glands: Secondary | ICD-10-CM | POA: Diagnosis not present

## 2024-07-06 DIAGNOSIS — G629 Polyneuropathy, unspecified: Secondary | ICD-10-CM | POA: Diagnosis not present

## 2024-07-06 NOTE — Telephone Encounter (Signed)
Orders Placed This Encounter  Procedures  . VAS [place] TRANSCRANIAL DOPPLER

## 2024-07-08 ENCOUNTER — Ambulatory Visit: Admitting: Cardiology

## 2024-07-12 NOTE — Addendum Note (Signed)
 Addended by: Osiel Stick on: 07/12/2024 09:12 AM   Modules accepted: Orders

## 2024-07-15 ENCOUNTER — Encounter: Payer: Self-pay | Admitting: Neurology

## 2024-07-15 DIAGNOSIS — M51379 Other intervertebral disc degeneration, lumbosacral region without mention of lumbar back pain or lower extremity pain: Secondary | ICD-10-CM | POA: Diagnosis not present

## 2024-07-15 DIAGNOSIS — M5127 Other intervertebral disc displacement, lumbosacral region: Secondary | ICD-10-CM | POA: Diagnosis not present

## 2024-07-15 DIAGNOSIS — K2970 Gastritis, unspecified, without bleeding: Secondary | ICD-10-CM | POA: Diagnosis not present

## 2024-07-15 DIAGNOSIS — B9681 Helicobacter pylori [H. pylori] as the cause of diseases classified elsewhere: Secondary | ICD-10-CM | POA: Diagnosis not present

## 2024-07-16 DIAGNOSIS — R112 Nausea with vomiting, unspecified: Secondary | ICD-10-CM | POA: Diagnosis not present

## 2024-07-16 DIAGNOSIS — K2960 Other gastritis without bleeding: Secondary | ICD-10-CM | POA: Diagnosis not present

## 2024-07-16 DIAGNOSIS — R634 Abnormal weight loss: Secondary | ICD-10-CM | POA: Diagnosis not present

## 2024-07-16 DIAGNOSIS — Z008 Encounter for other general examination: Secondary | ICD-10-CM | POA: Diagnosis not present

## 2024-07-16 DIAGNOSIS — D509 Iron deficiency anemia, unspecified: Secondary | ICD-10-CM | POA: Diagnosis not present

## 2024-07-16 NOTE — Progress Notes (Signed)
 DIGESTIVE HEALTH OUTPATIENT NUTRITION ASSESSMENT  REFERRAL:              Referral for Medical Nutrition Therapy made by: Jenkins Minor, MD Reason for Referral, per provider documentation: abnormal weight loss, N/V, IDA, collagenous gastritis             PCP: No primary care provider on file.             Patient Age: 43 y.o. 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 He experienced significant weight loss of approximately 37 lbs, reaching the low 120s, prior to being diagnosed with H. pylori and SIBO/IMO. Following antibiotic therapy, he noted immediate improvement with resolution of GI symptoms (abdominal pain, bloating, diarrhea) and has since regained weight by increasing portions and prioritizing daily red meat intake, including liver. His diet consists primarily of gluten-free and organic foods; the household is gluten-free due to celiac disease in his wife and daughter. Additional history includes a diagnosis of small fiber neuropathy, which the patient attributes to prior malabsorption (vitamin E). He reports previous severe vitamin deficiencies, with some levels undetectable. Currently, he takes multiple high-dose, targeted single vitamins and has normalized most laboratory values. He believes migraines and neuropathy were related to malnutrition.  Pt reports he has been  restricted from strenuous exercise currently from MD with potential cardiac issues. Patient expresses a desire to optimize nutrient absorption from food alone and reduce pill burden.   GASTROINTESTINAL: DISGESTION and SYMPTOMS:  (07/16/2024)      Symptom Status: Resolved Bowel Movements: No complaints of chronic diarrhea or constipation per patient. BM frequency 1 times per day. GI Symptoms: Currently denies any GI symptoms  NUTRIENT INTAKE: Meal Pattern (most days):  3 meals a day, snacks 24h Breakfast Liver (5oz), GF cinnamon raisin bagel (1/2), cream cheese, 1C organic apple sauce, protein shake (hydrolyzed protein powder)  Lunch Chicken salad with pickles, mayo, greain free bread, broccoli, organic tater tots, apple, Junkless chewy granola bar  Dinner  Stirfy, white rice, sugar snap peas, zucchini, squash, green beans, sirloin and applesauce  Snack n/a  Diet/foods avoided/modified:  Patient reports symptoms, discomfort, or concern with the following foods and may restrict or avoid: gluten,eggs  Pt avoids the following foods due to IgE-mediated allergic reactions: nuts and seafood Nutrition supplements: Supplements include: HYDROLYZED PROTEIN Usual snacks: Pt uses snacks to supplement meal intake. Restaurant/take out/fast food: n/a   Usual Intake/Food Frequency:   (07/16/2024)  ( FOOD FREQ  PERIOD NOTES  fruits 3 per day   vegetables 3-4 per day   beans/legumes 2 per week   nuts 0 per week   whole grains 1-2 per day   fish 0 per week   poultry 1 per day   eggs 0 per week   red meat daily per day   milk  3 per day whole milk  calcium foods 2 per week cottage  cheese  water  128-144 oz. per day    RD noted and advised pt on potential health implications: High red meat intake noted; patient advised about high saturated fat content.  And excessive liver intake.   Vitamins/Minerals/Supplements:   Multivitamin (MVI): Pt reports:  denies daily MVI intake. Other vitamins/minerals or  herbal supplements pt currently reports taking:vitmain E complex, methyl folate, biotin, vitamin K, vitamin D (sublingual), vitamin C, B12, potassium citrate, calcium citrate, iron (childrens), alpha lipoic acid *RD recommends consideration of a multivitamin (MVI) for this patient 2/2   LIFESTYLE: Physical Activity: Pt is active and denies long periods of time being sedentary. Pt reports hx of physical activity Pt has current limitations that keep them from regularly planned exercise or activity Stress: Pt reports scaled average daily stress at 6 out of 10.  Sleep: Pt reports sleep as 8 hours per night; described quality as interrupted/hard to stay asleep.  BARRIERS: Potential Barriers:  malabsorption  Food Insecurity: No Food Insecurity (06/30/2024)   Received from [facility]   Food vital sign   . Within the past 12 months, you worried that your food would run out before you got money to buy more: Never true   . Within the past 12 months, the food you bought just didn't last and you didn't have money to get more: Never true    MEDICAL REVIEW: Medical History[1] Current Outpatient Medications  Medication Instructions  . albuterol  HFA (PROVENTIL  HFA;VENTOLIN  HFA;PROAIR  HFA) 90 mcg/actuation inhaler 2 puffs, Every 6 hours PRN  . budesonide  EC (ENTOCORT EC ) 9 mg, oral, Daily  . EPINEPHrine (EPIPEN) 0.3 mg/0.3 mL injection syringe   . gabapentin  (NEURONTIN ) 300 mg capsule   . sodium chloride  (AYR) 0.65 % drop nasal drops 1 spray  . ubrogepant  (Ubrelvy ) 50 mg tab tablet Take 1 tab at onset of migraine.  May repeat in 2 hrs, if needed.  Max dose: 2 tabs/day. This is a 30 day prescription.   GI and Other Imaging/Testing:  HBT pending   Last Reported Nutrition Related Labs: *altered nutrition related labs considered in this assessment Below is the patient's most recent value (if available) for ALT, AST, BUN, Calcium, Cholesterol, Creatinine, GFR, Glucose, HDL, Hemoglobin, Hemoglobin A1C,  LDL, Magnesium, Phosphorus, Potassium, Sodium,Triglycerides, Vitamin D, Vitamin B12, zinc  and copper . Lab Results  Component Value Date   CALPROTECTIN 129 (H) 02/17/2024   ALT 15 06/22/2024   AST 13 06/22/2024   BUN 22 06/22/2024   CALCIUM 9.4 06/22/2024   CREATININE 0.96 06/22/2024   EGFR >90 06/22/2024   HGB 14.3 06/17/2023   MG 2.1 10/21/2023   K 4.6 06/22/2024   NA 137 06/22/2024   VITD 22.1 (L) 10/21/2023   VITAMINB12 396 10/21/2023   Note: for a comprehensive list of the patient's lab results, access the Results Review activity.  ANTHROPOMETRICS: Wt Readings from Last 5 Encounters:  06/22/24 63 kg (139 lb)  04/20/24 59.5 kg (131 lb 3.2 oz)  02/17/24 61.6 kg (135 lb 11.2 oz)  10/21/23 68.1 kg (150 lb 3.2 oz)  05/06/23 65.5 kg (144 lb 4.8 oz)   Wt change: LOSS:  -19 lbs x 6 months (12.7%), rebound 8# gain  Estimated body mass index is 19.94 kg/m as calculated from the following:   Height as of 04/20/24: 1.778 m (5' 10).   Weight as of 06/22/24: 63 kg (139 lb). Pt's BMI falls within NORMAL limits for age. BMI is an oversimplification of body mass and health and does not consider a  person's body fat, fat distribution, or muscle mass.    Estimated Needs:     estimated needs based on last recorded height and weight  Adult nutritional needs based on: Wt:  Wt Readings from Last 1 Encounters:  06/22/24 63 kg (139 lb)      Ht:  Ht Readings from Last 1 Encounters:  04/20/24 1.778 m (5' 10)    Approximate Calories per day:  1826-2130 kcal, based on [place] St Jeor [1521] x 1.2-1.4    Approximate Protein per day: 91-114g, based on 20-25% total kcal  Approximate Total Fluid per day: 3700 mL (125 oz) for men. This recommendation includes fluids from water , other beverages, and food. Food typically contributes to ~20% of the daily fluid intake.    NUTRITION DIAGNOSIS:   Food and nutrition-related knowledge deficit related to lack of formal nutrition education for  Collagenous Gastritis/Malabsorption and gut health as evidenced by pt self reported dietary pattern (via recall). Status: new  NUTRITION INTERVENTIONS:  Nutrition Counseling:  Nutrition education and individualized recommendations were provided to address both gastrointestinal concerns and overall nutritional needs, including written and verbal instruction on dietary modifications, supplementation, and strategies to support symptom management and nutrient intake.  GUT MICROBIOME HEALTH, SIBO (small intestinal bacterial overgrowth), IMO (intestinal methanogen overgrowth), and MALABSORPTION  (07/16/2024)  Written handouts provided:   Addressing Chronic Stress, Boosting Calories and Protein, and Omega-3 Fatty Acid Foods List Gut Health, Stepwise Approach to Supporting Gut Health, Daily Gut Health Checklist, Fiber Supplements, and Gut Friendly Smoothies (Adding Calories)  Modifications to consider:   Education included evidence-based discussion and/or materials provided regarding important/necessary nutrients for optimal health, gut health and strategies to achieve favorable health outcomes  EATING PATTERN: Continue maintaining a regular, structured eating schedule with consistent meal timing throughout the day.  NUTRIENTS: DIETARY PROTEIN:  Continue prioritizing adequate protein intake to support your metabolic needs and overall nutritional goals. DIETARY FATS: Consider intentional increase in foods containing omega 3 fatty acids. Provided list containing marine and plant-based foods that are high in Omega-3s. SPECIFIC NUTRIENTS: Consider focusing on nutrients that may be inadequate in your current dietary pattern as discussed. HYDRATION: Continue focusing on adequate hydration through mostly water  intake.  WEIGHT MANAGEMENT: Continue to prioritize oral intake, incorporate nutritional supplements as needed, and support healthy weight gain or maintenance through sustainable lifestyle changes  and mindful eating habits  GASTROINTESTINAL: DIETARY PATTERN FOR GI SYMPTOMS: RD recommended an individualized, nutrient-dense eating pattern to support gut health, immune regulation, and overall nutritional status. Guidance emphasized: Reducing reliance on daily red meat and liver to minimize potential micronutrient excess (e.g., vitamin A , iron) and support microbiome diversity. Optimizing gut microbiome health through gradual inclusion of tolerated plant-based fibers and polyphenol-rich foods, tailored to symptom tolerance. Trialing MCT oil as a calorie-dense, easily absorbed fat source to aid weight maintenance without increasing GI burden. Ensuring adequate protein and micronutrient intake from a variety of sources while transitioning away from high-dose supplements toward food-based strategies. Education focused on gut barrier integrity, hydration, and balanced meals, along with lifestyle strategies for managing fatigue and inflammation. Individualized modifications based on symptom presentation, tolerance, and ongoing monitoring of nutritional status. GI SUPPLEMENTS: Provided handout on Gut-Friendly Smoothies, tailored to support nutrient intake across various GI conditions. Included recipes, gut friendly protein powder suggestions and links to additional information. and Consider probiotic Saccharomyces boulardii CNCM I-745 (i.e.FLORASTOR) for potential support in managing symptoms of SIBO, prevention of antibiotic-associated diarrhea, or reduction of recurrence risk in Clostridioides difficile infections. DIETARY  FIBER and GI ISSUES: Consider diversity in plant-based choices. Choose different sources of fiber (vegetable, fruits, beans, nuts, seeds, grain, herbs) and a variety of colors to maximize nutritional and gut health benefits.    LIFESTYLE: EXERCISE/ACTIVITY: Continue engaging in regular physical activity according to your ability and avoid inactivity if possible. When tolerated and  MD approved.  SUPPLEMENTS and ADDITIONAL THERAPIES: STRESS: Consider mitigating effects of stress by utilizing techniques shared in educational materials provided.    MONITORING AND EVALUATION:    Dietary Pattern: Pt is encouraged to monitor oral intake, following recommended MEDITERRANEAN diet pattern to meet estimated nutrition needs for optimal health.  Status: new  Body composition: Pt is encouraged to monitor anthropometric data with the goal of promoting intentional weight gain (current wt 139#).  Status: new  Physical activity:Pt is encouraged to move regularly and avoid long periods of sedentary behavior.  Status: new  Gastrointestinal: Pt is encouraged to monitor dietary intake, types and timing of foods with the goal of reduction or absence of symptoms.  Status: new  Knowledge/beliefs: Pt is encouraged to utilize educational materials provided to increase their baseline knowledge regarding nutrition which can inform choices to support a sustainable healthy lifestyle and potentially reduce symptoms. Status: new  Pt expressed understanding of today's assessment and discussion. RD provided educational materials and recommendations as requested. Follow-up Medical Nutrition Therapy is appropriate. RD encouraged follow up as needed, to monitor progress, provide support for diet changes, and to reassess nutrition needs/goals.  Provided contact information should questions or concerns arise. While this note has been reviewed, there may be occasional text or grammatical errors. Please do not hesitate to contact me if any part of this note remains unclear.  Thank you for this referral and for your support of Medical Nutrition Therapy.    Location Information: Patient State (at time of visit): [place]  Patient Location (at time of visit):Home/Other Non-Medical  Provider Location: Hospital/Provider-Based Clinic Is provider licensed to provide clinical care in the current  location/state of the patient? Yes   Consent:  Patient's identity was confirmed. Presenting condition or illness was discussed with the patient/personal representative. Current proposed treatment for presenting condition or illness was explained to patient/personal representative along with the likely benefits and any significant risks or complications associated with the provision of treatment by audio/video means. The patient/personal representative verbally authorized treatment to be provided by audio/video, which may include a limited review of patient's current health status, medication, or other treatment recommendations, patient education, and an opportunity to ask questions about condition and treatment. Verbal Consent Granted by Patient/Personal Representative:Yes   Visit Information: Modality: 2-Way Real-Time Audio/Video  Video Start Time: 11:31 Video Stop Time: 12:14 Video Total Time: 43 minutes       [1] Past Medical History: Diagnosis Date  . Collagenous gastritis 10/19/2022  . H/O testicular cancer 03/21/2022   Manuel [name] is a 43 y.o. male who presents for evaluation and discussion of post-treatment complications from his treatment of testicular cancer.      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 He final pathology showed a 3 cm tumor with lymphovascular invasion and negative margins. The final pathological   . Migraine 10/05/2015  . POTS (postural orthostatic tachycardia syndrome) 09/19/2020

## 2024-07-21 ENCOUNTER — Ambulatory Visit (HOSPITAL_COMMUNITY): Attending: Cardiology

## 2024-07-21 DIAGNOSIS — Q2112 Patent foramen ovale: Secondary | ICD-10-CM | POA: Insufficient documentation

## 2024-07-23 DIAGNOSIS — Q2112 Patent foramen ovale: Secondary | ICD-10-CM | POA: Diagnosis not present

## 2024-08-01 ENCOUNTER — Ambulatory Visit: Payer: Self-pay | Admitting: Cardiovascular Disease

## 2024-08-06 ENCOUNTER — Encounter: Payer: Self-pay | Admitting: Cardiovascular Disease

## 2024-08-09 DIAGNOSIS — M5126 Other intervertebral disc displacement, lumbar region: Secondary | ICD-10-CM | POA: Diagnosis not present

## 2024-08-17 DIAGNOSIS — H30033 Focal chorioretinal inflammation, peripheral, bilateral: Secondary | ICD-10-CM | POA: Diagnosis not present
